# Patient Record
Sex: Female | Born: 1950 | Race: White | Hispanic: No | Smoking: Current every day smoker
Health system: Southern US, Community
[De-identification: ages and names within clinical notes are randomized; demographics above are authoritative.]

## PROBLEM LIST (undated history)

## (undated) DIAGNOSIS — F32A Depression, unspecified: Secondary | ICD-10-CM

## (undated) DIAGNOSIS — H269 Unspecified cataract: Secondary | ICD-10-CM

## (undated) HISTORY — PX: ABDOMINAL HYSTERECTOMY: SHX81

## (undated) HISTORY — DX: Unspecified cataract: H26.9

## (undated) HISTORY — PX: CHOLECYSTECTOMY: SHX55

---

## 2006-02-19 ENCOUNTER — Ambulatory Visit: Payer: Self-pay

## 2009-10-13 ENCOUNTER — Emergency Department: Payer: Self-pay | Admitting: Emergency Medicine

## 2010-10-10 ENCOUNTER — Emergency Department: Payer: Self-pay | Admitting: Emergency Medicine

## 2020-12-28 ENCOUNTER — Inpatient Hospital Stay
Admission: EM | Admit: 2020-12-28 | Discharge: 2021-01-03 | DRG: 640 | Disposition: A | Discharge status: Home / Self Care | Payer: Medicare Other | Attending: Internal Medicine | Admitting: Internal Medicine

## 2020-12-28 ENCOUNTER — Encounter: Payer: Self-pay | Admitting: Emergency Medicine

## 2020-12-28 ENCOUNTER — Emergency Department: Payer: Medicare Other

## 2020-12-28 DIAGNOSIS — Z20822 Contact with and (suspected) exposure to covid-19: Secondary | ICD-10-CM | POA: Diagnosis present

## 2020-12-28 DIAGNOSIS — Z9049 Acquired absence of other specified parts of digestive tract: Secondary | ICD-10-CM

## 2020-12-28 DIAGNOSIS — E878 Other disorders of electrolyte and fluid balance, not elsewhere classified: Principal | ICD-10-CM

## 2020-12-28 DIAGNOSIS — F17210 Nicotine dependence, cigarettes, uncomplicated: Secondary | ICD-10-CM | POA: Diagnosis present

## 2020-12-28 DIAGNOSIS — Z5320 Procedure and treatment not carried out because of patient's decision for unspecified reasons: Secondary | ICD-10-CM | POA: Diagnosis present

## 2020-12-28 DIAGNOSIS — E871 Hypo-osmolality and hyponatremia: Secondary | ICD-10-CM | POA: Diagnosis present

## 2020-12-28 DIAGNOSIS — R251 Tremor, unspecified: Secondary | ICD-10-CM | POA: Diagnosis present

## 2020-12-28 DIAGNOSIS — F332 Major depressive disorder, recurrent severe without psychotic features: Secondary | ICD-10-CM | POA: Diagnosis not present

## 2020-12-28 DIAGNOSIS — E43 Unspecified severe protein-calorie malnutrition: Secondary | ICD-10-CM | POA: Diagnosis present

## 2020-12-28 DIAGNOSIS — Z8673 Personal history of transient ischemic attack (TIA), and cerebral infarction without residual deficits: Secondary | ICD-10-CM

## 2020-12-28 DIAGNOSIS — F419 Anxiety disorder, unspecified: Secondary | ICD-10-CM | POA: Diagnosis present

## 2020-12-28 DIAGNOSIS — R627 Adult failure to thrive: Secondary | ICD-10-CM | POA: Diagnosis present

## 2020-12-28 DIAGNOSIS — Z90710 Acquired absence of both cervix and uterus: Secondary | ICD-10-CM

## 2020-12-28 DIAGNOSIS — R778 Other specified abnormalities of plasma proteins: Secondary | ICD-10-CM | POA: Diagnosis present

## 2020-12-28 DIAGNOSIS — R296 Repeated falls: Secondary | ICD-10-CM | POA: Diagnosis present

## 2020-12-28 DIAGNOSIS — R64 Cachexia: Secondary | ICD-10-CM | POA: Diagnosis present

## 2020-12-28 DIAGNOSIS — R54 Age-related physical debility: Secondary | ICD-10-CM | POA: Diagnosis present

## 2020-12-28 DIAGNOSIS — R7989 Other specified abnormal findings of blood chemistry: Secondary | ICD-10-CM | POA: Diagnosis present

## 2020-12-28 DIAGNOSIS — F32A Depression, unspecified: Secondary | ICD-10-CM | POA: Diagnosis present

## 2020-12-28 DIAGNOSIS — E875 Hyperkalemia: Secondary | ICD-10-CM | POA: Diagnosis present

## 2020-12-28 DIAGNOSIS — E876 Hypokalemia: Secondary | ICD-10-CM | POA: Diagnosis present

## 2020-12-28 DIAGNOSIS — F22 Delusional disorders: Secondary | ICD-10-CM | POA: Diagnosis present

## 2020-12-28 DIAGNOSIS — W19XXXA Unspecified fall, initial encounter: Secondary | ICD-10-CM | POA: Diagnosis present

## 2020-12-28 DIAGNOSIS — F1010 Alcohol abuse, uncomplicated: Secondary | ICD-10-CM | POA: Diagnosis present

## 2020-12-28 DIAGNOSIS — I6930 Unspecified sequelae of cerebral infarction: Secondary | ICD-10-CM

## 2020-12-28 HISTORY — DX: Depression, unspecified: F32.A

## 2020-12-28 LAB — COMPREHENSIVE METABOLIC PANEL
ALT: 33 U/L (ref 0–44)
AST: 63 U/L — ABNORMAL HIGH (ref 15–41)
Albumin: 4.1 g/dL (ref 3.5–5.0)
Alkaline Phosphatase: 111 U/L (ref 38–126)
Anion gap: 14 (ref 5–15)
BUN: 6 mg/dL — ABNORMAL LOW (ref 8–23)
CO2: 26 mmol/L (ref 22–32)
Calcium: 8.9 mg/dL (ref 8.9–10.3)
Chloride: 82 mmol/L — ABNORMAL LOW (ref 98–111)
Creatinine, Ser: 0.63 mg/dL (ref 0.44–1.00)
GFR, Estimated: >60 mL/min (ref >60)
Glucose, Bld: 174 mg/dL — ABNORMAL HIGH (ref 70–99)
Potassium: <2 mmol/L — CL (ref 3.5–5.1)
Sodium: 122 mmol/L — ABNORMAL LOW (ref 135–145)
Total Bilirubin: 1.2 mg/dL (ref 0.3–1.2)
Total Protein: 7.6 g/dL (ref 6.5–8.1)

## 2020-12-28 LAB — CBC
HCT: 39.7 % (ref 36.0–46.0)
Hemoglobin: 14 g/dL (ref 12.0–15.0)
MCH: 32.9 pg (ref 26.0–34.0)
MCHC: 35.3 g/dL (ref 30.0–36.0)
MCV: 93.4 fL (ref 80.0–100.0)
Platelets: 271 10*3/uL (ref 150–400)
RBC: 4.25 MIL/uL (ref 3.87–5.11)
RDW: 11.9 % (ref 11.5–15.5)
WBC: 8.7 10*3/uL (ref 4.0–10.5)
nRBC: 0 % (ref 0.0–0.2)

## 2020-12-28 LAB — ETHANOL: Alcohol, Ethyl (B): <10 mg/dL (ref <10)

## 2020-12-28 LAB — PROTIME-INR
INR: 1 (ref 0.8–1.2)
Prothrombin Time: 12.9 seconds (ref 11.4–15.2)

## 2020-12-28 LAB — MAGNESIUM: Magnesium: 1.1 mg/dL — ABNORMAL LOW (ref 1.7–2.4)

## 2020-12-28 LAB — RESP PANEL BY RT-PCR (FLU A&B, COVID) ARPGX2
Influenza A by PCR: NEGATIVE
Influenza B by PCR: NEGATIVE
SARS Coronavirus 2 by RT PCR: NEGATIVE

## 2020-12-28 LAB — ACETAMINOPHEN LEVEL: Acetaminophen (Tylenol), Serum: <10 ug/mL — ABNORMAL LOW (ref 10–30)

## 2020-12-28 LAB — TSH: TSH: 2.511 u[IU]/mL (ref 0.350–4.500)

## 2020-12-28 LAB — PHOSPHORUS: Phosphorus: 2.3 mg/dL — ABNORMAL LOW (ref 2.5–4.6)

## 2020-12-28 LAB — APTT: aPTT: 34 seconds (ref 24–36)

## 2020-12-28 LAB — SALICYLATE LEVEL: Salicylate Lvl: <7 mg/dL — ABNORMAL LOW (ref 7.0–30.0)

## 2020-12-28 LAB — TROPONIN I (HIGH SENSITIVITY): Troponin I (High Sensitivity): 18 ng/L — ABNORMAL HIGH (ref <18)

## 2020-12-28 MED ORDER — POTASSIUM & SODIUM PHOSPHATES 280-160-250 MG PO PACK
2.0000 | PACK | Freq: Three times a day (TID) | ORAL | Status: AC
  Administered 2020-12-29 (×2): 2 via ORAL
  Filled 2020-12-28 (×2): qty 2

## 2020-12-28 MED ORDER — POTASSIUM CHLORIDE 10 MEQ/100ML IV SOLN
10.0000 meq | INTRAVENOUS | Status: AC
  Administered 2020-12-28 (×2): 10 meq via INTRAVENOUS
  Filled 2020-12-28 (×2): qty 100

## 2020-12-28 MED ORDER — MAGNESIUM SULFATE IN D5W 1-5 GM/100ML-% IV SOLN
1.0000 g | Freq: Once | INTRAVENOUS | Status: AC
  Administered 2020-12-28: 1 g via INTRAVENOUS
  Filled 2020-12-28: qty 100

## 2020-12-28 MED ORDER — SODIUM CHLORIDE 0.9 % IV BOLUS
250.0000 mL | Freq: Once | INTRAVENOUS | Status: AC
  Administered 2020-12-28: 250 mL via INTRAVENOUS

## 2020-12-28 MED ORDER — ACETAMINOPHEN 650 MG RE SUPP
650.0000 mg | Freq: Four times a day (QID) | RECTAL | Status: AC | PRN
  Filled 2020-12-28: qty 1

## 2020-12-28 MED ORDER — ONDANSETRON HCL 4 MG/2ML IJ SOLN: 4.0000 mg | Freq: Four times a day (QID) | INTRAMUSCULAR | Status: DC | PRN

## 2020-12-28 MED ORDER — ONDANSETRON HCL 4 MG PO TABS: 4.0000 mg | ORAL_TABLET | Freq: Four times a day (QID) | ORAL | Status: DC | PRN

## 2020-12-28 MED ORDER — SODIUM PHOSPHATES 45 MMOLE/15ML IV SOLN
15.0000 mmol | Freq: Once | INTRAVENOUS | Status: AC
  Administered 2020-12-29: 15 mmol via INTRAVENOUS
  Filled 2020-12-28: qty 5

## 2020-12-28 MED ORDER — MAGNESIUM SULFATE 2 GM/50ML IV SOLN
2.0000 g | Freq: Once | INTRAVENOUS | Status: AC
  Administered 2020-12-28: 2 g via INTRAVENOUS
  Filled 2020-12-28: qty 50

## 2020-12-28 MED ORDER — SODIUM CHLORIDE 0.9 % IV SOLN: INTRAVENOUS | Status: AC

## 2020-12-28 MED ORDER — LORAZEPAM 2 MG/ML IJ SOLN: 2.0000 mg | INTRAMUSCULAR | Status: AC | PRN

## 2020-12-28 MED ORDER — ACETAMINOPHEN 500 MG PO TABS
1000.0000 mg | ORAL_TABLET | Freq: Four times a day (QID) | ORAL | Status: AC | PRN
  Administered 2020-12-29 – 2020-12-31 (×3): 1000 mg via ORAL
  Filled 2020-12-28 (×3): qty 2

## 2020-12-28 MED ORDER — LORAZEPAM 2 MG/ML IJ SOLN: 2.0000 mg | Freq: Two times a day (BID) | INTRAMUSCULAR | Status: DC | PRN

## 2020-12-28 MED ORDER — HEPARIN SODIUM (PORCINE) 5000 UNIT/ML IJ SOLN
5000.0000 [IU] | Freq: Three times a day (TID) | INTRAMUSCULAR | Status: DC
  Administered 2020-12-29 – 2021-01-02 (×12): 5000 [IU] via SUBCUTANEOUS
  Filled 2020-12-28 (×15): qty 1
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
## 2020-12-29 ENCOUNTER — Encounter: Payer: Self-pay | Admitting: Internal Medicine

## 2020-12-29 DIAGNOSIS — I6930 Unspecified sequelae of cerebral infarction: Secondary | ICD-10-CM

## 2020-12-29 DIAGNOSIS — F419 Anxiety disorder, unspecified: Secondary | ICD-10-CM | POA: Diagnosis not present

## 2020-12-29 DIAGNOSIS — F32A Depression, unspecified: Secondary | ICD-10-CM

## 2020-12-29 DIAGNOSIS — E878 Other disorders of electrolyte and fluid balance, not elsewhere classified: Secondary | ICD-10-CM | POA: Diagnosis not present

## 2020-12-29 DIAGNOSIS — E43 Unspecified severe protein-calorie malnutrition: Secondary | ICD-10-CM | POA: Diagnosis present

## 2020-12-29 DIAGNOSIS — F1010 Alcohol abuse, uncomplicated: Secondary | ICD-10-CM | POA: Diagnosis not present

## 2020-12-29 LAB — URINE DRUG SCREEN, QUALITATIVE (ARMC ONLY)
Amphetamines, Ur Screen: NOT DETECTED
Barbiturates, Ur Screen: NOT DETECTED
Benzodiazepine, Ur Scrn: NOT DETECTED
Cannabinoid 50 Ng, Ur ~~LOC~~: NOT DETECTED
Cocaine Metabolite,Ur ~~LOC~~: NOT DETECTED
MDMA (Ecstasy)Ur Screen: NOT DETECTED
Methadone Scn, Ur: NOT DETECTED
Opiate, Ur Screen: NOT DETECTED
Phencyclidine (PCP) Ur S: NOT DETECTED
Tricyclic, Ur Screen: NOT DETECTED

## 2020-12-29 LAB — BASIC METABOLIC PANEL
Anion gap: 10 (ref 5–15)
Anion gap: 9 (ref 5–15)
Anion gap: 9 (ref 5–15)
BUN: 6 mg/dL — ABNORMAL LOW (ref 8–23)
BUN: <5 mg/dL — ABNORMAL LOW (ref 8–23)
BUN: <5 mg/dL — ABNORMAL LOW (ref 8–23)
CO2: 26 mmol/L (ref 22–32)
CO2: 27 mmol/L (ref 22–32)
CO2: 29 mmol/L (ref 22–32)
Calcium: 7.3 mg/dL — ABNORMAL LOW (ref 8.9–10.3)
Calcium: 7.5 mg/dL — ABNORMAL LOW (ref 8.9–10.3)
Calcium: 8.1 mg/dL — ABNORMAL LOW (ref 8.9–10.3)
Chloride: 86 mmol/L — ABNORMAL LOW (ref 98–111)
Chloride: 90 mmol/L — ABNORMAL LOW (ref 98–111)
Chloride: 91 mmol/L — ABNORMAL LOW (ref 98–111)
Creatinine, Ser: 0.41 mg/dL — ABNORMAL LOW (ref 0.44–1.00)
Creatinine, Ser: 0.5 mg/dL (ref 0.44–1.00)
Creatinine, Ser: 0.61 mg/dL (ref 0.44–1.00)
GFR, Estimated: >60 mL/min (ref >60)
GFR, Estimated: >60 mL/min (ref >60)
GFR, Estimated: >60 mL/min (ref >60)
Glucose, Bld: 122 mg/dL — ABNORMAL HIGH (ref 70–99)
Glucose, Bld: 128 mg/dL — ABNORMAL HIGH (ref 70–99)
Glucose, Bld: 129 mg/dL — ABNORMAL HIGH (ref 70–99)
Potassium: 2.4 mmol/L — CL (ref 3.5–5.1)
Potassium: 2.6 mmol/L — CL (ref 3.5–5.1)
Potassium: 3.2 mmol/L — ABNORMAL LOW (ref 3.5–5.1)
Sodium: 124 mmol/L — ABNORMAL LOW (ref 135–145)
Sodium: 126 mmol/L — ABNORMAL LOW (ref 135–145)
Sodium: 127 mmol/L — ABNORMAL LOW (ref 135–145)

## 2020-12-29 LAB — URINALYSIS, COMPLETE (UACMP) WITH MICROSCOPIC
Bacteria, UA: NONE SEEN
Glucose, UA: NEGATIVE mg/dL
Hgb urine dipstick: NEGATIVE
Ketones, ur: NEGATIVE mg/dL
Nitrite: NEGATIVE
Protein, ur: NEGATIVE mg/dL
Specific Gravity, Urine: 1.01 (ref 1.005–1.030)
pH: 7 (ref 5.0–8.0)

## 2020-12-29 LAB — CBC
HCT: 33.5 % — ABNORMAL LOW (ref 36.0–46.0)
Hemoglobin: 12 g/dL (ref 12.0–15.0)
MCH: 34.1 pg — ABNORMAL HIGH (ref 26.0–34.0)
MCHC: 35.8 g/dL (ref 30.0–36.0)
MCV: 95.2 fL (ref 80.0–100.0)
Platelets: 211 10*3/uL (ref 150–400)
RBC: 3.52 MIL/uL — ABNORMAL LOW (ref 3.87–5.11)
RDW: 12.2 % (ref 11.5–15.5)
WBC: 7.2 10*3/uL (ref 4.0–10.5)
nRBC: 0 % (ref 0.0–0.2)

## 2020-12-29 LAB — VITAMIN B12: Vitamin B-12: 985 pg/mL — ABNORMAL HIGH (ref 180–914)

## 2020-12-29 LAB — LIPID PANEL
Cholesterol: 130 mg/dL (ref 0–200)
HDL: 45 mg/dL (ref >40)
LDL Cholesterol: 67 mg/dL (ref 0–99)
Total CHOL/HDL Ratio: 2.9 RATIO
Triglycerides: 89 mg/dL (ref <150)
VLDL: 18 mg/dL (ref 0–40)

## 2020-12-29 LAB — TROPONIN I (HIGH SENSITIVITY): Troponin I (High Sensitivity): 18 ng/L — ABNORMAL HIGH (ref <18)

## 2020-12-29 MED ORDER — NICOTINE 21 MG/24HR TD PT24
21.0000 mg | MEDICATED_PATCH | Freq: Every day | TRANSDERMAL | Status: DC
  Administered 2020-12-29 – 2021-01-03 (×6): 21 mg via TRANSDERMAL
  Filled 2020-12-29 (×6): qty 1

## 2020-12-29 MED ORDER — POTASSIUM CHLORIDE 10 MEQ/100ML IV SOLN
10.0000 meq | INTRAVENOUS | Status: AC
  Administered 2020-12-29 (×4): 10 meq via INTRAVENOUS
  Filled 2020-12-29 (×4): qty 100

## 2020-12-29 MED ORDER — K PHOS MONO-SOD PHOS DI & MONO 155-852-130 MG PO TABS
500.0000 mg | ORAL_TABLET | ORAL | Status: AC
  Administered 2020-12-29 – 2020-12-30 (×2): 500 mg via ORAL
  Filled 2020-12-29 (×2): qty 2

## 2020-12-29 MED ORDER — CALCIUM CARBONATE 1250 (500 CA) MG PO TABS
1000.0000 mg | ORAL_TABLET | ORAL | Status: AC
  Administered 2020-12-29: 1000 mg via ORAL
  Filled 2020-12-29: qty 1

## 2020-12-29 MED ORDER — MAGNESIUM SULFATE 4 GM/100ML IV SOLN
4.0000 g | Freq: Once | INTRAVENOUS | Status: DC
  Filled 2020-12-29: qty 100

## 2020-12-29 NOTE — Assessment & Plan Note (Signed)
Due to poor p.o. intake.  Dietitian consult

## 2020-12-29 NOTE — Assessment & Plan Note (Signed)
Aggressively replating all electrolytes as needed and recheck

## 2020-12-29 NOTE — ED Notes (Signed)
Report messaged to fidila b rn floor nurse

## 2020-12-29 NOTE — Progress Notes (Signed)
MD notified of critical potassium of 2.4

## 2020-12-29 NOTE — Assessment & Plan Note (Signed)
Appreciate psych input.  Plan for transfer to general psych once the bed is available.  Patient is medically cleared.  Continue Ativan as needed

## 2020-12-29 NOTE — Assessment & Plan Note (Signed)
Replete and recheck ?

## 2020-12-29 NOTE — Consult Note (Signed)
PHARMACY CONSULT NOTE - FOLLOW UP  Pharmacy Consult for Electrolyte Monitoring and Replacement   Recent Labs: Potassium (mmol/L)  Date Value  12/29/2020 2.4 (LL)   Magnesium (mg/dL)  Date Value  40/08/6759 1.1 (L)   Calcium (mg/dL)  Date Value  95/09/3265 7.5 (L)   Albumin (g/dL)  Date Value  12/45/8099 4.1   Phosphorus (mg/dL)  Date Value  83/38/2505 2.3 (L)   Sodium (mmol/L)  Date Value  12/29/2020 127 (L)    Assessment: Patient with PMH relevant to depression, and alcohol abuse. Admitted with generalized weakness and depression. Pharmacy consulted to manage electrolytes. Potassium extremely low at 2.4, Magnesium low at 1.1, phos below goal at 2.3 and Calcium also below goal at 7.5.   Goal of Therapy:  Electrolytes within normal limits.  Plan:  Will replace phos with oral Kphos neutra 500mg  q4hr x 2 doses Kcl IV x 1hr x 6 doses Calcium carbonate tables 1000mg  x 1 Will also order MgSulfate 4gm x 1 Will defer additional sodium management to MD Repeat labs tomorrow morning for further assessment.  Kharter Brew Rodriguez-Guzman PharmD, BCPS 12/29/2020 4:18 PM

## 2020-12-29 NOTE — Assessment & Plan Note (Signed)
Counseled

## 2020-12-29 NOTE — BH Assessment (Signed)
Referral checks:    [facility] [phone]), No answer/ability to leave voicemail.    [facility] [phone]) [phone] Per Morrie Sheldon, pt was denied due to inability to detox and failure to thrive dx.    [place] [phone]), Left a voicemail.   [place] [phone] or [phone]) Left a voicemail.   [address] [phone]), No answer   [facility] [phone] or [phone]), No intake staff available until after 8 AM tomorrow.    [facility] [phone]). No answer  [facility] geropsych beds available at this time.

## 2020-12-29 NOTE — Assessment & Plan Note (Signed)
Due to poor p.o. intake.  Monitor with IV hydration

## 2020-12-29 NOTE — Hospital Course (Addendum)
70 year old female with a known history of depression, alcohol abuse and dependence is admitted for generalized weakness and depression.  She was found to have severe electrolyte imbalances for which she was admitted to medical service  12/1-replacing electrolytes 12/2: Potassium still running low and aggressively being replaced.  Waiting for Geropsych bed once medically clear

## 2020-12-29 NOTE — ED Notes (Signed)
Pt transferred to CPOD via ED tech. Sent with 1 bag of belongings.
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
## 2020-12-29 NOTE — Progress Notes (Signed)
Lab notified of STAT BMP to be collected

## 2020-12-29 NOTE — BH Assessment (Signed)
Referral information for Psychiatric Hospitalization faxed to;   [facility] [phone]),   [facility] [phone]) [phone]  [facility] Pl[facility] [phone]),  [facility] [phone] or [phone])  [address] [phone]),   [facility] [phone] or [phone]),   [facility] [phone]).

## 2020-12-30 ENCOUNTER — Other Ambulatory Visit: Payer: Self-pay

## 2020-12-30 DIAGNOSIS — Z20822 Contact with and (suspected) exposure to covid-19: Secondary | ICD-10-CM | POA: Diagnosis present

## 2020-12-30 DIAGNOSIS — E875 Hyperkalemia: Secondary | ICD-10-CM | POA: Diagnosis present

## 2020-12-30 DIAGNOSIS — F332 Major depressive disorder, recurrent severe without psychotic features: Secondary | ICD-10-CM | POA: Diagnosis present

## 2020-12-30 DIAGNOSIS — E876 Hypokalemia: Secondary | ICD-10-CM | POA: Diagnosis present

## 2020-12-30 DIAGNOSIS — F17210 Nicotine dependence, cigarettes, uncomplicated: Secondary | ICD-10-CM | POA: Diagnosis present

## 2020-12-30 DIAGNOSIS — W19XXXA Unspecified fall, initial encounter: Secondary | ICD-10-CM | POA: Diagnosis present

## 2020-12-30 DIAGNOSIS — R627 Adult failure to thrive: Secondary | ICD-10-CM | POA: Diagnosis present

## 2020-12-30 DIAGNOSIS — Z8673 Personal history of transient ischemic attack (TIA), and cerebral infarction without residual deficits: Secondary | ICD-10-CM | POA: Diagnosis not present

## 2020-12-30 DIAGNOSIS — R64 Cachexia: Secondary | ICD-10-CM | POA: Diagnosis present

## 2020-12-30 DIAGNOSIS — Z5320 Procedure and treatment not carried out because of patient's decision for unspecified reasons: Secondary | ICD-10-CM | POA: Diagnosis present

## 2020-12-30 DIAGNOSIS — I6930 Unspecified sequelae of cerebral infarction: Secondary | ICD-10-CM | POA: Diagnosis not present

## 2020-12-30 DIAGNOSIS — F1010 Alcohol abuse, uncomplicated: Secondary | ICD-10-CM | POA: Diagnosis present

## 2020-12-30 DIAGNOSIS — Z9049 Acquired absence of other specified parts of digestive tract: Secondary | ICD-10-CM | POA: Diagnosis not present

## 2020-12-30 DIAGNOSIS — Z90710 Acquired absence of both cervix and uterus: Secondary | ICD-10-CM | POA: Diagnosis not present

## 2020-12-30 DIAGNOSIS — R296 Repeated falls: Secondary | ICD-10-CM | POA: Diagnosis present

## 2020-12-30 DIAGNOSIS — R54 Age-related physical debility: Secondary | ICD-10-CM | POA: Diagnosis present

## 2020-12-30 DIAGNOSIS — E878 Other disorders of electrolyte and fluid balance, not elsewhere classified: Secondary | ICD-10-CM | POA: Diagnosis present

## 2020-12-30 DIAGNOSIS — F32A Depression, unspecified: Secondary | ICD-10-CM | POA: Diagnosis not present

## 2020-12-30 DIAGNOSIS — F419 Anxiety disorder, unspecified: Secondary | ICD-10-CM | POA: Diagnosis present

## 2020-12-30 DIAGNOSIS — R251 Tremor, unspecified: Secondary | ICD-10-CM | POA: Diagnosis present

## 2020-12-30 DIAGNOSIS — E871 Hypo-osmolality and hyponatremia: Secondary | ICD-10-CM | POA: Diagnosis present

## 2020-12-30 DIAGNOSIS — E43 Unspecified severe protein-calorie malnutrition: Secondary | ICD-10-CM | POA: Diagnosis present

## 2020-12-30 DIAGNOSIS — F22 Delusional disorders: Secondary | ICD-10-CM | POA: Diagnosis present

## 2020-12-30 LAB — BASIC METABOLIC PANEL
Anion gap: 8 (ref 5–15)
Anion gap: 8 (ref 5–15)
BUN: <5 mg/dL — ABNORMAL LOW (ref 8–23)
BUN: <5 mg/dL — ABNORMAL LOW (ref 8–23)
CO2: 27 mmol/L (ref 22–32)
CO2: 27 mmol/L (ref 22–32)
Calcium: 7.6 mg/dL — ABNORMAL LOW (ref 8.9–10.3)
Calcium: 7.7 mg/dL — ABNORMAL LOW (ref 8.9–10.3)
Chloride: 95 mmol/L — ABNORMAL LOW (ref 98–111)
Chloride: 93 mmol/L — ABNORMAL LOW (ref 98–111)
Creatinine, Ser: 0.48 mg/dL (ref 0.44–1.00)
Creatinine, Ser: 0.41 mg/dL — ABNORMAL LOW (ref 0.44–1.00)
GFR, Estimated: >60 mL/min (ref >60)
GFR, Estimated: >60 mL/min (ref >60)
Glucose, Bld: 129 mg/dL — ABNORMAL HIGH (ref 70–99)
Glucose, Bld: 132 mg/dL — ABNORMAL HIGH (ref 70–99)
Potassium: 2.8 mmol/L — ABNORMAL LOW (ref 3.5–5.1)
Potassium: 2.5 mmol/L — CL (ref 3.5–5.1)
Sodium: 130 mmol/L — ABNORMAL LOW (ref 135–145)
Sodium: 128 mmol/L — ABNORMAL LOW (ref 135–145)

## 2020-12-30 LAB — CBC
HCT: 32.3 % — ABNORMAL LOW (ref 36.0–46.0)
Hemoglobin: 11.3 g/dL — ABNORMAL LOW (ref 12.0–15.0)
MCH: 33 pg (ref 26.0–34.0)
MCHC: 35 g/dL (ref 30.0–36.0)
MCV: 94.4 fL (ref 80.0–100.0)
Platelets: 217 10*3/uL (ref 150–400)
RBC: 3.42 MIL/uL — ABNORMAL LOW (ref 3.87–5.11)
RDW: 11.9 % (ref 11.5–15.5)
WBC: 5.2 10*3/uL (ref 4.0–10.5)
nRBC: 0 % (ref 0.0–0.2)

## 2020-12-30 LAB — MAGNESIUM
Magnesium: 1.5 mg/dL — ABNORMAL LOW (ref 1.7–2.4)
Magnesium: 2.2 mg/dL (ref 1.7–2.4)

## 2020-12-30 LAB — PHOSPHORUS
Phosphorus: 2.8 mg/dL (ref 2.5–4.6)
Phosphorus: 2.7 mg/dL (ref 2.5–4.6)

## 2020-12-30 LAB — POTASSIUM: Potassium: 2.7 mmol/L — CL (ref 3.5–5.1)

## 2020-12-30 MED ORDER — POTASSIUM CHLORIDE CRYS ER 20 MEQ PO TBCR
40.0000 meq | EXTENDED_RELEASE_TABLET | Freq: Once | ORAL | Status: AC
  Administered 2020-12-30: 40 meq via ORAL
  Filled 2020-12-30: qty 2

## 2020-12-30 MED ORDER — POTASSIUM CHLORIDE CRYS ER 20 MEQ PO TBCR
40.0000 meq | EXTENDED_RELEASE_TABLET | ORAL | Status: AC
  Administered 2020-12-30 (×2): 40 meq via ORAL
  Filled 2020-12-30 (×2): qty 2

## 2020-12-30 MED ORDER — POTASSIUM CHLORIDE 10 MEQ/100ML IV SOLN
10.0000 meq | INTRAVENOUS | Status: DC
  Filled 2020-12-30 (×3): qty 100

## 2020-12-30 MED ORDER — POTASSIUM CHLORIDE CRYS ER 20 MEQ PO TBCR: 40.0000 meq | EXTENDED_RELEASE_TABLET | Freq: Three times a day (TID) | ORAL | Status: DC

## 2020-12-30 MED ORDER — ENSURE ENLIVE PO LIQD
237.0000 mL | Freq: Two times a day (BID) | ORAL | Status: DC
  Administered 2020-12-30 – 2021-01-03 (×9): 237 mL via ORAL

## 2020-12-30 MED ORDER — FOLIC ACID 1 MG PO TABS
1.0000 mg | ORAL_TABLET | Freq: Every day | ORAL | Status: DC
  Administered 2020-12-30 – 2021-01-03 (×5): 1 mg via ORAL
  Filled 2020-12-30 (×5): qty 1

## 2020-12-30 MED ORDER — CALCIUM CARBONATE 1250 (500 CA) MG PO TABS
1000.0000 mg | ORAL_TABLET | Freq: Once | ORAL | Status: AC
  Administered 2020-12-30: 1000 mg via ORAL
  Filled 2020-12-30 (×2): qty 2

## 2020-12-30 MED ORDER — POTASSIUM CHLORIDE 10 MEQ/100ML IV SOLN
10.0000 meq | INTRAVENOUS | Status: AC
  Administered 2020-12-30: 10 meq via INTRAVENOUS
  Filled 2020-12-30: qty 100

## 2020-12-30 MED ORDER — POTASSIUM CHLORIDE CRYS ER 20 MEQ PO TBCR
40.0000 meq | EXTENDED_RELEASE_TABLET | Freq: Three times a day (TID) | ORAL | Status: DC
  Administered 2020-12-30 – 2020-12-31 (×5): 40 meq via ORAL
  Filled 2020-12-30 (×5): qty 2

## 2020-12-30 MED ORDER — MAGNESIUM SULFATE 2 GM/50ML IV SOLN
2.0000 g | Freq: Once | INTRAVENOUS | Status: AC
  Administered 2020-12-30: 2 g via INTRAVENOUS
  Filled 2020-12-30: qty 50

## 2020-12-30 MED ORDER — POTASSIUM CHLORIDE CRYS ER 20 MEQ PO TBCR
40.0000 meq | EXTENDED_RELEASE_TABLET | Freq: Once | ORAL | Status: AC
  Administered 2020-12-31: 40 meq via ORAL
  Filled 2020-12-30: qty 2

## 2020-12-30 MED ORDER — ADULT MULTIVITAMIN W/MINERALS CH
1.0000 | ORAL_TABLET | Freq: Every day | ORAL | Status: DC
  Administered 2020-12-30 – 2021-01-03 (×5): 1 via ORAL
  Filled 2020-12-30 (×5): qty 1

## 2020-12-30 MED ORDER — THIAMINE HCL 100 MG PO TABS
100.0000 mg | ORAL_TABLET | Freq: Every day | ORAL | Status: DC
  Administered 2020-12-30 – 2021-01-03 (×5): 100 mg via ORAL
  Filled 2020-12-30 (×5): qty 1

## 2020-12-30 NOTE — Assessment & Plan Note (Signed)
Aggressively repleting all electrolytes as needed and recheck.  Pharmacy managing this

## 2020-12-30 NOTE — Assessment & Plan Note (Addendum)
Counseled.  Patient denies any alcohol use at least a week before admission day.

## 2020-12-30 NOTE — Progress Notes (Signed)
  Progress Note    Jill Hayes   HRC:[id number]  DOB: 05/19/1950  DOA: 12/28/2020     0 Date of Service: 12/30/2020   Clinical Course  70 year old female with a known history of depression, alcohol abuse and dependence is admitted for generalized weakness and depression.  She was found to have severe electrolyte imbalances for which she was admitted to medical service  12/1-replacing electrolytes 12/2: Potassium still running low and aggressively being replaced.  Waiting for Geropsych bed once medically clear   Assessment and Plan * Electrolyte abnormality Aggressively repleting all electrolytes as needed and recheck.  Pharmacy managing this  Anxiety and depression Appreciate psych input.  Plan for transfer to general psych once the bed is available.  Patient is medically cleared.  Continue Ativan as needed.  Severe protein-calorie malnutrition (HCC) Due to poor p.o. intake.  Dietitian consult  Hypokalemia Replete and recheck.  Potassium 2.5.  Patient not amenable to take IV potassium as it burns her IV.  Pharmacy is aggressively replating p.o. and slow infusion of IV if she allows  Hyponatremia Due to poor p.o. intake.  Monitor with IV hydration.  Sodium 128  Alcohol abuse Counseled.  Patient denies any alcohol use at least a week before admission day.     Subjective:  Refusing IV potassium as it burns her vein.  Agreeable with oral replacement.  Also agreeable to get transferred to psychiatry floor if need  Objective Vitals:   12/29/20 2211 12/30/20 0200 12/30/20 0330 12/30/20 0727  BP:  132/84 135/83 134/63  Pulse:  (!) 58 63 63  Resp:  18  18  Temp:  98 F (36.7 C) 97.6 F (36.4 C) (!) 97.4 F (36.3 C)  TempSrc:  Oral Oral Oral  SpO2:   100% 100%  Weight: 44.8 kg     Height: 5\' 3"  (1.6 m)      44.8 kg  Vital signs were reviewed and unremarkable.   Exam Physical Exam   Constitutional:appears age-appropriate, frail, cachectic, NAD, calm,  comfortable Eyes:PERRL, lids and conjunctivae normal ENMT:Mucous membranes are moist.Age-appropriate dentition. Hearing appropriate Neck:normal, supple, no masses, no thyromegaly Respiratory:clear to auscultation bilaterally, no wheezing, no crackles. Normal respiratory effort. No accessory muscle use.  Cardiovascular:Regular rate and rhythm, no murmurs / rubs / gallops. No extremity edema. 2+ pedal pulses. No carotid bruits.  Abdomen:Soft, benign Skin:no rashes, lesions, ulcers. No induration Neurologic:Alert and awake, nonfocal Psychiatric:Normal judgment and insight. Alert and oriented x 3. Normal mood.   Labs / Other Information My review of labs, imaging, notes and other tests is significant for Low potassium and magnesium     Disposition Plan: Status is: Inpatient  Remains inpatient appropriate because: Severe electrolyte abnormalities   I tried calling 1 phone number listed in [facility] for her demographic profile but no one answered     Time spent: 35 minutes [facility] 12/30/2020, 3:18 PM

## 2020-12-30 NOTE — Assessment & Plan Note (Signed)
Appreciate psych input.  Plan for transfer to general psych once the bed is available.  Patient is medically cleared.  Continue Ativan as needed 

## 2020-12-30 NOTE — Assessment & Plan Note (Signed)
Due to poor p.o. intake.  Dietitian consult

## 2020-12-30 NOTE — Assessment & Plan Note (Signed)
Due to poor p.o. intake.  Monitor with IV hydration.  Sodium 128

## 2020-12-30 NOTE — Plan of Care (Signed)

## 2020-12-30 NOTE — Consult Note (Addendum)
PHARMACY CONSULT NOTE  Pharmacy Consult for Electrolyte Monitoring and Replacement   Recent Labs: Potassium (mmol/L)  Date Value  12/30/2020 2.8 (L)   Magnesium (mg/dL)  Date Value  44/01/270 1.5 (L)   Calcium (mg/dL)  Date Value  53/66/4403 7.6 (L)   Albumin (g/dL)  Date Value  47/42/5956 4.1   Phosphorus (mg/dL)  Date Value  38/75/6433 2.8   Sodium (mmol/L)  Date Value  12/30/2020 130 (L)   Assessment: Patient is a 70 y/o F with medical history including depression, EtOH use disorder who is admitted with generalized weakness and depression. She was found to have multiple electrolyte derangements. Pharmacy consulted to assist with electrolyte monitoring and replacement as indicated.   Goal of Therapy:  Electrolytes within normal limits  Plan:  --Na 122 >> 130. Suspect solute deficiency in setting of poor nutrition --K 2.8, Kcl 40 mEq PO x 2 and IV Kcl 10 mEq x 4 (120 mEq total) --Mg 1.1 >> 1.5. Will give IV magnesium sulfate 2 g x 1 --Phos 2.3 >> 2.8. Continue to monitor --Calcium 7.6 (last albumin 4.1). Will give 1000 mg elemental calcium in form of calcium carbonate --Will re-check electrolytes (K, Phos, Mg) at 1800 today and again tomorrow morning --Pharmacy will continue to monitor and replace electrolytes as indicated  Update: Per RN, patient refusing IV potassium. Will replace potassium enterally only.   Tressie Ellis 12/30/2020 10:18 AM

## 2020-12-30 NOTE — TOC CM/SW Note (Signed)
Patient recommended for geropsych. Not medically stable yet per MD note. Spoke to [place] with [facility]. Our geropsych unit is currently full.  Charlynn Court, CSW [phone]

## 2020-12-30 NOTE — Care Management Obs Status (Signed)
MEDICARE OBSERVATION STATUS NOTIFICATION   Patient Details  Name: SOLSTICE LASTINGER MRN: [id number] Date of Birth: 11-05-1950   Medicare Observation Status Notification Given:  Yes    Margarito Liner, LCSW 12/30/2020, 9:22 AM

## 2020-12-30 NOTE — Consult Note (Signed)
PHARMACY CONSULT NOTE  Pharmacy Consult for Electrolyte Monitoring and Replacement   Recent Labs: Potassium (mmol/L)  Date Value  12/30/2020 2.7 (LL)   Magnesium (mg/dL)  Date Value  10/27/5745 2.2   Calcium (mg/dL)  Date Value  34/03/7094 7.7 (L)   Albumin (g/dL)  Date Value  43/83/8184 4.1   Phosphorus (mg/dL)  Date Value  03/75/4360 2.7   Sodium (mmol/L)  Date Value  12/30/2020 128 (L)   Assessment: Patient is a 70 y/o F with medical history including depression, EtOH use disorder who is admitted with generalized weakness and depression. She was found to have multiple electrolyte derangements. Pharmacy consulted to assist with electrolyte monitoring and replacement as indicated.   Registered dietician following. Patient is severely malnourished. Monitor closely for refeeding.  Goal of Therapy:  Electrolytes within normal limits  Plan:  --Na 122 >> 130 >> 128. Suspect solute deficiency in setting of poor nutrition --K 2.5>2.7 (12/2 2012; Mg WNL). Continue with scheduled Kcl 40 mEq TID for now. New level reflective of PO. --Will re-check electrolytes (K, Phos, Mg) again tomorrow morning --Pharmacy will continue to monitor and replace electrolytes as indicated  Update: Per RN, patient refusing IV potassium. Will replace potassium enterally only.   Martyn Malay 12/30/2020 10:52 PM

## 2020-12-30 NOTE — Assessment & Plan Note (Signed)
Replete and recheck.  Potassium 2.5.  Patient not amenable to take IV potassium as it burns her IV.  Pharmacy is aggressively replating p.o. and slow infusion of IV if she allows

## 2020-12-30 NOTE — Consult Note (Signed)
PHARMACY CONSULT NOTE  Pharmacy Consult for Electrolyte Monitoring and Replacement   Recent Labs: Potassium (mmol/L)  Date Value  12/30/2020 2.5 (LL)   Magnesium (mg/dL)  Date Value  86/76/7209 1.5 (L)   Calcium (mg/dL)  Date Value  47/09/6281 7.7 (L)   Albumin (g/dL)  Date Value  66/29/4765 4.1   Phosphorus (mg/dL)  Date Value  46/50/3546 2.8   Sodium (mmol/L)  Date Value  12/30/2020 128 (L)   Assessment: Patient is a 70 y/o F with medical history including depression, EtOH use disorder who is admitted with generalized weakness and depression. She was found to have multiple electrolyte derangements. Pharmacy consulted to assist with electrolyte monitoring and replacement as indicated.   Registered dietician following. Patient is severely malnourished. Monitor closely for refeeding.  Goal of Therapy:  Electrolytes within normal limits  Plan:  --Na 122 >> 130 >> 128. Suspect solute deficiency in setting of poor nutrition --K 2.8 >> 2.5, afternoon BMP checked at time of second oral replacement dose so likely not reflected yet. Will schedule Kcl 40 mEq TID for now --Will re-check electrolytes (K, Phos, Mg) at 2000 today and again tomorrow morning --Pharmacy will continue to monitor and replace electrolytes as indicated  Update: Per RN, patient refusing IV potassium. Will replace potassium enterally only.   Tressie Ellis 12/30/2020 3:45 PM

## 2020-12-30 NOTE — Progress Notes (Signed)
Initial Nutrition Assessment  DOCUMENTATION CODES:   Underweight, Severe malnutrition in context of chronic illness  INTERVENTION:   -Magic cup TID with meals, each supplement provides 290 kcal and 9 grams of protein  -Ensure Enlive po BID, each supplement provides 350 kcal and 20 grams of protein  -Downgrade diet to dysphagia 2 (mechanical soft) for ease of intake, as pt with ill fitting dentures  NUTRITION DIAGNOSIS:   Severe Malnutrition related to chronic illness (ETOH abuse) as evidenced by severe fat depletion, severe muscle depletion.  GOAL:   Patient will meet greater than or equal to 90% of their needs  MONITOR:   PO intake, Supplement acceptance, Labs, Weight trends, Skin, I & O's  REASON FOR ASSESSMENT:   Consult Assessment of nutrition requirement/status  ASSESSMENT:   Jill Hayes is a 70 y.o. female with medical history significant for alcohol abuse and dependence, depression, infrequent checkups with primary care provider, who presents emergency department for chief concerns of generalized weakness and depression.  Pt admitted with severe electrolyte imbalance.   Reviewed I/O's: +377 ml x 24 hours  Spoke with pt at bedside, who reports a general decline in health over the past 1-2 months. Pt shares that she does not have an appetite and often can only eat small amounts. Per pt, she only consumed a few bites of oatmeal this morning. Noted meal completions 10-80%.   PTA, pt reports consuming only one meal per day, which consisted of strawberry ice cream. She did not snack throughout the day. Pt shares she drinks a lot of water and occasional soda. Per chart review, she has abstained from alcohol for approximately one week PTA.   Pt shares that she has access to dentures, however, they are ill fitting due to weight loss, so she no longer uses them. Pt also complains that her jaw feels misaligned, which makes it more difficult to eat.   Pt's UBW is around 120#  and she estimates she has lost about 20# over the past 2-3 months. Per CareEverywhere, pt weighed 109# in 07/11/20. Pt has experienced a 10% wt loss over the past 6 months, which is significant for time frame.   Discussed importance of good meal and supplement intake to promote healing. Pt is amenable to diet downgrade for ease of intake, as well as supplements.   Medications reviewed and include calcium carbonate.   Labs reviewed: Na: 130, K: 2.8 (on IV potassium), Mg: 1.5 (on IV supplementation).   NUTRITION - FOCUSED PHYSICAL EXAM:  Flowsheet Row Most Recent Value  Orbital Region Severe depletion  Upper Arm Region Severe depletion  Thoracic and Lumbar Region Severe depletion  Buccal Region Severe depletion  Temple Region Severe depletion  Clavicle Bone Region Severe depletion  Clavicle and Acromion Bone Region Severe depletion  Scapular Bone Region Severe depletion  Dorsal Hand Severe depletion  Patellar Region Severe depletion  Anterior Thigh Region Severe depletion  Posterior Calf Region Severe depletion  Edema (RD Assessment) None  Hair Reviewed  Eyes Reviewed  Mouth Reviewed  Skin Reviewed  Nails Reviewed       Diet Order:   Diet Order             DIET DYS 2 Room service appropriate? Yes; Fluid consistency: Thin  Diet effective now                   EDUCATION NEEDS:   Education needs have been addressed  Skin:  Skin Assessment: Reviewed RN Assessment  Last BM:  12/30/20  Height:   Ht Readings from Last 1 Encounters:  12/29/20 5\' 3"  (1.6 m)    Weight:   Wt Readings from Last 1 Encounters:  12/29/20 44.8 kg    Ideal Body Weight:  52.3 kg  BMI:  Body mass index is 17.5 kg/m.  Estimated Nutritional Needs:   Kcal:  1750-1950  Protein:  90-105 grams  Fluid:  > 1.7 L    14/01/22, RD, LDN, CDCES Registered Dietitian II Certified Diabetes Care and Education Specialist Please refer to [facility] for RD and/or RD on-call/weekend/after hours  pager

## 2020-12-31 DIAGNOSIS — E871 Hypo-osmolality and hyponatremia: Secondary | ICD-10-CM

## 2020-12-31 DIAGNOSIS — E876 Hypokalemia: Secondary | ICD-10-CM

## 2020-12-31 LAB — CBC
HCT: 33.1 % — ABNORMAL LOW (ref 36.0–46.0)
Hemoglobin: 11.7 g/dL — ABNORMAL LOW (ref 12.0–15.0)
MCH: 33.5 pg (ref 26.0–34.0)
MCHC: 35.3 g/dL (ref 30.0–36.0)
MCV: 94.8 fL (ref 80.0–100.0)
Platelets: 245 10*3/uL (ref 150–400)
RBC: 3.49 MIL/uL — ABNORMAL LOW (ref 3.87–5.11)
RDW: 12 % (ref 11.5–15.5)
WBC: 5.8 10*3/uL (ref 4.0–10.5)
nRBC: 0 % (ref 0.0–0.2)

## 2020-12-31 LAB — URINE CULTURE: Culture: >=100000 — AB

## 2020-12-31 LAB — BASIC METABOLIC PANEL
Anion gap: 9 (ref 5–15)
BUN: <5 mg/dL — ABNORMAL LOW (ref 8–23)
CO2: 28 mmol/L (ref 22–32)
Calcium: 8.6 mg/dL — ABNORMAL LOW (ref 8.9–10.3)
Chloride: 93 mmol/L — ABNORMAL LOW (ref 98–111)
Creatinine, Ser: 0.48 mg/dL (ref 0.44–1.00)
GFR, Estimated: >60 mL/min (ref >60)
Glucose, Bld: 120 mg/dL — ABNORMAL HIGH (ref 70–99)
Potassium: 3.4 mmol/L — ABNORMAL LOW (ref 3.5–5.1)
Sodium: 130 mmol/L — ABNORMAL LOW (ref 135–145)

## 2020-12-31 LAB — PHOSPHORUS: Phosphorus: 2.2 mg/dL — ABNORMAL LOW (ref 2.5–4.6)

## 2020-12-31 LAB — MAGNESIUM: Magnesium: 1.8 mg/dL (ref 1.7–2.4)

## 2020-12-31 MED ORDER — LOPERAMIDE HCL 2 MG PO CAPS: 2.0000 mg | ORAL_CAPSULE | Freq: Once | ORAL | Status: DC | PRN

## 2020-12-31 MED ORDER — MAGNESIUM SULFATE 2 GM/50ML IV SOLN
2.0000 g | Freq: Once | INTRAVENOUS | Status: AC
  Administered 2020-12-31: 2 g via INTRAVENOUS
  Filled 2020-12-31: qty 50

## 2020-12-31 MED ORDER — K PHOS MONO-SOD PHOS DI & MONO 155-852-130 MG PO TABS
500.0000 mg | ORAL_TABLET | ORAL | Status: AC
  Administered 2020-12-31 (×4): 500 mg via ORAL
  Filled 2020-12-31 (×4): qty 2

## 2020-12-31 NOTE — TOC CM/SW Note (Addendum)
Reached out to [facility] CSW about bed availability.  Per CSW Calvin no beds today and he recommended reaching out to other hospitals for geri psych placement to see if they will have anything sooner.    Sent out referral to  [facility] -[facility] -[facility] -[place] -[place] -[place] -[place] [facility] -[facility] [facility] [facility] [facility] -[facility] -[facility] Awaiting responses.  Jill Hayes, [place] [phone]

## 2020-12-31 NOTE — Progress Notes (Signed)
PROGRESS NOTE    MY RINKE  IYM:[id number] DOB: 01-Oct-1950 DOA: 12/28/2020 PCP: Pcp, No    Assessment & Plan:   Principal Problem:   Electrolyte abnormality Active Problems:   Elevated troponin   Alcohol abuse   Hyponatremia   Hypokalemia   Chronic cerebrovascular accident (CVA)   Severe protein-calorie malnutrition (HCC)   Anxiety and depression   Hyponatremia: Na is trending up slowing. Will continue to monitor   Hypokalemia: KCl repleated  Depression: severity unknown. Not on any anti-depressant meds as per med rec. Plan for transfer to general psych   Severe protein-calorie malnutrition: continue w/ nutritional supplements    Alcohol abuse: alcohol cessation counseling. Continue on multivitamin, thiamine     DVT prophylaxis: heparin  Code Status: full  Family Communication: Disposition Plan: d/c to inpatient psych   Level of care: Med-Surg  Status is: Inpatient  Remains inpatient appropriate because: medically stable, waiting on inpatient psych bed to be available     Consultants:    Procedures:  Antimicrobials:    Subjective: Pt c/o having a bowel movement after every meal   Objective: Vitals:   12/30/20 0727 12/30/20 2158 12/31/20 0032 12/31/20 0357  BP: 134/63 125/71 128/79 102/70  Pulse: 63 71 77 74  Resp: 18 18 20 18   Temp: (!) 97.4 F (36.3 C) 97.8 F (36.6 C) 98.8 F (37.1 C) 98 F (36.7 C)  TempSrc: Oral Oral Oral Oral  SpO2: 100% 100% 100% 100%  Weight:      Height:        Intake/Output Summary (Last 24 hours) at 12/31/2020 0825 Last data filed at 12/31/2020 0200 Gross per 24 hour  Intake 1163.4 ml  Output --  Net 1163.4 ml   Filed Weights   12/28/20 2018 12/29/20 2211  Weight: 94.2 kg 44.8 kg    Examination:  General exam: Appears calm and comfortable  Respiratory system: Clear to auscultation. Respiratory effort normal. Cardiovascular system: S1 & S2 +. No  rubs, gallops or clicks.Gastrointestinal system:  Abdomen is nondistended, soft and nontender. Normal bowel sounds heard. Central nervous system: Alert and awake. Moves all extremities  Psychiatry: Judgement and insight appear normal. Flat mood and affect    Data Reviewed: I have personally reviewed following labs and imaging studies  CBC: Recent Labs  Lab 12/28/20 1955 12/29/20 0640 12/30/20 0411 12/31/20 0437  WBC 8.7 7.2 5.2 5.8  HGB 14.0 12.0 11.3* 11.7*  HCT 39.7 33.5* 32.3* 33.1*  MCV 93.4 95.2 94.4 94.8  PLT 271 211 217 245   Basic Metabolic Panel: Recent Labs  Lab 12/28/20 1955 12/28/20 2030 12/29/20 0640 12/29/20 1434 12/30/20 0411 12/30/20 1337 12/30/20 2012 12/31/20 0437  NA 122*  --  126* 127* 130* 128*  --  130*  K <2.0*  --  3.2* 2.4* 2.8* 2.5* 2.7* 3.4*  CL 82*  --  90* 91* 95* 93*  --  93*  CO2 26  --  26 27 27 27   --  28  GLUCOSE 174*  --  122* 129* 129* 132*  --  120*  BUN 6*  --  <5* <5* <5* <5*  --  <5*  CREATININE 0.63  --  0.61 0.41* 0.48 0.41*  --  0.48  CALCIUM 8.9  --  7.3* 7.5* 7.6* 7.7*  --  8.6*  MG 1.1*  --   --   --  1.5*  --  2.2 1.8  PHOS  --  2.3*  --   --  2.8  --  2.7 2.2*   GFR: Estimated Creatinine Clearance: 45.6 mL/min (by C-G formula based on SCr of 0.48 mg/dL). Liver Function Tests: Recent Labs  Lab 12/28/20 1955  AST 63*  ALT 33  ALKPHOS 111  BILITOT 1.2  PROT 7.6  ALBUMIN 4.1   No results for input(s): LIPASE, AMYLASE in the last 168 hours. No results for input(s): AMMONIA in the last 168 hours. Coagulation Profile: Recent Labs  Lab 12/28/20 1959  INR 1.0   Cardiac Enzymes: No results for input(s): CKTOTAL, CKMB, CKMBINDEX, TROPONINI in the last 168 hours. BNP (last 3 results) No results for input(s): PROBNP in the last 8760 hours. HbA1C: No results for input(s): HGBA1C in the last 72 hours. CBG: No results for input(s): GLUCAP in the last 168 hours. Lipid Profile: Recent Labs    12/29/20 0640  CHOL 130  HDL 45  LDLCALC 67  TRIG 89  CHOLHDL 2.9    Thyroid Function Tests: Recent Labs    12/28/20 1955  TSH 2.511   Anemia Panel: Recent Labs    12/28/20 2030  VITAMINB12 985*   Sepsis Labs: No results for input(s): PROCALCITON, LATICACIDVEN in the last 168 hours.  Recent Results (from the past 240 hour(s))  Urine Culture     Status: Abnormal   Collection Time: 12/28/20  8:12 PM   Specimen: Urine, Random  Result Value Ref Range Status   Specimen Description   Final    URINE, RANDOM Performed at [facility], [address] [place], [place] [address]    Special Requests   Final    NONE Performed at [facility], [address] [place], [place] [address]    Culture (A)  Final    >=100,000 COLONIES/mL ESCHERICHIA COLI 60,000 COLONIES/mL PSEUDOMONAS AERUGINOSA    Report Status 12/31/2020 FINAL  Final   Organism ID, Bacteria ESCHERICHIA COLI (A)  Final   Organism ID, Bacteria PSEUDOMONAS AERUGINOSA (A)  Final      Susceptibility   Escherichia coli - MIC*    AMPICILLIN 4 SENSITIVE Sensitive     CEFAZOLIN <=4 SENSITIVE Sensitive     CEFEPIME <=0.12 SENSITIVE Sensitive     CEFTRIAXONE <=0.25 SENSITIVE Sensitive     CIPROFLOXACIN <=0.25 SENSITIVE Sensitive     GENTAMICIN <=1 SENSITIVE Sensitive     IMIPENEM <=0.25 SENSITIVE Sensitive     NITROFURANTOIN <=16 SENSITIVE Sensitive     TRIMETH/SULFA >=320 RESISTANT Resistant     AMPICILLIN/SULBACTAM <=2 SENSITIVE Sensitive     PIP/TAZO <=4 SENSITIVE Sensitive     * >=100,000 COLONIES/mL ESCHERICHIA COLI   Pseudomonas aeruginosa - MIC*    CEFTAZIDIME 4 SENSITIVE Sensitive     CIPROFLOXACIN <=0.25 SENSITIVE Sensitive     GENTAMICIN <=1 SENSITIVE Sensitive     IMIPENEM 0.5 SENSITIVE Sensitive     PIP/TAZO 8 SENSITIVE Sensitive     CEFEPIME 2 SENSITIVE Sensitive     * 60,000 COLONIES/mL PSEUDOMONAS AERUGINOSA  Resp Panel by RT-PCR (Flu A&B, Covid) Nasopharyngeal Swab     Status: None   Collection Time: 12/28/20  8:12 PM   Specimen: Nasopharyngeal  Swab; Nasopharyngeal(NP) swabs in vial transport medium  Result Value Ref Range Status   SARS Coronavirus 2 by RT PCR NEGATIVE NEGATIVE Final    Comment: (NOTE) SARS-CoV-2 target nucleic acids are NOT DETECTED.  The SARS-CoV-2 RNA is generally detectable in upper respiratory specimens during the acute phase of infection. The lowest concentration of SARS-CoV-2 viral copies this assay can detect  is 138 copies/mL. A negative result does not preclude SARS-Cov-2 infection and should not be used as the sole basis for treatment or other patient management decisions. A negative result may occur with  improper specimen collection/handling, submission of specimen other than nasopharyngeal swab, presence of viral mutation(s) within the areas targeted by this assay, and inadequate number of viral copies(<138 copies/mL). A negative result must be combined with clinical observations, patient history, and epidemiological information. The expected result is Negative.  Fact Sheet for Patients:  BloggerCourse.com  Fact Sheet for Healthcare Providers:  SeriousBroker.it  This test is no t yet approved or cleared by the [place] FDA and  has been authorized for detection and/or diagnosis of SARS-CoV-2 by FDA under an Emergency Use Authorization (EUA). This EUA will remain  in effect (meaning this test can be used) for the duration of the COVID-19 declaration under Section 564(b)(1) of the Act, 21 U.S.C.section 360bbb-3(b)(1), unless the authorization is terminated  or revoked sooner.       Influenza A by PCR NEGATIVE NEGATIVE Final   Influenza B by PCR NEGATIVE NEGATIVE Final    Comment: (NOTE) The Xpert Xpress SARS-CoV-2/FLU/RSV plus assay is intended as an aid in the diagnosis of influenza from Nasopharyngeal swab specimens and should not be used as a sole basis for treatment. Nasal washings and aspirates are unacceptable for Xpert Xpress  SARS-CoV-2/FLU/RSV testing.  Fact Sheet for Patients: BloggerCourse.com  Fact Sheet for Healthcare Providers: SeriousBroker.it  This test is not yet approved or cleared by the [place] FDA and has been authorized for detection and/or diagnosis of SARS-CoV-2 by FDA under an Emergency Use Authorization (EUA). This EUA will remain in effect (meaning this test can be used) for the duration of the COVID-19 declaration under Section 564(b)(1) of the Act, 21 U.S.C. section 360bbb-3(b)(1), unless the authorization is terminated or revoked.  Performed at [facility], [address], [place], [place] [address]          Radiology Studies: No results found.      Scheduled Meds:  feeding supplement  237 mL Oral BID BM   folic acid  1 mg Oral Daily   heparin  5,000 Units Subcutaneous Q8H   multivitamin with minerals  1 tablet Oral Daily   nicotine  21 mg Transdermal Daily   phosphorus  500 mg Oral Q4H   potassium chloride  40 mEq Oral TID   thiamine  100 mg Oral Daily   Continuous Infusions:  magnesium sulfate bolus IVPB       LOS: 1 day    Time spent: 31 mins     Charise Killian, MD [facility] Pager [phone]  If 7PM-7AM, please contact night-coverage 12/31/2020, 8:25 AM

## 2020-12-31 NOTE — Consult Note (Signed)
PHARMACY CONSULT NOTE  Pharmacy Consult for Electrolyte Monitoring and Replacement   Recent Labs: Potassium (mmol/L)  Date Value  12/31/2020 3.4 (L)   Magnesium (mg/dL)  Date Value  28/76/8115 1.8   Calcium (mg/dL)  Date Value  72/62/0355 8.6 (L)   Albumin (g/dL)  Date Value  97/41/6384 4.1   Phosphorus (mg/dL)  Date Value  53/64/6803 2.2 (L)   Sodium (mmol/L)  Date Value  12/31/2020 130 (L)   Assessment: Patient is a 70 y/o F with medical history including depression, EtOH use disorder who is admitted with generalized weakness and depression. She was found to have multiple electrolyte derangements. Pharmacy consulted to assist with electrolyte monitoring and replacement as indicated.   Registered dietician following. Patient is severely malnourished. Monitor closely for refeeding.  Goal of Therapy:  Electrolytes within normal limits  Plan:  --Na 128>>130. Suspect solute deficiency in setting of poor nutrition --K 2.7 >>3.4 . Continue with scheduled Kcl 40 mEq po TID for now.  --Mag 1.8.  Will replace with Magnesium sulfate 2 gm IV x 1 -- Phos 2.2.  Will replace with KPhos neutral 500 mg PO q4h x 4 doses. --Will re-check electrolytes (K, Phos, Mg) again tomorrow morning --Pharmacy will continue to monitor and replace electrolytes as indicated  Update: Per RN, patient refusing IV potassium. Will replace potassium enterally only.   Theodoros Stjames A 12/31/2020 8:05 AM

## 2021-01-01 DIAGNOSIS — F1010 Alcohol abuse, uncomplicated: Secondary | ICD-10-CM

## 2021-01-01 DIAGNOSIS — F332 Major depressive disorder, recurrent severe without psychotic features: Secondary | ICD-10-CM | POA: Diagnosis present

## 2021-01-01 DIAGNOSIS — E875 Hyperkalemia: Secondary | ICD-10-CM

## 2021-01-01 LAB — BASIC METABOLIC PANEL
Anion gap: 7 (ref 5–15)
BUN: 7 mg/dL — ABNORMAL LOW (ref 8–23)
CO2: 28 mmol/L (ref 22–32)
Calcium: 8.7 mg/dL — ABNORMAL LOW (ref 8.9–10.3)
Chloride: 98 mmol/L (ref 98–111)
Creatinine, Ser: 0.51 mg/dL (ref 0.44–1.00)
GFR, Estimated: >60 mL/min (ref >60)
Glucose, Bld: 103 mg/dL — ABNORMAL HIGH (ref 70–99)
Potassium: 5.2 mmol/L — ABNORMAL HIGH (ref 3.5–5.1)
Sodium: 133 mmol/L — ABNORMAL LOW (ref 135–145)

## 2021-01-01 LAB — CBC
HCT: 32.6 % — ABNORMAL LOW (ref 36.0–46.0)
Hemoglobin: 11.2 g/dL — ABNORMAL LOW (ref 12.0–15.0)
MCH: 33.1 pg (ref 26.0–34.0)
MCHC: 34.4 g/dL (ref 30.0–36.0)
MCV: 96.4 fL (ref 80.0–100.0)
Platelets: 247 10*3/uL (ref 150–400)
RBC: 3.38 MIL/uL — ABNORMAL LOW (ref 3.87–5.11)
RDW: 12.6 % (ref 11.5–15.5)
WBC: 5.8 10*3/uL (ref 4.0–10.5)
nRBC: 0 % (ref 0.0–0.2)

## 2021-01-01 LAB — PHOSPHORUS: Phosphorus: 4.1 mg/dL (ref 2.5–4.6)

## 2021-01-01 LAB — POTASSIUM: Potassium: 4.4 mmol/L (ref 3.5–5.1)

## 2021-01-01 LAB — MAGNESIUM: Magnesium: 1.9 mg/dL (ref 1.7–2.4)

## 2021-01-01 MED ORDER — CITALOPRAM HYDROBROMIDE 20 MG PO TABS
10.0000 mg | ORAL_TABLET | Freq: Every day | ORAL | Status: DC
  Administered 2021-01-01 – 2021-01-03 (×3): 10 mg via ORAL
  Filled 2021-01-01 (×3): qty 1

## 2021-01-01 MED ORDER — SODIUM ZIRCONIUM CYCLOSILICATE 10 G PO PACK
10.0000 g | PACK | Freq: Once | ORAL | Status: AC
  Administered 2021-01-01: 09:00:00 10 g via ORAL
  Filled 2021-01-01: qty 1

## 2021-01-01 MED ORDER — MIRTAZAPINE 15 MG PO TABS
7.5000 mg | ORAL_TABLET | Freq: Every day | ORAL | Status: DC
  Administered 2021-01-01 – 2021-01-03 (×2): 7.5 mg via ORAL
  Filled 2021-01-01 (×2): qty 1

## 2021-01-01 NOTE — Consult Note (Signed)
PHARMACY CONSULT NOTE  Pharmacy Consult for Electrolyte Monitoring and Replacement   Recent Labs: Potassium (mmol/L)  Date Value  01/01/2021 5.2 (H)   Magnesium (mg/dL)  Date Value  86/57/8469 1.9   Calcium (mg/dL)  Date Value  62/95/2841 8.7 (L)   Albumin (g/dL)  Date Value  32/44/0102 4.1   Phosphorus (mg/dL)  Date Value  72/53/6644 4.1   Sodium (mmol/L)  Date Value  01/01/2021 133 (L)   Assessment: Patient is a 70 y/o F with medical history including depression, EtOH use disorder who is admitted with generalized weakness and depression. She was found to have multiple electrolyte derangements. Pharmacy consulted to assist with electrolyte monitoring and replacement as indicated.   Registered dietician following. Patient is severely malnourished. Monitor closely for refeeding.  Goal of Therapy:  Electrolytes within normal limits  Plan:  --Na 128>>130>>133 --K 2.7 >>3.4>>5.2 . Will discontinue the scheduled Kcl 40 mEq po TID .  --Mag 1.9  -- Phos 4.1   --Will re-check electrolytes (K, Phos, Mg) again tomorrow morning --Pharmacy will continue to monitor and replace electrolytes as indicated  Update: Per RN, patient refusing IV potassium. Will replace potassium enterally only.   Baily Hovanec A 01/01/2021 8:15 AM

## 2021-01-01 NOTE — Progress Notes (Signed)
PROGRESS NOTE    Jill Hayes  INO:[id number] DOB: 12-17-1950 DOA: 12/28/2020 PCP: Pcp, No    Assessment & Plan:   Principal Problem:   Electrolyte abnormality Active Problems:   Elevated troponin   Alcohol abuse   Hyponatremia   Hypokalemia   Chronic cerebrovascular accident (CVA)   Severe protein-calorie malnutrition (HCC)   Anxiety and depression   Hyponatremia: Na level continues to trend up, almost WNL  Hypokalemia: resolved  Hyperkalemia: lokelma x 1. Repeat K level ordered   Depression: severity unknown. Not on any anti-depressant meds as per med rec. Plan for transfer to general psych   Severe protein-calorie malnutrition: continue w/ nutritional supplements    Alcohol abuse: received alcohol cessation counseling. Continue on thiamine, multivitamin      DVT prophylaxis: heparin  Code Status: full  Family Communication: Disposition Plan: d/c to inpatient psych   Level of care: Med-Surg  Status is: Inpatient  Remains inpatient appropriate because: medically stable, waiting on inpatient psych bed to be available     Consultants:  Psych   Procedures:  Antimicrobials:    Subjective: Pt c/o fatigue   Objective: Vitals:   12/31/20 1531 12/31/20 2029 01/01/21 0545 01/01/21 0720  BP: 115/70 104/72 113/73 100/63  Pulse: 77 78 79 78  Resp: 16 20 18 18   Temp: (!) 97.5 F (36.4 C) 98.3 F (36.8 C) 97.7 F (36.5 C) 98.1 F (36.7 C)  TempSrc:   Oral Oral  SpO2: 100% 100% 100% 94%  Weight:      Height:        Intake/Output Summary (Last 24 hours) at 01/01/2021 0819 Last data filed at 12/31/2020 1848 Gross per 24 hour  Intake 1130 ml  Output --  Net 1130 ml   Filed Weights   12/28/20 2018 12/29/20 2211  Weight: 94.2 kg 44.8 kg    Examination:  General exam: Appears comfortable. Frail appearing Respiratory system: clear breath sounds b/l  Cardiovascular system: S1/S2+. No rubs or clicks  Gastrointestinal system: Abd is soft, NT, ND &  hypoactive bowel sounds. Central nervous system: alert and awake. Moves all extremities  Psychiatry: judgement and insight appears normal. Flat mood and affect     Data Reviewed: I have personally reviewed following labs and imaging studies  CBC: Recent Labs  Lab 12/28/20 1955 12/29/20 0640 12/30/20 0411 12/31/20 0437 01/01/21 0426  WBC 8.7 7.2 5.2 5.8 5.8  HGB 14.0 12.0 11.3* 11.7* 11.2*  HCT 39.7 33.5* 32.3* 33.1* 32.6*  MCV 93.4 95.2 94.4 94.8 96.4  PLT 271 211 217 245 247   Basic Metabolic Panel: Recent Labs  Lab 12/28/20 1955 12/28/20 2030 12/29/20 0640 12/29/20 1434 12/30/20 0411 12/30/20 1337 12/30/20 2012 12/31/20 0437 01/01/21 0426  NA 122*  --    < > 127* 130* 128*  --  130* 133*  K <2.0*  --    < > 2.4* 2.8* 2.5* 2.7* 3.4* 5.2*  CL 82*  --    < > 91* 95* 93*  --  93* 98  CO2 26  --    < > 27 27 27   --  28 28  GLUCOSE 174*  --    < > 129* 129* 132*  --  120* 103*  BUN 6*  --    < > <5* <5* <5*  --  <5* 7*  CREATININE 0.63  --    < > 0.41* 0.48 0.41*  --  0.48 0.51  CALCIUM 8.9  --    < >  7.5* 7.6* 7.7*  --  8.6* 8.7*  MG 1.1*  --   --   --  1.5*  --  2.2 1.8 1.9  PHOS  --  2.3*  --   --  2.8  --  2.7 2.2* 4.1   < > = values in this interval not displayed.   GFR: Estimated Creatinine Clearance: 45.6 mL/min (by C-G formula based on SCr of 0.51 mg/dL). Liver Function Tests: Recent Labs  Lab 12/28/20 1955  AST 63*  ALT 33  ALKPHOS 111  BILITOT 1.2  PROT 7.6  ALBUMIN 4.1   No results for input(s): LIPASE, AMYLASE in the last 168 hours. No results for input(s): AMMONIA in the last 168 hours. Coagulation Profile: Recent Labs  Lab 12/28/20 1959  INR 1.0   Cardiac Enzymes: No results for input(s): CKTOTAL, CKMB, CKMBINDEX, TROPONINI in the last 168 hours. BNP (last 3 results) No results for input(s): PROBNP in the last 8760 hours. HbA1C: No results for input(s): HGBA1C in the last 72 hours. CBG: No results for input(s): GLUCAP in the last 168  hours. Lipid Profile: No results for input(s): CHOL, HDL, LDLCALC, TRIG, CHOLHDL, LDLDIRECT in the last 72 hours.  Thyroid Function Tests: No results for input(s): TSH, T4TOTAL, FREET4, T3FREE, THYROIDAB in the last 72 hours.  Anemia Panel: No results for input(s): VITAMINB12, FOLATE, FERRITIN, TIBC, IRON, RETICCTPCT in the last 72 hours.  Sepsis Labs: No results for input(s): PROCALCITON, LATICACIDVEN in the last 168 hours.  Recent Results (from the past 240 hour(s))  Urine Culture     Status: Abnormal   Collection Time: 12/28/20  8:12 PM   Specimen: Urine, Random  Result Value Ref Range Status   Specimen Description   Final    URINE, RANDOM Performed at [facility], [address], [place], [place] [address]    Special Requests   Final    NONE Performed at [facility], [address] [place], [place] [address]    Culture (A)  Final    >=100,000 COLONIES/mL ESCHERICHIA COLI 60,000 COLONIES/mL PSEUDOMONAS AERUGINOSA    Report Status 12/31/2020 FINAL  Final   Organism ID, Bacteria ESCHERICHIA COLI (A)  Final   Organism ID, Bacteria PSEUDOMONAS AERUGINOSA (A)  Final      Susceptibility   Escherichia coli - MIC*    AMPICILLIN 4 SENSITIVE Sensitive     CEFAZOLIN <=4 SENSITIVE Sensitive     CEFEPIME <=0.12 SENSITIVE Sensitive     CEFTRIAXONE <=0.25 SENSITIVE Sensitive     CIPROFLOXACIN <=0.25 SENSITIVE Sensitive     GENTAMICIN <=1 SENSITIVE Sensitive     IMIPENEM <=0.25 SENSITIVE Sensitive     NITROFURANTOIN <=16 SENSITIVE Sensitive     TRIMETH/SULFA >=320 RESISTANT Resistant     AMPICILLIN/SULBACTAM <=2 SENSITIVE Sensitive     PIP/TAZO <=4 SENSITIVE Sensitive     * >=100,000 COLONIES/mL ESCHERICHIA COLI   Pseudomonas aeruginosa - MIC*    CEFTAZIDIME 4 SENSITIVE Sensitive     CIPROFLOXACIN <=0.25 SENSITIVE Sensitive     GENTAMICIN <=1 SENSITIVE Sensitive     IMIPENEM 0.5 SENSITIVE Sensitive     PIP/TAZO 8 SENSITIVE Sensitive     CEFEPIME 2  SENSITIVE Sensitive     * 60,000 COLONIES/mL PSEUDOMONAS AERUGINOSA  Resp Panel by RT-PCR (Flu A&B, Covid) Nasopharyngeal Swab     Status: None   Collection Time: 12/28/20  8:12 PM   Specimen: Nasopharyngeal Swab; Nasopharyngeal(NP) swabs in vial transport medium  Result Value Ref Range Status  SARS Coronavirus 2 by RT PCR NEGATIVE NEGATIVE Final    Comment: (NOTE) SARS-CoV-2 target nucleic acids are NOT DETECTED.  The SARS-CoV-2 RNA is generally detectable in upper respiratory specimens during the acute phase of infection. The lowest concentration of SARS-CoV-2 viral copies this assay can detect is 138 copies/mL. A negative result does not preclude SARS-Cov-2 infection and should not be used as the sole basis for treatment or other patient management decisions. A negative result may occur with  improper specimen collection/handling, submission of specimen other than nasopharyngeal swab, presence of viral mutation(s) within the areas targeted by this assay, and inadequate number of viral copies(<138 copies/mL). A negative result must be combined with clinical observations, patient history, and epidemiological information. The expected result is Negative.  Fact Sheet for Patients:  BloggerCourse.com  Fact Sheet for Healthcare Providers:  SeriousBroker.it  This test is no t yet approved or cleared by the [place] FDA and  has been authorized for detection and/or diagnosis of SARS-CoV-2 by FDA under an Emergency Use Authorization (EUA). This EUA will remain  in effect (meaning this test can be used) for the duration of the COVID-19 declaration under Section 564(b)(1) of the Act, 21 U.S.C.section 360bbb-3(b)(1), unless the authorization is terminated  or revoked sooner.       Influenza A by PCR NEGATIVE NEGATIVE Final   Influenza B by PCR NEGATIVE NEGATIVE Final    Comment: (NOTE) The Xpert Xpress SARS-CoV-2/FLU/RSV plus  assay is intended as an aid in the diagnosis of influenza from Nasopharyngeal swab specimens and should not be used as a sole basis for treatment. Nasal washings and aspirates are unacceptable for Xpert Xpress SARS-CoV-2/FLU/RSV testing.  Fact Sheet for Patients: BloggerCourse.com  Fact Sheet for Healthcare Providers: SeriousBroker.it  This test is not yet approved or cleared by the [place] FDA and has been authorized for detection and/or diagnosis of SARS-CoV-2 by FDA under an Emergency Use Authorization (EUA). This EUA will remain in effect (meaning this test can be used) for the duration of the COVID-19 declaration under Section 564(b)(1) of the Act, 21 U.S.C. section 360bbb-3(b)(1), unless the authorization is terminated or revoked.  Performed at [facility], [address] [place], [place] [address]          Radiology Studies: No results found.      Scheduled Meds:  feeding supplement  237 mL Oral BID BM   folic acid  1 mg Oral Daily   heparin  5,000 Units Subcutaneous Q8H   multivitamin with minerals  1 tablet Oral Daily   nicotine  21 mg Transdermal Daily   sodium zirconium cyclosilicate  10 g Oral Once   thiamine  100 mg Oral Daily   Continuous Infusions:     LOS: 2 days    Time spent: 25 mins     Charise Killian, MD [facility] Pager [phone]  If 7PM-7AM, please contact night-coverage 01/01/2021, 8:19 AM
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
## 2021-01-02 DIAGNOSIS — E43 Unspecified severe protein-calorie malnutrition: Secondary | ICD-10-CM

## 2021-01-02 LAB — BASIC METABOLIC PANEL
Anion gap: 9 (ref 5–15)
BUN: 6 mg/dL — ABNORMAL LOW (ref 8–23)
CO2: 29 mmol/L (ref 22–32)
Calcium: 9 mg/dL (ref 8.9–10.3)
Chloride: 98 mmol/L (ref 98–111)
Creatinine, Ser: 0.52 mg/dL (ref 0.44–1.00)
GFR, Estimated: >60 mL/min (ref >60)
Glucose, Bld: 99 mg/dL (ref 70–99)
Potassium: 4.3 mmol/L (ref 3.5–5.1)
Sodium: 136 mmol/L (ref 135–145)

## 2021-01-02 LAB — PHOSPHORUS: Phosphorus: 5 mg/dL — ABNORMAL HIGH (ref 2.5–4.6)

## 2021-01-02 LAB — CBC
HCT: 33.1 % — ABNORMAL LOW (ref 36.0–46.0)
Hemoglobin: 11.3 g/dL — ABNORMAL LOW (ref 12.0–15.0)
MCH: 32.7 pg (ref 26.0–34.0)
MCHC: 34.1 g/dL (ref 30.0–36.0)
MCV: 95.7 fL (ref 80.0–100.0)
Platelets: 282 10*3/uL (ref 150–400)
RBC: 3.46 MIL/uL — ABNORMAL LOW (ref 3.87–5.11)
RDW: 12.9 % (ref 11.5–15.5)
WBC: 5.9 10*3/uL (ref 4.0–10.5)
nRBC: 0 % (ref 0.0–0.2)

## 2021-01-02 LAB — MAGNESIUM: Magnesium: 1.6 mg/dL — ABNORMAL LOW (ref 1.7–2.4)

## 2021-01-02 MED ORDER — MAGNESIUM SULFATE 2 GM/50ML IV SOLN
2.0000 g | Freq: Once | INTRAVENOUS | Status: AC
  Administered 2021-01-02: 2 g via INTRAVENOUS
  Filled 2021-01-02: qty 50

## 2021-01-02 NOTE — Progress Notes (Signed)
PROGRESS NOTE    Jill Hayes  IRS:[id number] DOB: 1950/06/26 DOA: 12/28/2020 PCP: Pcp, No    Assessment & Plan:   Principal Problem:   Electrolyte abnormality Active Problems:   Elevated troponin   Alcohol abuse   Hyponatremia   Hypokalemia   Chronic cerebrovascular accident (CVA)   Severe protein-calorie malnutrition (HCC)   Anxiety and depression   Major depressive disorder, recurrent severe without psychotic features (HCC)   Hyponatremia: resolved   Hypokalemia: resolved  Hyperkalemia: resolved   Hypomagnesemia: Mg sulfate given. Will continue to monitor   Depression: severity unknown. Started on celexa as per psych. Plan to transfer to geri psych as per pysch on 01/01/21  Severe protein-calorie malnutrition: started on remeron and continue nutritional supplements     Alcohol abuse: continue on thiamine, mulitvitamin. Received alcohol cessation counseling       DVT prophylaxis: heparin  Code Status: full  Family Communication: Disposition Plan: d/c to inpatient psych   Level of care: Med-Surg  Status is: Inpatient  Remains inpatient appropriate because: medically stable, waiting on inpatient psych bed to be available     Consultants:  Psych   Procedures:  Antimicrobials:    Subjective: Pt c/o malaise    Objective: Vitals:   01/01/21 0720 01/01/21 2052 01/02/21 0448 01/02/21 0758  BP: 100/63 107/90 120/75 122/66  Pulse: 78 73 77 84  Resp: 18 17 17 16   Temp: 98.1 F (36.7 C) 98.1 F (36.7 C) 98 F (36.7 C) 97.9 F (36.6 C)  TempSrc: Oral   Oral  SpO2: 94% 92% 94% 98%  Weight:      Height:        Intake/Output Summary (Last 24 hours) at 01/02/2021 0828 Last data filed at 01/01/2021 1816 Gross per 24 hour  Intake 1320 ml  Output --  Net 1320 ml   Filed Weights   12/28/20 2018 12/29/20 2211  Weight: 94.2 kg 44.8 kg    Examination:  General exam: appears calm and comfortable. Frail appearing  Respiratory system: clear breath  sounds b/l  Cardiovascular system: S1 & S2+. No rubs or clicks  Gastrointestinal system: Abd is soft, NT, ND & hypoactive bowel sounds  Central nervous system: Alert and oriented. Moves all extremities  Psychiatry: judgement and insight appears normal. Flat mood and affect     Data Reviewed: I have personally reviewed following labs and imaging studies  CBC: Recent Labs  Lab 12/29/20 0640 12/30/20 0411 12/31/20 0437 01/01/21 0426 01/02/21 0546  WBC 7.2 5.2 5.8 5.8 5.9  HGB 12.0 11.3* 11.7* 11.2* 11.3*  HCT 33.5* 32.3* 33.1* 32.6* 33.1*  MCV 95.2 94.4 94.8 96.4 95.7  PLT 211 217 245 247 282   Basic Metabolic Panel: Recent Labs  Lab 12/30/20 0411 12/30/20 1337 12/30/20 2012 12/31/20 0437 01/01/21 0426 01/01/21 1944 01/02/21 0546  NA 130* 128*  --  130* 133*  --  136  K 2.8* 2.5* 2.7* 3.4* 5.2* 4.4 4.3  CL 95* 93*  --  93* 98  --  98  CO2 27 27  --  28 28  --  29  GLUCOSE 129* 132*  --  120* 103*  --  99  BUN <5* <5*  --  <5* 7*  --  6*  CREATININE 0.48 0.41*  --  0.48 0.51  --  0.52  CALCIUM 7.6* 7.7*  --  8.6* 8.7*  --  9.0  MG 1.5*  --  2.2 1.8 1.9  --  1.6*  PHOS  2.8  --  2.7 2.2* 4.1  --  5.0*   GFR: Estimated Creatinine Clearance: 45.6 mL/min (by C-G formula based on SCr of 0.52 mg/dL). Liver Function Tests: Recent Labs  Lab 12/28/20 1955  AST 63*  ALT 33  ALKPHOS 111  BILITOT 1.2  PROT 7.6  ALBUMIN 4.1   No results for input(s): LIPASE, AMYLASE in the last 168 hours. No results for input(s): AMMONIA in the last 168 hours. Coagulation Profile: Recent Labs  Lab 12/28/20 1959  INR 1.0   Cardiac Enzymes: No results for input(s): CKTOTAL, CKMB, CKMBINDEX, TROPONINI in the last 168 hours. BNP (last 3 results) No results for input(s): PROBNP in the last 8760 hours. HbA1C: No results for input(s): HGBA1C in the last 72 hours. CBG: No results for input(s): GLUCAP in the last 168 hours. Lipid Profile: No results for input(s): CHOL, HDL, LDLCALC,  TRIG, CHOLHDL, LDLDIRECT in the last 72 hours.  Thyroid Function Tests: No results for input(s): TSH, T4TOTAL, FREET4, T3FREE, THYROIDAB in the last 72 hours.  Anemia Panel: No results for input(s): VITAMINB12, FOLATE, FERRITIN, TIBC, IRON, RETICCTPCT in the last 72 hours.  Sepsis Labs: No results for input(s): PROCALCITON, LATICACIDVEN in the last 168 hours.  Recent Results (from the past 240 hour(s))  Urine Culture     Status: Abnormal   Collection Time: 12/28/20  8:12 PM   Specimen: Urine, Random  Result Value Ref Range Status   Specimen Description   Final    URINE, RANDOM Performed at [facility], [address] [place], [place] [address]    Special Requests   Final    NONE Performed at [facility], [address] [place], [place] [address]    Culture (A)  Final    >=100,000 COLONIES/mL ESCHERICHIA COLI 60,000 COLONIES/mL PSEUDOMONAS AERUGINOSA    Report Status 12/31/2020 FINAL  Final   Organism ID, Bacteria ESCHERICHIA COLI (A)  Final   Organism ID, Bacteria PSEUDOMONAS AERUGINOSA (A)  Final      Susceptibility   Escherichia coli - MIC*    AMPICILLIN 4 SENSITIVE Sensitive     CEFAZOLIN <=4 SENSITIVE Sensitive     CEFEPIME <=0.12 SENSITIVE Sensitive     CEFTRIAXONE <=0.25 SENSITIVE Sensitive     CIPROFLOXACIN <=0.25 SENSITIVE Sensitive     GENTAMICIN <=1 SENSITIVE Sensitive     IMIPENEM <=0.25 SENSITIVE Sensitive     NITROFURANTOIN <=16 SENSITIVE Sensitive     TRIMETH/SULFA >=320 RESISTANT Resistant     AMPICILLIN/SULBACTAM <=2 SENSITIVE Sensitive     PIP/TAZO <=4 SENSITIVE Sensitive     * >=100,000 COLONIES/mL ESCHERICHIA COLI   Pseudomonas aeruginosa - MIC*    CEFTAZIDIME 4 SENSITIVE Sensitive     CIPROFLOXACIN <=0.25 SENSITIVE Sensitive     GENTAMICIN <=1 SENSITIVE Sensitive     IMIPENEM 0.5 SENSITIVE Sensitive     PIP/TAZO 8 SENSITIVE Sensitive     CEFEPIME 2 SENSITIVE Sensitive     * 60,000 COLONIES/mL PSEUDOMONAS AERUGINOSA  Resp  Panel by RT-PCR (Flu A&B, Covid) Nasopharyngeal Swab     Status: None   Collection Time: 12/28/20  8:12 PM   Specimen: Nasopharyngeal Swab; Nasopharyngeal(NP) swabs in vial transport medium  Result Value Ref Range Status   SARS Coronavirus 2 by RT PCR NEGATIVE NEGATIVE Final    Comment: (NOTE) SARS-CoV-2 target nucleic acids are NOT DETECTED.  The SARS-CoV-2 RNA is generally detectable in upper respiratory specimens during the acute phase of infection. The lowest concentration of SARS-CoV-2 viral copies  this assay can detect is 138 copies/mL. A negative result does not preclude SARS-Cov-2 infection and should not be used as the sole basis for treatment or other patient management decisions. A negative result may occur with  improper specimen collection/handling, submission of specimen other than nasopharyngeal swab, presence of viral mutation(s) within the areas targeted by this assay, and inadequate number of viral copies(<138 copies/mL). A negative result must be combined with clinical observations, patient history, and epidemiological information. The expected result is Negative.  Fact Sheet for Patients:  BloggerCourse.com  Fact Sheet for Healthcare Providers:  SeriousBroker.it  This test is no t yet approved or cleared by the [place] FDA and  has been authorized for detection and/or diagnosis of SARS-CoV-2 by FDA under an Emergency Use Authorization (EUA). This EUA will remain  in effect (meaning this test can be used) for the duration of the COVID-19 declaration under Section 564(b)(1) of the Act, 21 U.S.C.section 360bbb-3(b)(1), unless the authorization is terminated  or revoked sooner.       Influenza A by PCR NEGATIVE NEGATIVE Final   Influenza B by PCR NEGATIVE NEGATIVE Final    Comment: (NOTE) The Xpert Xpress SARS-CoV-2/FLU/RSV plus assay is intended as an aid in the diagnosis of influenza from Nasopharyngeal  swab specimens and should not be used as a sole basis for treatment. Nasal washings and aspirates are unacceptable for Xpert Xpress SARS-CoV-2/FLU/RSV testing.  Fact Sheet for Patients: BloggerCourse.com  Fact Sheet for Healthcare Providers: SeriousBroker.it  This test is not yet approved or cleared by the [place] FDA and has been authorized for detection and/or diagnosis of SARS-CoV-2 by FDA under an Emergency Use Authorization (EUA). This EUA will remain in effect (meaning this test can be used) for the duration of the COVID-19 declaration under Section 564(b)(1) of the Act, 21 U.S.C. section 360bbb-3(b)(1), unless the authorization is terminated or revoked.  Performed at [facility], [address] [place], [place] [address]          Radiology Studies: No results found.      Scheduled Meds:  citalopram  10 mg Oral Daily   feeding supplement  237 mL Oral BID BM   folic acid  1 mg Oral Daily   heparin  5,000 Units Subcutaneous Q8H   mirtazapine  7.5 mg Oral QHS   multivitamin with minerals  1 tablet Oral Daily   nicotine  21 mg Transdermal Daily   thiamine  100 mg Oral Daily   Continuous Infusions:     LOS: 3 days    Time spent: 15 mins     Charise Killian, MD [facility] Pager [phone]  If 7PM-7AM, please contact night-coverage 01/02/2021, 8:28 AM

## 2021-01-02 NOTE — Consult Note (Signed)
PHARMACY CONSULT NOTE  Pharmacy Consult for Electrolyte Monitoring and Replacement   Recent Labs: Potassium (mmol/L)  Date Value  01/02/2021 4.3   Magnesium (mg/dL)  Date Value  50/53/9767 1.6 (L)   Calcium (mg/dL)  Date Value  34/19/3790 9.0   Albumin (g/dL)  Date Value  24/09/7351 4.1   Phosphorus (mg/dL)  Date Value  29/92/4268 5.0 (H)   Sodium (mmol/L)  Date Value  01/02/2021 136   Assessment: Patient is a 70 y/o F with medical history including depression, EtOH use disorder who is admitted with generalized weakness and depression. She was found to have multiple electrolyte derangements. Pharmacy consulted to assist with electrolyte monitoring and replacement as indicated.   Registered dietician following. Patient is severely malnourished. Monitor closely for refeeding.  Per RN, patient refusing IV potassium. Replace potassium enterally only.   Goal of Therapy:  Electrolytes within normal limits  Plan:  --Na 136 --K 4.3 --Mag 1.6 >> Magnesium sulfate 2 g IV ordered by MD  --Phos 5.0   --Will re-check electrolytes (K, Phos, Mg) again tomorrow morning --Pharmacy will continue to monitor and replace electrolytes as indicated   Derrek Gu, PharmD 01/02/2021 8:38 AM

## 2021-01-03 LAB — CBC
HCT: 32.3 % — ABNORMAL LOW (ref 36.0–46.0)
Hemoglobin: 11 g/dL — ABNORMAL LOW (ref 12.0–15.0)
MCH: 32.8 pg (ref 26.0–34.0)
MCHC: 34.1 g/dL (ref 30.0–36.0)
MCV: 96.4 fL (ref 80.0–100.0)
Platelets: 268 10*3/uL (ref 150–400)
RBC: 3.35 MIL/uL — ABNORMAL LOW (ref 3.87–5.11)
RDW: 13.2 % (ref 11.5–15.5)
WBC: 6.5 10*3/uL (ref 4.0–10.5)
nRBC: 0 % (ref 0.0–0.2)

## 2021-01-03 LAB — BASIC METABOLIC PANEL
Anion gap: 6 (ref 5–15)
BUN: 10 mg/dL (ref 8–23)
CO2: 30 mmol/L (ref 22–32)
Calcium: 9.2 mg/dL (ref 8.9–10.3)
Chloride: 96 mmol/L — ABNORMAL LOW (ref 98–111)
Creatinine, Ser: 0.58 mg/dL (ref 0.44–1.00)
GFR, Estimated: >60 mL/min (ref >60)
Glucose, Bld: 111 mg/dL — ABNORMAL HIGH (ref 70–99)
Potassium: 4.3 mmol/L (ref 3.5–5.1)
Sodium: 132 mmol/L — ABNORMAL LOW (ref 135–145)

## 2021-01-03 LAB — MAGNESIUM: Magnesium: 2.1 mg/dL (ref 1.7–2.4)

## 2021-01-03 LAB — PHOSPHORUS: Phosphorus: 4.7 mg/dL — ABNORMAL HIGH (ref 2.5–4.6)

## 2021-01-03 MED ORDER — CITALOPRAM HYDROBROMIDE 10 MG PO TABS: 10.0000 mg | ORAL_TABLET | Freq: Every day | ORAL | 0 refills | Status: DC

## 2021-01-03 MED ORDER — MIRTAZAPINE 7.5 MG PO TABS: 7.5000 mg | ORAL_TABLET | Freq: Every day | ORAL | 0 refills | Status: DC

## 2021-01-03 NOTE — Plan of Care (Signed)

## 2021-01-03 NOTE — Progress Notes (Signed)
AVS given and reviewed with pt. Medications discussed. All questions answered to satisfaction. Pt verbalized understanding of information given. Pt to be escorted off the unit with all belongings via wheelchair by staff member.  

## 2021-01-03 NOTE — Care Management (Signed)
Provided patient with outpatient psych listed.  Encourage patient to call her insurance company to determine a provider in network

## 2021-01-03 NOTE — Consult Note (Signed)
PHARMACY CONSULT NOTE  Pharmacy Consult for Electrolyte Monitoring and Replacement   Recent Labs: Potassium (mmol/L)  Date Value  01/03/2021 4.3   Magnesium (mg/dL)  Date Value  63/14/9702 2.1   Calcium (mg/dL)  Date Value  63/78/5885 9.2   Albumin (g/dL)  Date Value  02/77/4128 4.1   Phosphorus (mg/dL)  Date Value  78/67/6720 4.7 (H)   Sodium (mmol/L)  Date Value  01/03/2021 132 (L)   Assessment: Patient is a 70 y/o F with medical history including depression, EtOH use disorder who is admitted with generalized weakness and depression. She was found to have multiple electrolyte derangements. Pharmacy consulted to assist with electrolyte monitoring and replacement as indicated.   Registered dietician following. Patient is severely malnourished. Monitor closely for refeeding.  Per RN, patient refusing IV potassium. Replace potassium enterally only.   Goal of Therapy:  Electrolytes within normal limits  Plan:  --Na 132 --K 4.3 --Mag 1.6 >> 2.1 --Phos 4.7  --Will re-check electrolytes (K, Phos, Mg) again tomorrow morning --Pharmacy will continue to monitor and replace electrolytes as indicated   Derrek Gu, PharmD 01/03/2021 7:19 AM
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
## 2021-01-03 NOTE — Consult Note (Addendum)
[facility] Face-to-Face Psychiatry Consult   Reason for Consult:  depression Referring Physician:  Wilfred Lacy Patient Identification: Jill Hayes MRN:  [id number] Principal Diagnosis: Electrolyte abnormality Diagnosis:  Principal Problem:   Electrolyte abnormality Active Problems:   Elevated troponin   Alcohol abuse   Hyponatremia   Hypokalemia   Chronic cerebrovascular accident (CVA)   Severe protein-calorie malnutrition (HCC)   Anxiety and depression   Major depressive disorder, recurrent severe without psychotic features (HCC)   Total Time spent with patient: 30 minutes  Subjective:  "I do not want to go to the psychiatric unit."  Jill Hayes is a 70 y.o. female patient admitted with multiple falls in the context of alcohol use disorder.  HPI:  Patient seen face-to-face. Chart reviewed. On approach, patient is sitting on the side of her bed. She is polite, A & O x 4. She speaks in linear sentences, euthymic mood. She states that she brought herself into the hospital because she was exhausted and not eating properly. She states that she is feeling "100% better." She is ambulating well, as she demonstrated. Eating and taking meds. No concerns from nursing voiced. Writer asked patient if she believed she had an issue with alcohol, and patient states that she doesn't think so, but is willing to go to counseling and continue on the meds for depression.  Patient likely minimizing effects of alcohol use. Patient states that her partner has visited and they have talked. Patient now denies that he was really "verbally abusive" and she intends to return home with him.  She presents as able to make her decisions independently. She denies suicidal ideations, homicidal ideations. Denies auditory or visual hallucinations or paranoia. Does not appear to be responding to internal stimuli.  Patient is requesting discharge. She does not want to be psychiatrically admitted and does not meet criteria for IVC.    Past Psychiatric History: see previous note  Risk to Self:   Risk to Others:   Prior Inpatient Therapy:   Prior Outpatient Therapy:    Past Medical History:  Past Medical History:  Diagnosis Date   Depression     Past Surgical History:  Procedure Laterality Date   ABDOMINAL HYSTERECTOMY     CHOLECYSTECTOMY     Family History: History reviewed. No pertinent family history. Family Psychiatric  History: see previous note Social History:  Social History   Substance and Sexual Activity  Alcohol Use Yes     Social History   Substance and Sexual Activity  Drug Use Not Currently    Social History   Socioeconomic History   Marital status: Married    Spouse name: Not on file   Number of children: Not on file   Years of education: Not on file   Highest education level: Not on file  Occupational History   Not on file  Tobacco Use   Smoking status: Every Day    Types: Cigarettes   Smokeless tobacco: Never  Substance and Sexual Activity   Alcohol use: Yes   Drug use: Not Currently   Sexual activity: Not on file  Other Topics Concern   Not on file  Social History Narrative   Not on file   Social Determinants of Health   Financial Resource Strain: Not on file  Food Insecurity: Not on file  Transportation Needs: Not on file  Physical Activity: Not on file  Stress: Not on file  Social Connections: Not on file   Additional Social History:  Allergies:  No Known Allergies  Labs:  Results for orders placed or performed during the hospital encounter of 12/28/20 (from the past 48 hour(s))  Potassium     Status: None   Collection Time: 01/01/21  7:44 PM  Result Value Ref Range   Potassium 4.4 3.5 - 5.1 mmol/L    Comment: Performed at [facility], [address], [place], [place] [address]  Basic metabolic panel     Status: Abnormal   Collection Time: 01/02/21  5:46 AM  Result Value Ref Range   Sodium 136 135 - 145 mmol/L   Potassium 4.3 3.5 - 5.1  mmol/L   Chloride 98 98 - 111 mmol/L   CO2 29 22 - 32 mmol/L   Glucose, Bld 99 70 - 99 mg/dL    Comment: Glucose reference range applies only to samples taken after fasting for at least 8 hours.   BUN 6 (L) 8 - 23 mg/dL   Creatinine, Ser 9.44 0.44 - 1.00 mg/dL   Calcium 9.0 8.9 - 96.7 mg/dL   GFR, Estimated >59 >16 mL/min    Comment: (NOTE) Calculated using the CKD-EPI Creatinine Equation (2021)    Anion gap 9 5 - 15    Comment: Performed at [facility], [address] [place], [place] [address]  Magnesium     Status: Abnormal   Collection Time: 01/02/21  5:46 AM  Result Value Ref Range   Magnesium 1.6 (L) 1.7 - 2.4 mg/dL    Comment: Performed at [facility], [address] [place], [place] [address]  Phosphorus     Status: Abnormal   Collection Time: 01/02/21  5:46 AM  Result Value Ref Range   Phosphorus 5.0 (H) 2.5 - 4.6 mg/dL    Comment: Performed at [facility], [address] [place], [place] [address]  CBC     Status: Abnormal   Collection Time: 01/02/21  5:46 AM  Result Value Ref Range   WBC 5.9 4.0 - 10.5 K/uL   RBC 3.46 (L) 3.87 - 5.11 MIL/uL   Hemoglobin 11.3 (L) 12.0 - 15.0 g/dL   HCT 93.9 (L) 03.0 - 09.2 %   MCV 95.7 80.0 - 100.0 fL   MCH 32.7 26.0 - 34.0 pg   MCHC 34.1 30.0 - 36.0 g/dL   RDW 33.0 07.6 - 22.6 %   Platelets 282 150 - 400 K/uL   nRBC 0.0 0.0 - 0.2 %    Comment: Performed at [facility], [address] [place], [place] [address]  CBC     Status: Abnormal   Collection Time: 01/03/21  4:38 AM  Result Value Ref Range   WBC 6.5 4.0 - 10.5 K/uL   RBC 3.35 (L) 3.87 - 5.11 MIL/uL   Hemoglobin 11.0 (L) 12.0 - 15.0 g/dL   HCT 56.2 (L) 56.3 - 89.3 %   MCV 96.4 80.0 - 100.0 fL   MCH 32.8 26.0 - 34.0 pg   MCHC 34.1 30.0 - 36.0 g/dL   RDW 73.4 28.7 - 68.1 %   Platelets 268 150 - 400 K/uL   nRBC 0.0 0.0 - 0.2 %    Comment: Performed at [facility], [address] [place], [place] [address]   Basic metabolic panel     Status: Abnormal   Collection Time: 01/03/21  4:38 AM  Result Value Ref Range   Sodium 132 (L) 135 - 145 mmol/L   Potassium 4.3 3.5 - 5.1 mmol/L   Chloride  96 (L) 98 - 111 mmol/L   CO2 30 22 - 32 mmol/L   Glucose, Bld 111 (H) 70 - 99 mg/dL    Comment: Glucose reference range applies only to samples taken after fasting for at least 8 hours.   BUN 10 8 - 23 mg/dL   Creatinine, Ser 9.60 0.44 - 1.00 mg/dL   Calcium 9.2 8.9 - 45.4 mg/dL   GFR, Estimated >09 >81 mL/min    Comment: (NOTE) Calculated using the CKD-EPI Creatinine Equation (2021)    Anion gap 6 5 - 15    Comment: Performed at [facility], [address], [place], [place] [address]  Magnesium     Status: None   Collection Time: 01/03/21  4:38 AM  Result Value Ref Range   Magnesium 2.1 1.7 - 2.4 mg/dL    Comment: Performed at [facility], [address], [place], [place] [address]  Phosphorus     Status: Abnormal   Collection Time: 01/03/21  4:38 AM  Result Value Ref Range   Phosphorus 4.7 (H) 2.5 - 4.6 mg/dL    Comment: Performed at [facility], [address], [place], [place] [address]    Current Facility-Administered Medications  Medication Dose Route Frequency Provider Last Rate Last Admin   citalopram (CELEXA) tablet 10 mg  10 mg Oral Daily Charm Rings, NP   10 mg at 01/03/21 6578   feeding supplement (ENSURE ENLIVE / ENSURE PLUS) liquid 237 mL  237 mL Oral BID BM Delfino Lovett, MD   237 mL at 01/03/21 4696   folic acid (FOLVITE) tablet 1 mg  1 mg Oral Daily Delfino Lovett, MD   1 mg at 01/03/21 0916   heparin injection 5,000 Units  5,000 Units Subcutaneous Q8H Cox, Amy N, DO   5,000 Units at 01/02/21 2952   loperamide (IMODIUM) capsule 2 mg  2 mg Oral Once PRN Charise Killian, MD       mirtazapine (REMERON) tablet 7.5 mg  7.5 mg Oral QHS Charm Rings, NP   7.5 mg at 01/03/21 0009   multivitamin with minerals tablet 1 tablet  1 tablet Oral Daily Delfino Lovett, MD   1 tablet at 01/03/21 0916   nicotine (NICODERM CQ - dosed in mg/24 hours) patch 21 mg  21 mg Transdermal Daily Manuela Schwartz, NP   21 mg at 01/03/21 0916   ondansetron (ZOFRAN) tablet 4 mg  4 mg Oral Q6H PRN Cox, Amy N, DO       Or   ondansetron (ZOFRAN) injection 4 mg  4 mg Intravenous Q6H PRN Cox, Amy N, DO       thiamine tablet 100 mg  100 mg Oral Daily Delfino Lovett, MD   100 mg at 01/03/21 8413    Musculoskeletal: Strength & Muscle Tone: within normal limits Gait & Station: normal Patient leans: N/A   Psychiatric Specialty Exam:  Presentation  General Appearance: Appropriate for Environment  Eye Contact:Good  Speech:Clear and Coherent  Speech Volume:Normal  Handedness:Right   Mood and Affect  Mood:Euthymic  Affect:Appropriate   Thought Process  Thought Processes:Coherent  Descriptions of Associations:Circumstantial  Orientation:Full (Time, Place and Person)  Thought Content:Logical; WDL  History of Schizophrenia/Schizoaffective disorder:No  Duration of Psychotic Symptoms:No data recorded Hallucinations:Hallucinations: None Ideas of Reference:None  Suicidal Thoughts:Suicidal Thoughts: No Homicidal Thoughts:Homicidal Thoughts: No  Sensorium  Memory:Immediate Good; Recent Good  Judgment:Fair  Insight:Fair   Executive Functions  Concentration:Good  Attention Span:Good  Recall:Good  [employer]  of Knowledge:Fair  Language:Good   Psychomotor Activity  Psychomotor Activity:Psychomotor Activity: Normal  Assets  Assets:Communication Skills; Desire for Improvement; Financial Resources/Insurance; Housing; Intimacy; Resilience   Sleep  Sleep:Sleep: Good  Physical Exam: Physical Exam Vitals and nursing note reviewed.  HENT:     Head: Normocephalic.     Nose: No congestion or rhinorrhea.  Cardiovascular:     Rate and Rhythm: Normal rate.  Pulmonary:     Effort: Pulmonary effort is normal.  Musculoskeletal:        General:  Normal range of motion.     Cervical back: Normal range of motion.  Skin:    General: Skin is dry.  Neurological:     Mental Status: She is alert and oriented to person, place, and time.  Psychiatric:        Attention and Perception: Attention normal.        Mood and Affect: Mood normal.        Speech: Speech normal.        Behavior: Behavior normal. Behavior is cooperative.        Thought Content: Thought content normal. Thought content is not paranoid or delusional. Thought content does not include homicidal or suicidal ideation.        Cognition and Memory: Cognition normal.        Judgment: Judgment normal.   Review of Systems  Reason unable to perform ROS: Patient is inpatient on the medical floor. Due for discharge today.  Psychiatric/Behavioral:  Positive for substance abuse.   All other systems reviewed and are negative. Blood pressure (!) 85/38, pulse 84, temperature 98.2 F (36.8 C), temperature source Oral, resp. rate 18, height 5\' 3"  (1.6 m), weight 44.8 kg, SpO2 98 %. Body mass index is 17.5 kg/m.  Treatment Plan Summary: Plan Patient to be discharged per medical attending, Dr. . Patient does not meet criteria for IVC. She does not wish to be admitted to inpatient psychiatric unit. She has agreed to follow with outpatient. Referred to SW/TOC on the medical unit, who says they can give her a list of providers, but she should also contact her insurance company. Patient should be discharged with the Celexa and Remeron prescriptions that were started in the hospital.   Disposition: Patient does not meet criteria for psychiatric inpatient admission. Supportive therapy provided about ongoing stressors. Discussed crisis plan, support from social network, calling 911, coming to the Emergency Department, and calling Suicide Hotline.  Wilfred Lacy, NP 01/03/2021 11:43 AM

## 2022-03-19 ENCOUNTER — Other Ambulatory Visit: Payer: Self-pay

## 2022-03-19 ENCOUNTER — Emergency Department
Admission: EM | Admit: 2022-03-19 | Discharge: 2022-03-20 | Disposition: A | Discharge status: Home / Self Care | Payer: Medicare PPO | Attending: Emergency Medicine | Admitting: Emergency Medicine

## 2022-03-19 ENCOUNTER — Emergency Department: Payer: Medicare PPO

## 2022-03-19 DIAGNOSIS — R531 Weakness: Secondary | ICD-10-CM | POA: Diagnosis not present

## 2022-03-19 DIAGNOSIS — R4182 Altered mental status, unspecified: Secondary | ICD-10-CM | POA: Insufficient documentation

## 2022-03-19 DIAGNOSIS — I499 Cardiac arrhythmia, unspecified: Secondary | ICD-10-CM | POA: Diagnosis not present

## 2022-03-19 DIAGNOSIS — N3000 Acute cystitis without hematuria: Secondary | ICD-10-CM | POA: Diagnosis not present

## 2022-03-19 DIAGNOSIS — R6889 Other general symptoms and signs: Secondary | ICD-10-CM | POA: Diagnosis not present

## 2022-03-19 DIAGNOSIS — Z743 Need for continuous supervision: Secondary | ICD-10-CM | POA: Diagnosis not present

## 2022-03-19 DIAGNOSIS — R55 Syncope and collapse: Secondary | ICD-10-CM | POA: Diagnosis not present

## 2022-03-19 DIAGNOSIS — R739 Hyperglycemia, unspecified: Secondary | ICD-10-CM | POA: Diagnosis not present

## 2022-03-19 DIAGNOSIS — R42 Dizziness and giddiness: Secondary | ICD-10-CM | POA: Diagnosis not present

## 2022-03-19 LAB — URINALYSIS, ROUTINE W REFLEX MICROSCOPIC
Bilirubin Urine: NEGATIVE
Glucose, UA: NEGATIVE mg/dL
Ketones, ur: 5 mg/dL — AB
Nitrite: NEGATIVE
Protein, ur: 30 mg/dL — AB
Specific Gravity, Urine: 1.013 (ref 1.005–1.030)
WBC, UA: >50 WBC/hpf (ref 0–5)
pH: 7 (ref 5.0–8.0)

## 2022-03-19 LAB — BASIC METABOLIC PANEL
Anion gap: 11 (ref 5–15)
BUN: 11 mg/dL (ref 8–23)
CO2: 27 mmol/L (ref 22–32)
Calcium: 9.2 mg/dL (ref 8.9–10.3)
Chloride: 95 mmol/L — ABNORMAL LOW (ref 98–111)
Creatinine, Ser: 0.67 mg/dL (ref 0.44–1.00)
GFR, Estimated: >60 mL/min (ref >60)
Glucose, Bld: 125 mg/dL — ABNORMAL HIGH (ref 70–99)
Potassium: 3.2 mmol/L — ABNORMAL LOW (ref 3.5–5.1)
Sodium: 133 mmol/L — ABNORMAL LOW (ref 135–145)

## 2022-03-19 LAB — ETHANOL: Alcohol, Ethyl (B): <10 mg/dL

## 2022-03-19 LAB — CBG MONITORING, ED: Glucose-Capillary: 210 mg/dL — ABNORMAL HIGH (ref 70–99)

## 2022-03-19 LAB — CBC
HCT: 39.1 % (ref 36.0–46.0)
Hemoglobin: 12.9 g/dL (ref 12.0–15.0)
MCH: 32.6 pg (ref 26.0–34.0)
MCHC: 33 g/dL (ref 30.0–36.0)
MCV: 98.7 fL (ref 80.0–100.0)
Platelets: 148 10*3/uL — ABNORMAL LOW (ref 150–400)
RBC: 3.96 MIL/uL (ref 3.87–5.11)
RDW: 13.9 % (ref 11.5–15.5)
WBC: 4 10*3/uL (ref 4.0–10.5)
nRBC: 0 % (ref 0.0–0.2)

## 2022-03-19 LAB — TROPONIN I (HIGH SENSITIVITY)
Troponin I (High Sensitivity): 10 ng/L (ref <18)
Troponin I (High Sensitivity): 15 ng/L (ref <18)

## 2022-03-19 MED ORDER — SODIUM CHLORIDE 0.9 % IV BOLUS
1000.0000 mL | Freq: Once | INTRAVENOUS | Status: AC
Start: 2022-03-19 — End: 2022-03-19
  Administered 2022-03-19: 1000 mL via INTRAVENOUS

## 2022-03-19 MED ORDER — SODIUM CHLORIDE 0.9 % IV SOLN
1.0000 g | Freq: Once | INTRAVENOUS | Status: AC
Start: 2022-03-19 — End: 2022-03-19
  Administered 2022-03-19: 1 g via INTRAVENOUS
  Filled 2022-03-19: qty 10

## 2022-03-19 MED ORDER — CEPHALEXIN 500 MG PO CAPS: 500.0000 mg | ORAL_CAPSULE | Freq: Four times a day (QID) | ORAL | 0 refills | Status: AC

## 2022-03-19 NOTE — ED Notes (Signed)
Patient transported to CT 

## 2022-03-19 NOTE — ED Notes (Signed)
BS: 210

## 2022-03-19 NOTE — ED Provider Notes (Signed)
[facility] Provider Note    Event Date/Time   First MD Initiated Contact with Patient 03/19/22 1951     (approximate)   History   Weakness (BIB medic. Onset: 2 days ago, pt rpting weakness and dizziness for past two days.  Denies any previous previous cardiac issues.  ) and Dizziness (BIB medic.  Onset 2 days ago.  Pt rpting dizziness for past 2 days.  Denies LOC, rpts symptoms have become progressively worse )   HPI  Jill Hayes is a 72 y.o. female past medical history significant for alcohol abuse who presents to the emergency department with weakness.  States that she has been feeling somewhat weak over the past 2 days.  Walking in [employer] today and felt like she was going to pass out because she felt weak.  Lowered her self to a seat.  Denies any fever or chills.  No abdominal pain.  Denies any chest pain or shortness of breath.  No history of DVT or PE.  Last drink of alcohol was approximately 1 week ago.  States that she did not hit her head.  Change of urination.  Denies any dysuria.     Physical Exam   Triage Vital Signs: ED Triage Vitals  Enc Vitals Group     BP      Pulse      Resp      Temp      Temp src      SpO2      Weight      Height      Head Circumference      Peak Flow      Pain Score      Pain Loc      Pain Edu?      Excl. in [place]?     Most recent vital signs: Vitals:   03/19/22 2230 03/19/22 2245  BP:    Pulse: 76 74  Resp: 14 18  Temp:    SpO2: 100% 100%    Physical Exam Constitutional:      Appearance: She is well-developed.  HENT:     Head: Atraumatic.  Eyes:     Conjunctiva/sclera: Conjunctivae normal.  Cardiovascular:     Rate and Rhythm: Regular rhythm.  Pulmonary:     Effort: No respiratory distress.  Abdominal:     General: There is no distension.     Tenderness: There is no right CVA tenderness or left CVA tenderness.  Musculoskeletal:        General: Normal range of motion.     Cervical back:  Normal range of motion.  Skin:    General: Skin is warm.     Capillary Refill: Capillary refill takes less than 2 seconds.  Neurological:     Mental Status: She is alert. Mental status is at baseline.     IMPRESSION / MDM / ASSESSMENT AND PLAN / ED COURSE  I reviewed the triage vital signs and the nursing notes.  Differential diagnosis including electrolyte abnormality, dehydration, urinary tract infection, intracranial hemorrhage  EKG  I, Nathaniel Man, the attending physician, personally viewed and interpreted this ECG.   Rate: Normal  Rhythm: Normal sinus  Axis: Normal  Intervals: Normal  ST&T Change: None  No tachycardic or bradycardic dysrhythmias while on cardiac telemetry.  RADIOLOGY I independently reviewed imaging, my interpretation of imaging: CT scan of the head with no signs of intracranial hemorrhage.  Read as no acute findings.  Chronic small  vessel disease.  LABS (all labs ordered are listed, but only abnormal results are displayed) Labs interpreted as -    Labs Reviewed  CBC - Abnormal; Notable for the following components:      Result Value   Platelets 148 (*)    All other components within normal limits  URINALYSIS, ROUTINE W REFLEX MICROSCOPIC - Abnormal; Notable for the following components:   Color, Urine AMBER (*)    APPearance CLOUDY (*)    Hgb urine dipstick SMALL (*)    Ketones, ur 5 (*)    Protein, ur 30 (*)    Leukocytes,Ua LARGE (*)    Bacteria, UA RARE (*)    Non Squamous Epithelial PRESENT (*)    All other components within normal limits  BASIC METABOLIC PANEL - Abnormal; Notable for the following components:   Sodium 133 (*)    Potassium 3.2 (*)    Chloride 95 (*)    Glucose, Bld 125 (*)    All other components within normal limits  CBG MONITORING, ED - Abnormal; Notable for the following components:   Glucose-Capillary 210 (*)    All other components within normal limits  URINE CULTURE  ETHANOL  TROPONIN I (HIGH SENSITIVITY)   TROPONIN I (HIGH SENSITIVITY)    TREATMENT  1 L of IV fluids, IV Rocephin  MDM    Patient found to have a urinary tract infection.  Urine culture added on.  On chart review no history of a resistant urinary tract infection.  Patient was given her first dose of IV Rocephin and a liter of IV fluids in the emergency department.  On reevaluation patient states that she is feeling a little bit better.  Will discharge the patient with a prescription for antibiotics.  States that she will pick it up in the morning.  Patient does not currently have a primary care physician.  Given information to establish care with a primary care physician.  Given return precautions for any worsening symptoms.  Discussed increasing oral hydration.   PROCEDURES:  Critical Care performed: No  Procedures  Patient's presentation is most consistent with acute presentation with potential threat to life or bodily function.   MEDICATIONS ORDERED IN ED: Medications  sodium chloride 0.9 % bolus 1,000 mL (0 mLs Intravenous Stopped 03/19/22 2159)  cefTRIAXone (ROCEPHIN) 1 g in sodium chloride 0.9 % 100 mL IVPB (0 g Intravenous Stopped 03/19/22 2309)    FINAL CLINICAL IMPRESSION(S) / ED DIAGNOSES   Final diagnoses:  Acute cystitis without hematuria  Weakness     Rx / DC Orders   ED Discharge Orders          Ordered    cephALEXin (KEFLEX) 500 MG capsule  4 times daily        03/19/22 2318             Note:  This document was prepared using Dragon voice recognition software and may include unintentional dictation errors.   Nathaniel Man, MD 03/19/22 [phone]

## 2022-03-19 NOTE — Discharge Instructions (Addendum)
You were seen in the emergency department for weakness.  You are found to have a urinary tract infection.  You were given your first dose of antibiotics in the emergency department through your IV.  Start your next dose of oral antibiotics tomorrow morning.  Call to find a primary care physician.  Return to the emergency department if you have worsening weakness, fever or worsening symptoms.

## 2022-03-19 NOTE — ED Triage Notes (Signed)
Pt BIB medic.  Rpts pt has been having weakness and dizziness for past two days, with symptoms becoming progressively worse.  Pt denies LOC.  Pt denies any hx of cardiac, HTN or diabetic concerns.

## 2022-03-19 NOTE — ED Notes (Signed)
UA when able

## 2022-03-20 NOTE — ED Notes (Signed)
Provider approved continued discharge of pt post vitals

## 2022-03-22 LAB — URINE CULTURE: Culture: >=100000 — AB

## 2022-03-23 NOTE — Progress Notes (Signed)
ED Antimicrobial Stewardship Positive Culture Follow Up   Jill Hayes Gabe is an 71 y.o. female who presented to [facility] on 03/19/2022 with a chief complaint of  Chief Complaint  Patient presents with   Weakness    BIB medic. Onset: 2 days ago, pt rpting weakness and dizziness for past two days.  Denies any previous previous cardiac issues.     Dizziness    BIB medic.  Onset 2 days ago.  Pt rpting dizziness for past 2 days.  Denies LOC, rpts symptoms have become progressively worse     Recent Results (from the past 720 hour(s))  Urine Culture (for pregnant, neutropenic or urologic patients or patients with an indwelling urinary catheter)     Status: Abnormal   Collection Time: 03/19/22  8:25 PM   Specimen: Urine, Random  Result Value Ref Range Status   Specimen Description   Final    URINE, RANDOM Performed at [facility], [address], [place], [place] [address]    Special Requests   Final    NONE Performed at [facility], [address] [place], [place] [address]    Culture (A)  Final    >=100,000 COLONIES/mL ESCHERICHIA COLI Confirmed Extended Spectrum Beta-Lactamase Producer (ESBL).  In bloodstream infections from ESBL organisms, carbapenems are preferred over piperacillin/tazobactam. They are shown to have a lower risk of mortality.    Report Status 03/22/2022 FINAL  Final   Organism ID, Bacteria ESCHERICHIA COLI (A)  Final      Susceptibility   Escherichia coli - MIC*    AMPICILLIN >=32 RESISTANT Resistant     CEFAZOLIN >=64 RESISTANT Resistant     CEFTRIAXONE 32 RESISTANT Resistant     CIPROFLOXACIN >=4 RESISTANT Resistant     GENTAMICIN <=1 SENSITIVE Sensitive     IMIPENEM <=0.25 SENSITIVE Sensitive     NITROFURANTOIN <=16 SENSITIVE Sensitive     TRIMETH/SULFA <=20 SENSITIVE Sensitive     AMPICILLIN/SULBACTAM <=2 SENSITIVE Sensitive     * >=100,000 COLONIES/mL ESCHERICHIA COLI    '[x]'$  Treated with cephalexin, organism resistant to prescribed  antimicrobial  New antibiotic prescription: fosfomycin 3 grams po every 72 hours x 2 to [facility], [place]  ED Provider: Kerman Passey, MD  Dallie Piles 03/23/2022, 2:15 PM Clinical Pharmacist

## 2022-03-26 ENCOUNTER — Telehealth: Payer: Self-pay

## 2022-03-26 NOTE — Telephone Encounter (Signed)
        Patient  visited [place] on 2/20     Telephone encounter attempt :  1st  A HIPAA compliant voice message was left requesting a return call.  Instructed patient to call back .    [place] [phone] [address], [place], [place] [address] Phone: [phone] Email: [email or web address]

## 2022-03-27 ENCOUNTER — Telehealth: Payer: Self-pay

## 2022-03-27 NOTE — Telephone Encounter (Signed)
        Patient  visited [place] on 2/20    Telephone encounter attempt :  2nd  A HIPAA compliant voice message was left requesting a return call.  Instructed patient to call back    [place] [phone] [address], [place], [place] [address] Phone: [phone] Email: [email or web address]

## 2022-05-02 NOTE — Progress Notes (Signed)
Altered mental status - possible intoxication

## 2022-05-03 ENCOUNTER — Telehealth: Payer: Self-pay | Admitting: Family Medicine

## 2022-05-04 ENCOUNTER — Ambulatory Visit (INDEPENDENT_AMBULATORY_CARE_PROVIDER_SITE_OTHER): Payer: Medicare HMO | Admitting: Family Medicine

## 2022-05-04 ENCOUNTER — Encounter: Payer: Self-pay | Admitting: Family Medicine

## 2022-05-04 VITALS — BP 118/58 | HR 76 | Ht 63.0 in | Wt 99.2 lb

## 2022-05-04 DIAGNOSIS — E43 Unspecified severe protein-calorie malnutrition: Secondary | ICD-10-CM | POA: Diagnosis not present

## 2022-05-04 DIAGNOSIS — R233 Spontaneous ecchymoses: Secondary | ICD-10-CM | POA: Insufficient documentation

## 2022-05-04 DIAGNOSIS — R030 Elevated blood-pressure reading, without diagnosis of hypertension: Secondary | ICD-10-CM | POA: Insufficient documentation

## 2022-05-04 DIAGNOSIS — F419 Anxiety disorder, unspecified: Secondary | ICD-10-CM | POA: Diagnosis not present

## 2022-05-04 DIAGNOSIS — Z720 Tobacco use: Secondary | ICD-10-CM | POA: Insufficient documentation

## 2022-05-04 DIAGNOSIS — Z7689 Persons encountering health services in other specified circumstances: Secondary | ICD-10-CM | POA: Insufficient documentation

## 2022-05-04 DIAGNOSIS — F32A Anxiety disorder, unspecified: Secondary | ICD-10-CM

## 2022-05-04 DIAGNOSIS — Z87891 Personal history of nicotine dependence: Secondary | ICD-10-CM | POA: Insufficient documentation

## 2022-05-04 DIAGNOSIS — F1010 Alcohol abuse, uncomplicated: Secondary | ICD-10-CM

## 2022-05-04 DIAGNOSIS — Z114 Encounter for screening for human immunodeficiency virus [HIV]: Secondary | ICD-10-CM | POA: Insufficient documentation

## 2022-05-04 DIAGNOSIS — Z1159 Encounter for screening for other viral diseases: Secondary | ICD-10-CM | POA: Diagnosis not present

## 2022-05-04 DIAGNOSIS — F10982 Alcohol use, unspecified with alcohol-induced sleep disorder: Secondary | ICD-10-CM | POA: Insufficient documentation

## 2022-05-04 NOTE — Patient Instructions (Addendum)
It was a pleasure meeting you today!  Welcome to [facility].  I look forward to taking part in your care as your new primary care physician.    Summary of our discussion today:   I have referred your for lung cancer screening. Please lookout for a call to schedule this appointment  We will collect labs today and follow up with you once the results are available  Please use the 3mg  of melatonin that can be purchased over the counter to help with your sleeping. Take this 1 hour before desired bedtime.   You should return to our clinic in 1 month for AWV   Best Wishes,   Dr. Roxan Hockey

## 2022-05-04 NOTE — Progress Notes (Unsigned)
New patient visit   Patient: Jill Hayes   DOB: July 27, 1950   72 y.o. Female  MRN: [id number] Visit Date: 05/04/2022  Today's healthcare provider: Ronnald RampMakiera Simmons-Robinson, MD   No chief complaint on file.  Subjective    Jill Hayes is a 72 y.o. female who presents today as a new patient to establish care.  HPI   Insomnia  She states that she has tried Tylenol PM  She had a sleep medication that made her have an adverse reaction when she would wake up to  She states that she sometimes has 3-6 beers to try and help her sleep, states she does not do this every night   Cataracts: she is planning to have surgery, sees Dr. Rolley SimsHampton with [facility]   Depression: states that it is getting better, states that she lost her mom in one year followed by her sister the next and her long time SO passed last year, she states that time has helped her to feel better, denies taking medication for symptom of depression    Meds: she takes NSAIDS as needed, and a daily vitamin   Alcohol Use  Wants to cut back, dos not feel annoyed, has no guilt just wants to relax, denies eye opener Denies drinking and driving  Drinks before bed to try and help her insomnia  Long time partner started using cocaine and passed last year, he was also heavy drinker   Protein Calorie Malnutrition  Low BMI- reports that she has always been on the smaller side and does not eat much because she does not have heavy appetite   Social hx  Lives alone with two dogs 3 sons and 1 daughter     Past Medical History:  Diagnosis Date   Cataract    Depression    Past Surgical History:  Procedure Laterality Date   ABDOMINAL HYSTERECTOMY     CHOLECYSTECTOMY     No family status information on file.   History reviewed. No pertinent family history. Social History   Socioeconomic History   Marital status: Widowed    Spouse name: Not on file   Number of children: 4   Years of education: Not on file    Highest education level: Not on file  Occupational History   Not on file  Tobacco Use   Smoking status: Every Day    Packs/day: 2.00    Years: 50.00    Additional pack years: 0.00    Total pack years: 100.00    Types: Cigarettes    Start date: 06/29/1972   Smokeless tobacco: Never  Vaping Use   Vaping Use: Never used  Substance and Sexual Activity   Alcohol use: Yes    Alcohol/week: 35.0 standard drinks of alcohol    Types: 35 Cans of beer per week    Comment: counseling   Drug use: Never   Sexual activity: Not Currently  Other Topics Concern   Not on file  Social History Narrative   Patient lives alone and has two dogs    Social Determinants of Health   Financial Resource Strain: Not on file  Food Insecurity: Not on file  Transportation Needs: Not on file  Physical Activity: Not on file  Stress: Not on file  Social Connections: Not on file   Outpatient Medications Prior to Visit  Medication Sig   ibuprofen (ADVIL) 200 MG tablet Take 400-600 mg by mouth every 6 (six) hours as needed for mild pain or  moderate pain.   acetaminophen (TYLENOL) 500 MG tablet Take 500-1,000 mg by mouth every 6 (six) hours as needed for mild pain or moderate pain.   [DISCONTINUED] citalopram (CELEXA) 10 MG tablet Take 1 tablet (10 mg total) by mouth daily.   [DISCONTINUED] mirtazapine (REMERON) 7.5 MG tablet Take 1 tablet (7.5 mg total) by mouth at bedtime.   No facility-administered medications prior to visit.   Allergies  Allergen Reactions   Codeine Other (See Comments)    GI Upset     There is no immunization history on file for this patient.  Health Maintenance  Topic Date Due   Medicare Annual Wellness (AWV)  Never done   COVID-19 Vaccine (1) Never done   Pneumonia Vaccine 56+ Years old (1 of 2 - PCV) Never done   DTaP/Tdap/Td (1 - Tdap) Never done   COLONOSCOPY (Pts 45-42yrs Insurance coverage will need to be confirmed)  Never done   Lung Cancer Screening  Never done    MAMMOGRAM  Never done   Zoster Vaccines- Shingrix (1 of 2) Never done   DEXA SCAN  Never done   INFLUENZA VACCINE  08/30/2022   Hepatitis C Screening  Completed   HPV VACCINES  Aged Out    Patient Care Team: Ronnald Ramp, MD as PCP - General (Family Medicine)  Review of Systems  Eyes:  Positive for photophobia.  Hematological:  Bruises/bleeds easily.       Objective    BP (!) 118/58 (BP Location: Left Arm)   Pulse 76   Ht 5\' 3"  (1.6 m)   Wt 99 lb 3.2 oz (45 kg)   SpO2 90%   BMI 17.57 kg/m    Physical Exam Vitals reviewed.  Constitutional:      General: She is not in acute distress.    Appearance: Normal appearance. She is not ill-appearing, toxic-appearing or diaphoretic.     Comments: Thin, frail appearing elderly female in NAD   Eyes:     Conjunctiva/sclera: Conjunctivae normal.  Neck:     Thyroid: No thyroid mass, thyromegaly or thyroid tenderness.     Vascular: No carotid bruit.  Cardiovascular:     Rate and Rhythm: Normal rate and regular rhythm.     Pulses: Normal pulses.     Heart sounds: Normal heart sounds. No murmur heard.    No friction rub. No gallop.  Pulmonary:     Effort: Pulmonary effort is normal. No respiratory distress.     Breath sounds: Normal breath sounds. No stridor. No wheezing, rhonchi or rales.  Abdominal:     General: Bowel sounds are normal. There is no distension.     Palpations: Abdomen is soft.     Tenderness: There is no abdominal tenderness.  Musculoskeletal:     Right lower leg: No edema.     Left lower leg: No edema.     Comments: Muscle wasting, hypothenar atrophy  Lymphadenopathy:     Cervical: No cervical adenopathy.  Skin:    Findings: No erythema or rash.  Neurological:     Mental Status: She is alert and oriented to person, place, and time.     Depression Screen    05/04/2022    2:22 PM  PHQ 2/9 Scores  PHQ - 2 Score 0  PHQ- 9 Score 1   Results for orders placed or performed in visit on  05/04/22  Comprehensive metabolic panel  Result Value Ref Range   Glucose 106 (H) 70 - 99 mg/dL  BUN 8 8 - 27 mg/dL   Creatinine, Ser 7.82 0.57 - 1.00 mg/dL   eGFR 93 >95 AO/ZHY/8.65   BUN/Creatinine Ratio 12 12 - 28   Sodium 131 (L) 134 - 144 mmol/L   Potassium 4.7 3.5 - 5.2 mmol/L   Chloride 92 (L) 96 - 106 mmol/L   CO2 23 20 - 29 mmol/L   Calcium 10.6 (H) 8.7 - 10.3 mg/dL   Total Protein 7.8 6.0 - 8.5 g/dL   Albumin 4.6 3.8 - 4.8 g/dL   Globulin, Total 3.2 1.5 - 4.5 g/dL   Albumin/Globulin Ratio 1.4 1.2 - 2.2   Bilirubin Total 0.4 0.0 - 1.2 mg/dL   Alkaline Phosphatase 109 44 - 121 IU/L   AST 27 0 - 40 IU/L   ALT 19 0 - 32 IU/L  INR/PT  Result Value Ref Range   INR 1.0 0.9 - 1.2   Prothrombin Time 11.1 9.1 - 12.0 sec  CBC  Result Value Ref Range   WBC 5.5 3.4 - 10.8 x10E3/uL   RBC 4.46 3.77 - 5.28 x10E6/uL   Hemoglobin 14.1 11.1 - 15.9 g/dL   Hematocrit 78.4 69.6 - 46.6 %   MCV 96 79 - 97 fL   MCH 31.6 26.6 - 33.0 pg   MCHC 33.0 31.5 - 35.7 g/dL   RDW 29.5 28.4 - 13.2 %   Platelets 295 150 - 450 x10E3/uL  TSH + free T4  Result Value Ref Range   TSH 2.280 0.450 - 4.500 uIU/mL   Free T4 1.05 0.82 - 1.77 ng/dL  Hepatitis C Antibody  Result Value Ref Range   Hep C Virus Ab Non Reactive Non Reactive  HIV antibody (with reflex)  Result Value Ref Range   HIV Screen 4th Generation wRfx Non Reactive Non Reactive  Phosphorus  Result Value Ref Range   Phosphorus 4.4 (H) 3.0 - 4.3 mg/dL  Magnesium  Result Value Ref Range   Magnesium 1.7 1.6 - 2.3 mg/dL    Assessment & Plan       Return in about 1 month (around 06/03/2022) for AWV .    Problem List Items Addressed This Visit       Other   Alcohol abuse (Chronic)    Chronic  Counseled provided today to decrease alcohol consumption due to negative impact on overall health and sleep  Patient voiced understanding  CMP, INR collected today        Relevant Orders   Comprehensive metabolic panel (Completed)    Severe protein-calorie malnutrition    Chronic  Patient BMI 17.57 today  Will collect CMP, Mag, Phos, TSH, free T4 HIV screening today        Relevant Orders   Comprehensive metabolic panel (Completed)   INR/PT (Completed)   TSH + free T4 (Completed)   Phosphorus   Magnesium   Anxiety and depression    Chronic  Reports symptoms were more situational  She denies taking any medications for anxiety and depression currently       Encounter to establish care - Primary    Welcomed patient to [facility]  Reviewed patient's medical history, medications, surgical and social history Discussed roles and expectations for primary care physician-patient relationship Recommended patient schedule annual preventative examinations        History of smoking greater than 50 pack years    Chronic  Has been smoking 2 PPD for 50 years  Recommended cessation starting by cutting back cigarettes  Patient is agreeable to  lung cancer screening  Referral for chest CT submitted today        Relevant Orders   Ambulatory Referral Lung Cancer Screening [facility]   Encounter for screening for HIV    HIV screening ordered today       Relevant Orders   HIV antibody (with reflex) (Completed)   Need for hepatitis C screening test    Hepatitis C screening ordered today        Relevant Orders   Hepatitis C Antibody (Completed)   Easy bruising    Chronic  Will check CBC, INR/PT      Relevant Orders   INR/PT (Completed)   CBC (Completed)   Tobacco use    Chronic  Recommended cessation  Patient is in pre-contemplation stage at this time      Alcohol-induced insomnia    Chronic  Reports increasing alcohol consumption to help with sleep  Advised patient against this as the alcohol does not help with good quality sleep, patient voiced understanding  Reviewed sleep hygiene with patient encouraging her to sleep in cool dark room without TV or cell phone activity in bed   Recommended that she get out of bed if she is unable to fall asleep within 30 mins             The entirety of the information documented in the History of Present Illness, Review of Systems and Physical Exam were personally obtained by me. Portions of this information were initially documented by Acey LavSha'taria Tyson  . I, Ronnald RampMakiera Simmons-Robinson, MD have reviewed the documentation above for thoroughness and accuracy.      Ronnald RampMakiera Simmons-Robinson, MD  [facility] [phone] (phone) [phone] (fax)  [facility]

## 2022-05-05 LAB — COMPREHENSIVE METABOLIC PANEL
ALT: 19 IU/L (ref 0–32)
AST: 27 IU/L (ref 0–40)
Albumin/Globulin Ratio: 1.4 (ref 1.2–2.2)
Albumin: 4.6 g/dL (ref 3.8–4.8)
Alkaline Phosphatase: 109 IU/L (ref 44–121)
BUN/Creatinine Ratio: 12 (ref 12–28)
BUN: 8 mg/dL (ref 8–27)
Bilirubin Total: 0.4 mg/dL (ref 0.0–1.2)
CO2: 23 mmol/L (ref 20–29)
Calcium: 10.6 mg/dL — ABNORMAL HIGH (ref 8.7–10.3)
Chloride: 92 mmol/L — ABNORMAL LOW (ref 96–106)
Creatinine, Ser: 0.67 mg/dL (ref 0.57–1.00)
Globulin, Total: 3.2 g/dL (ref 1.5–4.5)
Glucose: 106 mg/dL — ABNORMAL HIGH (ref 70–99)
Potassium: 4.7 mmol/L (ref 3.5–5.2)
Sodium: 131 mmol/L — ABNORMAL LOW (ref 134–144)
Total Protein: 7.8 g/dL (ref 6.0–8.5)
eGFR: 93 mL/min/{1.73_m2} (ref >59)

## 2022-05-05 LAB — CBC
Hematocrit: 42.7 % (ref 34.0–46.6)
Hemoglobin: 14.1 g/dL (ref 11.1–15.9)
MCH: 31.6 pg (ref 26.6–33.0)
MCHC: 33 g/dL (ref 31.5–35.7)
MCV: 96 fL (ref 79–97)
Platelets: 295 10*3/uL (ref 150–450)
RBC: 4.46 x10E6/uL (ref 3.77–5.28)
RDW: 12.8 % (ref 11.7–15.4)
WBC: 5.5 10*3/uL (ref 3.4–10.8)

## 2022-05-05 LAB — PROTIME-INR
INR: 1 (ref 0.9–1.2)
Prothrombin Time: 11.1 s (ref 9.1–12.0)

## 2022-05-05 LAB — TSH+FREE T4
Free T4: 1.05 ng/dL (ref 0.82–1.77)
TSH: 2.28 u[IU]/mL (ref 0.450–4.500)

## 2022-05-05 LAB — HEPATITIS C ANTIBODY: Hep C Virus Ab: NONREACTIVE

## 2022-05-05 LAB — PHOSPHORUS: Phosphorus: 4.4 mg/dL — ABNORMAL HIGH (ref 3.0–4.3)

## 2022-05-05 LAB — MAGNESIUM: Magnesium: 1.7 mg/dL (ref 1.6–2.3)

## 2022-05-05 LAB — HIV ANTIBODY (ROUTINE TESTING W REFLEX): HIV Screen 4th Generation wRfx: NONREACTIVE

## 2022-05-06 NOTE — Assessment & Plan Note (Signed)
Chronic  Reports symptoms were more situational  She denies taking any medications for anxiety and depression currently

## 2022-05-06 NOTE — Assessment & Plan Note (Signed)
Hepatitis C screening ordered today  

## 2022-05-06 NOTE — Assessment & Plan Note (Signed)
Chronic  Has been smoking 2 PPD for 50 years  Recommended cessation starting by cutting back cigarettes  Patient is agreeable to lung cancer screening  Referral for chest CT submitted today

## 2022-05-06 NOTE — Assessment & Plan Note (Signed)
Chronic  Counseled provided today to decrease alcohol consumption due to negative impact on overall health and sleep  Patient voiced understanding  CMP, INR collected today

## 2022-05-06 NOTE — Assessment & Plan Note (Signed)
Chronic  Will check CBC, INR/PT

## 2022-05-06 NOTE — Assessment & Plan Note (Signed)
Welcomed patient to [facility]  Reviewed patient's medical history, medications, surgical and social history Discussed roles and expectations for primary care physician-patient relationship Recommended patient schedule annual preventative examinations   

## 2022-05-06 NOTE — Assessment & Plan Note (Signed)
Chronic  Reports increasing alcohol consumption to help with sleep  Advised patient against this as the alcohol does not help with good quality sleep, patient voiced understanding  Reviewed sleep hygiene with patient encouraging her to sleep in cool dark room without TV or cell phone activity in bed  Recommended that she get out of bed if she is unable to fall asleep within 30 mins

## 2022-05-06 NOTE — Assessment & Plan Note (Signed)
HIV screening ordered today  

## 2022-05-06 NOTE — Assessment & Plan Note (Signed)
Chronic  Patient BMI 17.57 today  Will collect CMP, Mag, Phos, TSH, free T4 HIV screening today

## 2022-05-06 NOTE — Assessment & Plan Note (Signed)
Chronic  Recommended cessation  Patient is in pre-contemplation stage at this time

## 2022-06-05 ENCOUNTER — Ambulatory Visit: Payer: Medicare HMO | Admitting: Family Medicine

## 2022-07-05 ENCOUNTER — Ambulatory Visit: Payer: Medicare HMO | Admitting: Family Medicine

## 2022-07-05 NOTE — Progress Notes (Deleted)
      Established patient visit   Patient: Jill Hayes   DOB: [birth year]   72 y.o. Female  MRN: [id number] Visit Date: 07/05/2022  Today's healthcare provider: Ronnald Ramp, MD   No chief complaint on file.  Subjective    HPI  ***  Medications: Outpatient Medications Prior to Visit  Medication Sig   acetaminophen (TYLENOL) 500 MG tablet Take 500-1,000 mg by mouth every 6 (six) hours as needed for mild pain or moderate pain.   ibuprofen (ADVIL) 200 MG tablet Take 400-600 mg by mouth every 6 (six) hours as needed for mild pain or moderate pain.   No facility-administered medications prior to visit.    Review of Systems  {Labs  Heme  Chem  Endocrine  Serology  Results Review (optional):23779}   Objective    There were no vitals taken for this visit. {Show previous vital signs (optional):23777}  Physical Exam  ***  No results found for any visits on 07/05/22.  Assessment & Plan     ***  No follow-ups on file.      {provider attestation***:1}   Ronnald Ramp, MD  [facility] [phone] (phone) [phone] (fax)  [facility]

## 2022-08-21 ENCOUNTER — Ambulatory Visit: Payer: Medicare HMO

## 2022-09-18 ENCOUNTER — Ambulatory Visit (INDEPENDENT_AMBULATORY_CARE_PROVIDER_SITE_OTHER): Payer: Medicare HMO

## 2022-09-18 VITALS — Ht 63.0 in | Wt 99.3 lb

## 2022-09-18 DIAGNOSIS — Z0000 Encounter for general adult medical examination without abnormal findings: Secondary | ICD-10-CM

## 2022-09-18 NOTE — Progress Notes (Addendum)
Subjective:   HOLLEE LAMBERTON is a 72 y.o. female who presents for an Initial Medicare Annual Wellness Visit.  Visit Complete: Virtual  I connected with  Quincy Simmonds on 09/18/22 by a audio enabled telemedicine application and verified that I am speaking with the correct person using two identifiers.  Patient Location: Home  Provider Location: Office/Clinic  I discussed the limitations of evaluation and management by telemedicine. The patient expressed understanding and agreed to proceed.  Vital Signs: Unable to obtain new vitals due to this being a telehealth visit.  Patient Medicare AWV questionnaire was completed by the patient on (not done); I have confirmed that all information answered by patient is correct and no changes since this date.  Review of Systems    Cardiac Risk Factors include: advanced age (>83men, >39 women);sedentary lifestyle;smoking/ tobacco exposure    Objective:    Today's Vitals   09/18/22 1532  Weight: 99 lb 4.8 oz (45 kg)  Height: 5\' 3"  (1.6 m)   Body mass index is 17.59 kg/m.     09/18/2022    3:44 PM 12/30/2020   10:00 AM  Advanced Directives  Does Patient Have a Medical Advance Directive? No No  Would patient like information on creating a medical advance directive?  No - Patient declined    Current Medications (verified) Outpatient Encounter Medications as of 09/18/2022  Medication Sig   acetaminophen (TYLENOL) 500 MG tablet Take 500-1,000 mg by mouth every 6 (six) hours as needed for mild pain or moderate pain.   ibuprofen (ADVIL) 200 MG tablet Take 400-600 mg by mouth every 6 (six) hours as needed for mild pain or moderate pain.   Magnesium 400 MG CAPS Take 2 capsules by mouth 1 day or 1 dose.   Multiple Vitamins-Minerals (CENTRUM ADULT 50+ MULTIGUMMIES) CHEW Chew 1 Dose by mouth 1 day or 1 dose.   No facility-administered encounter medications on file as of 09/18/2022.    Allergies (verified) Codeine   History: Past Medical  History:  Diagnosis Date   Cataract    Depression    Past Surgical History:  Procedure Laterality Date   ABDOMINAL HYSTERECTOMY     CHOLECYSTECTOMY     No family history on file. Social History   Socioeconomic History   Marital status: Widowed    Spouse name: Not on file   Number of children: 4   Years of education: Not on file   Highest education level: Not on file  Occupational History   Not on file  Tobacco Use   Smoking status: Every Day    Current packs/day: 2.00    Average packs/day: 2.0 packs/day for 50.2 years (100.4 ttl pk-yrs)    Types: Cigarettes    Start date: 06/29/1972   Smokeless tobacco: Never  Vaping Use   Vaping status: Never Used  Substance and Sexual Activity   Alcohol use: Yes    Alcohol/week: 35.0 standard drinks of alcohol    Types: 35 Cans of beer per week    Comment: counseling   Drug use: Never   Sexual activity: Not Currently  Other Topics Concern   Not on file  Social History Narrative   Patient lives alone and has two dogs    Social Determinants of Health   Financial Resource Strain: Low Risk  (09/18/2022)   Overall Financial Resource Strain (CARDIA)    Difficulty of Paying Living Expenses: Not hard at all  Food Insecurity: No Food Insecurity (09/18/2022)   Hunger Vital Sign  Worried About Programme researcher, broadcasting/film/video in the Last Year: Never true    Ran Out of Food in the Last Year: Never true  Transportation Needs: No Transportation Needs (09/18/2022)   PRAPARE - Administrator, Civil Service (Medical): No    Lack of Transportation (Non-Medical): No  Physical Activity: Inactive (09/18/2022)   Exercise Vital Sign    Days of Exercise per Week: 0 days    Minutes of Exercise per Session: 0 min  Stress: No Stress Concern Present (09/18/2022)   [employer] of Occupational Health - Occupational Stress Questionnaire    Feeling of Stress : Not at all  Social Connections: Socially Isolated (09/18/2022)   Social Connection and  Isolation Panel [NHANES]    Frequency of Communication with Friends and Family: Twice a week    Frequency of Social Gatherings with Friends and Family: Never    Attends Religious Services: Never    Database administrator or Organizations: No    Attends Banker Meetings: Never    Marital Status: Widowed    Tobacco Counseling Ready to quit: Not Answered Counseling given: Not Answered   Clinical Intake:  Pre-visit preparation completed: Yes  Pain : No/denies pain     BMI - recorded: 17.59 Nutritional Status: BMI <19  Underweight Nutritional Risks: None Diabetes: No  How often do you need to have someone help you when you read instructions, pamphlets, or other written materials from your doctor or pharmacy?: 1 - Never  Interpreter Needed?: No  Comments: lives alone Information entered by :: B.Ulyana Pitones,LPN   Activities of Daily Living    09/18/2022    3:44 PM 05/04/2022    2:22 PM  In your present state of health, do you have any difficulty performing the following activities:  Hearing? 0 0  Vision? 0 1  Difficulty concentrating or making decisions? 0 0  Walking or climbing stairs? 0 1  Dressing or bathing? 0 0  Doing errands, shopping? 0 0  Preparing Food and eating ? N   Using the Toilet? N   In the past six months, have you accidently leaked urine? N   Do you have problems with loss of bowel control? N   Managing your Medications? N   Managing your Finances? N   Housekeeping or managing your Housekeeping? N     Patient Care Team: Ronnald Ramp, MD as PCP - General (Family Medicine) [facility], [facility] (Optometry)  Indicate any recent Medical Services you may have received from other than [facility] providers in the past year (date may be approximate).     Assessment:   This is a routine wellness examination for Taziyah.  Hearing/Vision screen Hearing Screening - Comments:: Adequate hearing Vision Screening - Comments:: Adequate vision  ;readers only [facility]  Dietary issues and exercise activities discussed:     Goals Addressed             This Visit's Progress    Patient Stated       Stay healthy       Depression Screen    09/18/2022    3:41 PM 05/04/2022    2:22 PM  PHQ 2/9 Scores  PHQ - 2 Score 0 0  PHQ- 9 Score  1    Fall Risk    09/18/2022    3:35 PM 05/04/2022    2:22 PM  Fall Risk   Falls in the past year? 0 0  Number falls in past yr: 0  0  Injury with Fall? 0 0  Risk for fall due to : No Fall Risks No Fall Risks  Follow up Education provided;Falls prevention discussed Falls evaluation completed    MEDICARE RISK AT HOME: Medicare Risk at Home Any stairs in or around the home?: Yes If so, are there any without handrails?: Yes Home free of loose throw rugs in walkways, pet beds, electrical cords, etc?: Yes Adequate lighting in your home to reduce risk of falls?: Yes Life alert?: No Use of a cane, walker or w/c?: No Grab bars in the bathroom?: No Shower chair or bench in shower?: No Elevated toilet seat or a handicapped toilet?: No  TIMED UP AND GO:  Was the test performed? No    Cognitive Function:        09/18/2022    3:48 PM  6CIT Screen  What Year? 0 points  What month? 0 points  What time? 0 points  Count back from 20 0 points  Months in reverse 0 points  Repeat phrase 0 points  Total Score 0 points    Immunizations  There is no immunization history on file for this patient.  TDAP status: Due, Education has been provided regarding the importance of this vaccine. Advised may receive this vaccine at local pharmacy or Health Dept. Aware to provide a copy of the vaccination record if obtained from local pharmacy or Health Dept. Verbalized acceptance and understanding.  Flu Vaccine status: Declined, Education has been provided regarding the importance of this vaccine but patient still declined. Advised may receive this vaccine at local pharmacy or Health Dept. Aware to  provide a copy of the vaccination record if obtained from local pharmacy or Health Dept. Verbalized acceptance and understanding.  Pneumococcal vaccine status: Declined,  Education has been provided regarding the importance of this vaccine but patient still declined. Advised may receive this vaccine at local pharmacy or Health Dept. Aware to provide a copy of the vaccination record if obtained from local pharmacy or Health Dept. Verbalized acceptance and understanding.   Covid-19 vaccine status: Declined, Education has been provided regarding the importance of this vaccine but patient still declined. Advised may receive this vaccine at local pharmacy or Health Dept.or vaccine clinic. Aware to provide a copy of the vaccination record if obtained from local pharmacy or Health Dept. Verbalized acceptance and understanding.  Qualifies for Shingles Vaccine? Yes   Zostavax completed No   Shingrix Completed?: No.    Education has been provided regarding the importance of this vaccine. Patient has been advised to call insurance company to determine out of pocket expense if they have not yet received this vaccine. Advised may also receive vaccine at local pharmacy or Health Dept. Verbalized acceptance and understanding.  Screening Tests Health Maintenance  Topic Date Due   Pneumonia Vaccine 47+ Years old (1 of 2 - PCV) Never done   DTaP/Tdap/Td (1 - Tdap) Never done   Colonoscopy  Never done   Lung Cancer Screening  Never done   MAMMOGRAM  Never done   Zoster Vaccines- Shingrix (1 of 2) Never done   DEXA SCAN  Never done   COVID-19 Vaccine (1 - 2023-24 season) Never done   INFLUENZA VACCINE  08/30/2022   Medicare Annual Wellness (AWV)  09/18/2023   Hepatitis C Screening  Completed   HPV VACCINES  Aged Out    Health Maintenance  Health Maintenance Due  Topic Date Due   Pneumonia Vaccine 65+ Years old (1 of 2 - PCV)  Never done   DTaP/Tdap/Td (1 - Tdap) Never done   Colonoscopy  Never done    Lung Cancer Screening  Never done   MAMMOGRAM  Never done   Zoster Vaccines- Shingrix (1 of 2) Never done   DEXA SCAN  Never done   COVID-19 Vaccine (1 - 2023-24 season) Never done   INFLUENZA VACCINE  08/30/2022    Colorectal cancer screening: Type of screening: Colonoscopy. Completed no. Repeat every 5-10 years pt DECLINES  Mammogram status: Completed YES. Repeat every year PT DECLINED  Bone Density status: Completed NO. Results reflect: Bone density results: NORMAL. Repeat every 5 years.  Lung Cancer Screening: (Low Dose CT Chest recommended if Age 78-80 years, 20 pack-year currently smoking OR have quit w/in 15years.) does not qualify.   Lung Cancer Screening Referral: no pt declines  Additional Screening:  Hepatitis C Screening: does not qualify; Completed yes  Vision Screening: Recommended annual ophthalmology exams for early detection of glaucoma and other disorders of the eye. Is the patient up to date with their annual eye exam?  Yes  Who is the provider or what is the name of the office in which the patient attends annual eye exams? [facility] If pt is not established with a provider, would they like to be referred to a provider to establish care? No .   Dental Screening: Recommended annual dental exams for proper oral hygiene  Diabetic Foot Exam: n/a  Community Resource Referral / Chronic Care Management: CRR required this visit?  No   CCM required this visit?  No    Plan:     I have personally reviewed and noted the following in the patient's chart:   Medical and social history Use of alcohol, tobacco or illicit drugs  Current medications and supplements including opioid prescriptions. Patient is not currently taking opioid prescriptions. Functional ability and status Nutritional status Physical activity Advanced directives List of other physicians Hospitalizations, surgeries, and ER visits in previous 12 months Vitals Screenings to include cognitive,  depression, and falls Referrals and appointments  In addition, I have reviewed and discussed with patient certain preventive protocols, quality metrics, and best practice recommendations. A written personalized care plan for preventive services as well as general preventive health recommendations were provided to patient.    Sue Lush, LPN   [id number]   After Visit Summary: (Declined) Due to this being a telephonic visit, with patients personalized plan was offered to patient but patient Declined AVS at this time   Nurse Notes: The patient states she is doing alright with some difficult moments still trying to adjust from the death of her husband 15 years ago. Pt declines most of the screening tests, vaccines and measures as she relays she does not need them. She has no concerns or questions at this time.

## 2022-09-18 NOTE — Patient Instructions (Signed)
Jill Hayes , Thank you for taking time to come for your Medicare Wellness Visit. I appreciate your ongoing commitment to your health goals. Please review the following plan we discussed and let me know if I can assist you in the future.   Referrals/Orders/Follow-Ups/Clinician Recommendations: none  This is a list of the screening recommended for you and due dates:  Health Maintenance  Topic Date Due   Pneumonia Vaccine (1 of 2 - PCV) Never done   DTaP/Tdap/Td vaccine (1 - Tdap) Never done   Colon Cancer Screening  Never done   Screening for Lung Cancer  Never done   Mammogram  Never done   Zoster (Shingles) Vaccine (1 of 2) Never done   DEXA scan (bone density measurement)  Never done   COVID-19 Vaccine (1 - 2023-24 season) Never done   Flu Shot  08/30/2022   Medicare Annual Wellness Visit  09/18/2023   Hepatitis C Screening  Completed   HPV Vaccine  Aged Out    Advanced directives: (Declined) Advance directive discussed with you today. Even though you declined this today, please call our office should you change your mind, and we can give you the proper paperwork for you to fill out.  Next Medicare Annual Wellness Visit scheduled for next year: Yes 09/23/23 @ 3:15pm telephone  Preventive Care 65 Years and Older, Female Preventive care refers to lifestyle choices and visits with your health care provider that can promote health and wellness. What does preventive care include? A yearly physical exam. This is also called an annual well check. Dental exams once or twice a year. Routine eye exams. Ask your health care provider how often you should have your eyes checked. Personal lifestyle choices, including: Daily care of your teeth and gums. Regular physical activity. Eating a healthy diet. Avoiding tobacco and drug use. Limiting alcohol use. Practicing safe sex. Taking low-dose aspirin every day. Taking vitamin and mineral supplements as recommended by your health care  provider. What happens during an annual well check? The services and screenings done by your health care provider during your annual well check will depend on your age, overall health, lifestyle risk factors, and family history of disease. Counseling  Your health care provider may ask you questions about your: Alcohol use. Tobacco use. Drug use. Emotional well-being. Home and relationship well-being. Sexual activity. Eating habits. History of falls. Memory and ability to understand (cognition). Work and work Astronomer. Reproductive health. Screening  You may have the following tests or measurements: Height, weight, and BMI. Blood pressure. Lipid and cholesterol levels. These may be checked every 5 years, or more frequently if you are over 69 years old. Skin check. Lung cancer screening. You may have this screening every year starting at age 78 if you have a 30-pack-year history of smoking and currently smoke or have quit within the past 15 years. Fecal occult blood test (FOBT) of the stool. You may have this test every year starting at age 71. Flexible sigmoidoscopy or colonoscopy. You may have a sigmoidoscopy every 5 years or a colonoscopy every 10 years starting at age 51. Hepatitis C blood test. Hepatitis B blood test. Sexually transmitted disease (STD) testing. Diabetes screening. This is done by checking your blood sugar (glucose) after you have not eaten for a while (fasting). You may have this done every 1-3 years. Bone density scan. This is done to screen for osteoporosis. You may have this done starting at age 10. Mammogram. This may be done every 1-2 years.  Talk to your health care provider about how often you should have regular mammograms. Talk with your health care provider about your test results, treatment options, and if necessary, the need for more tests. Vaccines  Your health care provider may recommend certain vaccines, such as: Influenza vaccine. This is  recommended every year. Tetanus, diphtheria, and acellular pertussis (Tdap, Td) vaccine. You may need a Td booster every 10 years. Zoster vaccine. You may need this after age 57. Pneumococcal 13-valent conjugate (PCV13) vaccine. One dose is recommended after age 45. Pneumococcal polysaccharide (PPSV23) vaccine. One dose is recommended after age 9. Talk to your health care provider about which screenings and vaccines you need and how often you need them. This information is not intended to replace advice given to you by your health care provider. Make sure you discuss any questions you have with your health care provider. Document Released: 02/11/2015 Document Revised: 10/05/2015 Document Reviewed: 11/16/2014 Elsevier Interactive Patient Education  2017 [employer].  Fall Prevention in the Home Falls can cause injuries. They can happen to people of all ages. There are many things you can do to make your home safe and to help prevent falls. What can I do on the outside of my home? Regularly fix the edges of walkways and driveways and fix any cracks. Remove anything that might make you trip as you walk through a door, such as a raised step or threshold. Trim any bushes or trees on the path to your home. Use bright outdoor lighting. Clear any walking paths of anything that might make someone trip, such as rocks or tools. Regularly check to see if handrails are loose or broken. Make sure that both sides of any steps have handrails. Any raised decks and porches should have guardrails on the edges. Have any leaves, snow, or ice cleared regularly. Use sand or salt on walking paths during winter. Clean up any spills in your garage right away. This includes oil or grease spills. What can I do in the bathroom? Use night lights. Install grab bars by the toilet and in the tub and shower. Do not use towel bars as grab bars. Use non-skid mats or decals in the tub or shower. If you need to sit down in  the shower, use a plastic, non-slip stool. Keep the floor dry. Clean up any water that spills on the floor as soon as it happens. Remove soap buildup in the tub or shower regularly. Attach bath mats securely with double-sided non-slip rug tape. Do not have throw rugs and other things on the floor that can make you trip. What can I do in the bedroom? Use night lights. Make sure that you have a light by your bed that is easy to reach. Do not use any sheets or blankets that are too big for your bed. They should not hang down onto the floor. Have a firm chair that has side arms. You can use this for support while you get dressed. Do not have throw rugs and other things on the floor that can make you trip. What can I do in the kitchen? Clean up any spills right away. Avoid walking on wet floors. Keep items that you use a lot in easy-to-reach places. If you need to reach something above you, use a strong step stool that has a grab bar. Keep electrical cords out of the way. Do not use floor polish or wax that makes floors slippery. If you must use wax, use non-skid floor wax.  Do not have throw rugs and other things on the floor that can make you trip. What can I do with my stairs? Do not leave any items on the stairs. Make sure that there are handrails on both sides of the stairs and use them. Fix handrails that are broken or loose. Make sure that handrails are as long as the stairways. Check any carpeting to make sure that it is firmly attached to the stairs. Fix any carpet that is loose or worn. Avoid having throw rugs at the top or bottom of the stairs. If you do have throw rugs, attach them to the floor with carpet tape. Make sure that you have a light switch at the top of the stairs and the bottom of the stairs. If you do not have them, ask someone to add them for you. What else can I do to help prevent falls? Wear shoes that: Do not have high heels. Have rubber bottoms. Are comfortable  and fit you well. Are closed at the toe. Do not wear sandals. If you use a stepladder: Make sure that it is fully opened. Do not climb a closed stepladder. Make sure that both sides of the stepladder are locked into place. Ask someone to hold it for you, if possible. Clearly mark and make sure that you can see: Any grab bars or handrails. First and last steps. Where the edge of each step is. Use tools that help you move around (mobility aids) if they are needed. These include: Canes. Walkers. Scooters. Crutches. Turn on the lights when you go into a dark area. Replace any light bulbs as soon as they burn out. Set up your furniture so you have a clear path. Avoid moving your furniture around. If any of your floors are uneven, fix them. If there are any pets around you, be aware of where they are. Review your medicines with your doctor. Some medicines can make you feel dizzy. This can increase your chance of falling. Ask your doctor what other things that you can do to help prevent falls. This information is not intended to replace advice given to you by your health care provider. Make sure you discuss any questions you have with your health care provider. Document Released: 11/11/2008 Document Revised: 06/23/2015 Document Reviewed: 02/19/2014 Elsevier Interactive Patient Education  2017 [employer].

## 2022-10-05 IMAGING — CT CT HEAD W/O CM
4 series · 16 of 47 positions shown, 18 images · non-contrast
Comparison: None.

CLINICAL DATA: Weakness failure to thrive

EXAM:
CT HEAD WITHOUT CONTRAST
TECHNIQUE: Contiguous axial images were obtained from the base of the skull
through the vertex without intravenous contrast.

[Series 2: head bone · axial · 0.42mm/px · z∈[-172,-142]mm · 3 of 77 slices shown]
[im 8/77  bone]
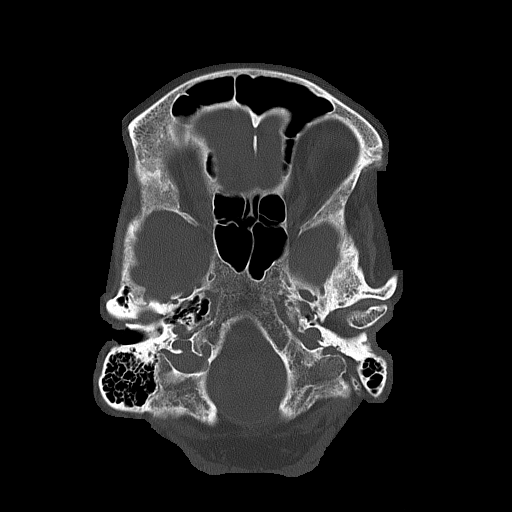
[im 16/77  bone]
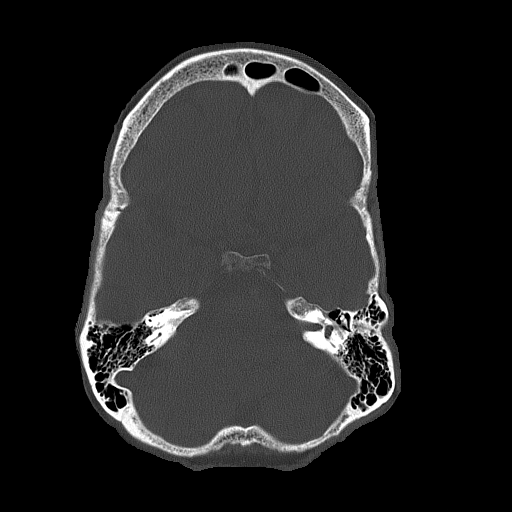
[im 23/77  bone]
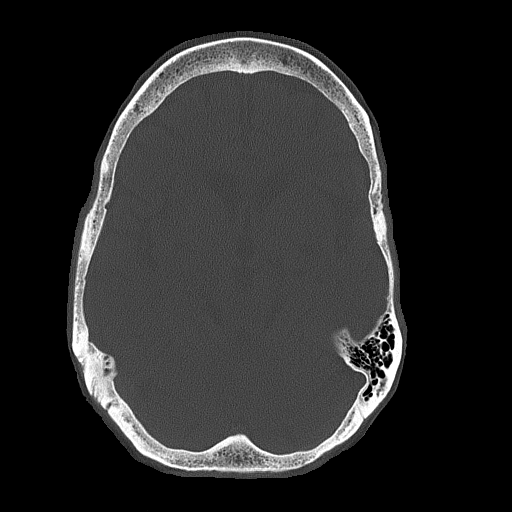

[Series 3: head wo · axial · 0.42mm/px · z∈[-171,-56]mm · 7 of 31 slices shown, 9 images]
[im 4/31  brain]
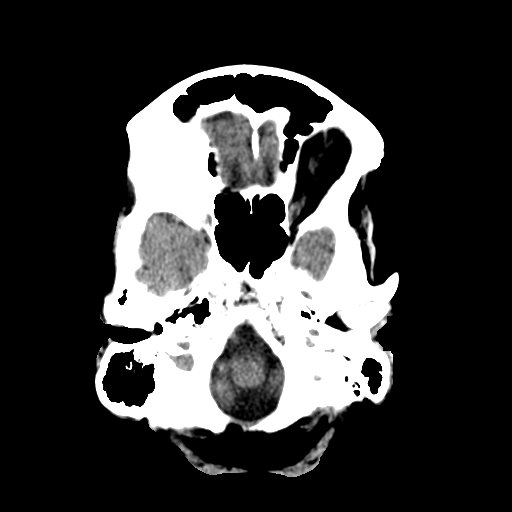
[im 4/31  bone]
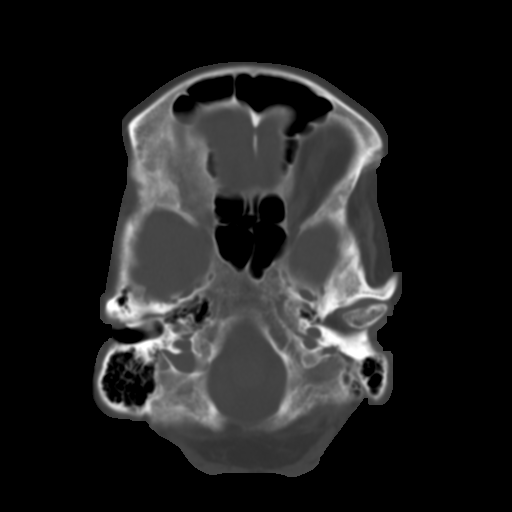
[im 8/31  brain]
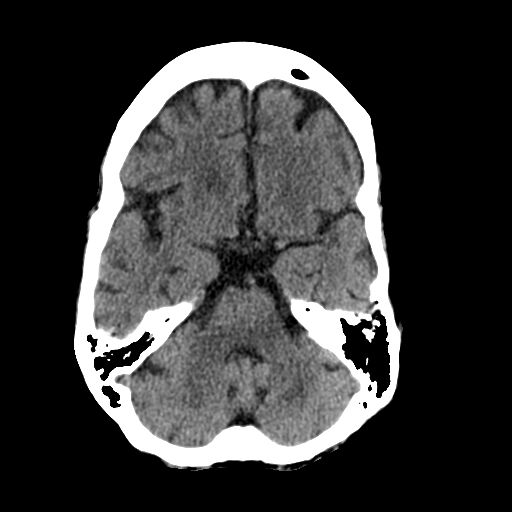
[im 12/31  brain]
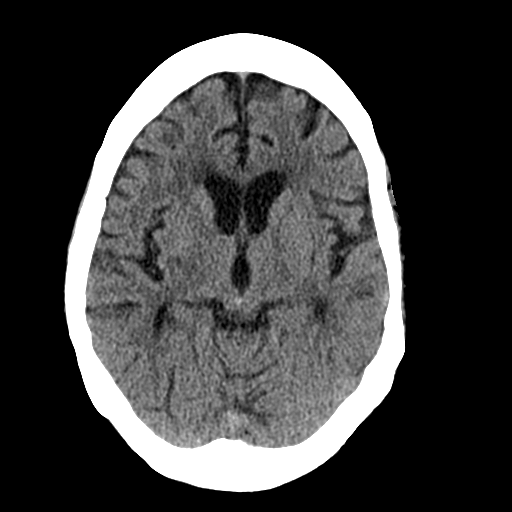
[im 16/31  brain]
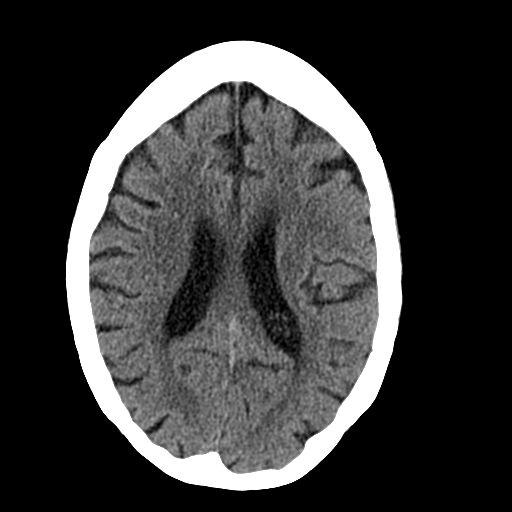
[im 19/31  brain]
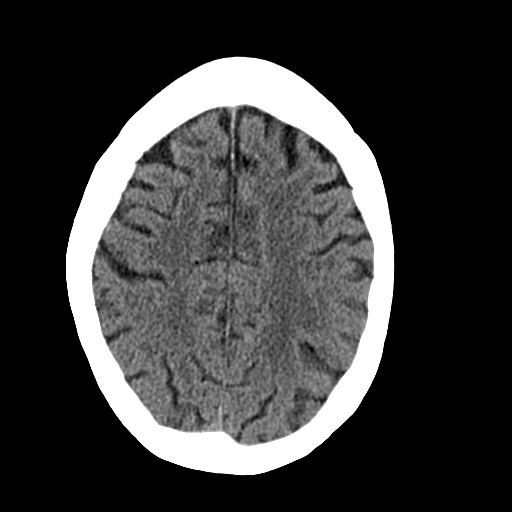
[im 19/31  bone]
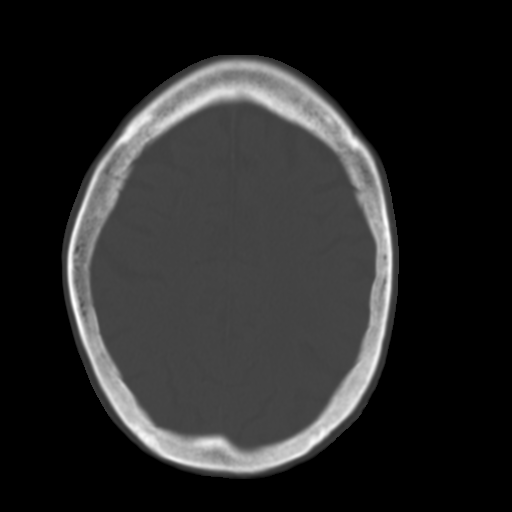
[im 23/31  brain]
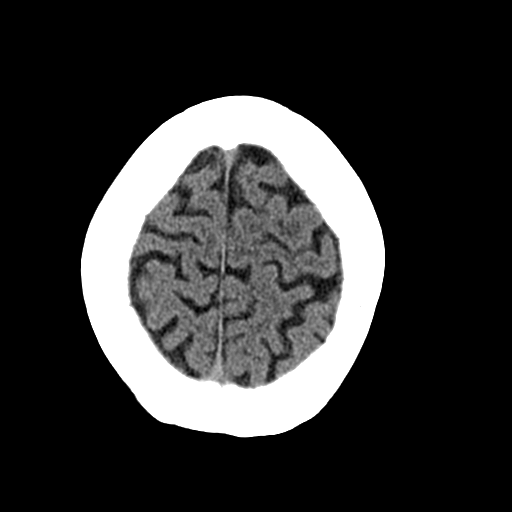
[im 27/31  brain]
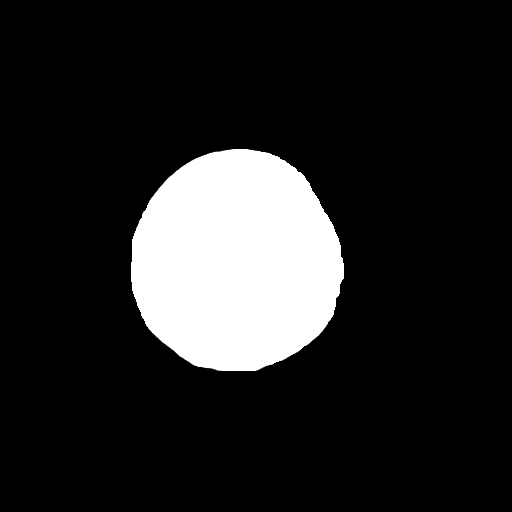

[Series 4: coronal soft tissue · coronal · 0.30mm/px · 3 of 72 slices shown]
[im 24/72  brain]
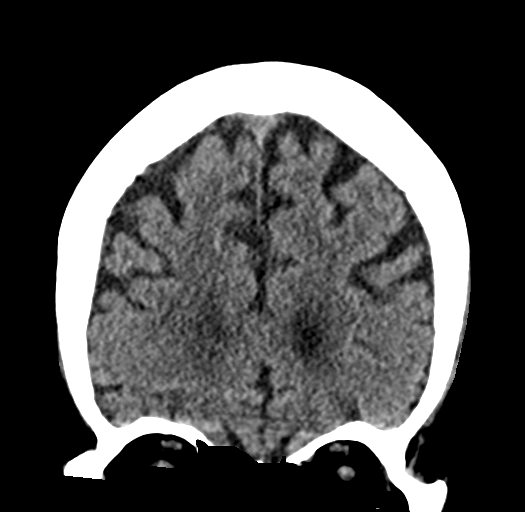
[im 32/72  brain]
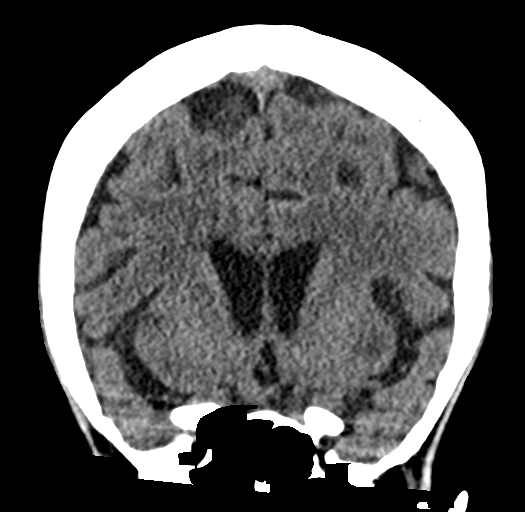
[im 40/72  brain]
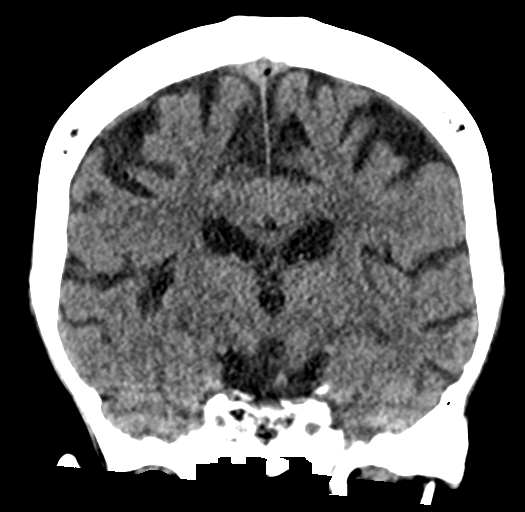

[Series 5: sagittal soft tissue · sagittal · 0.30mm/px · 3 of 52 slices shown]
[im 18/52  brain]
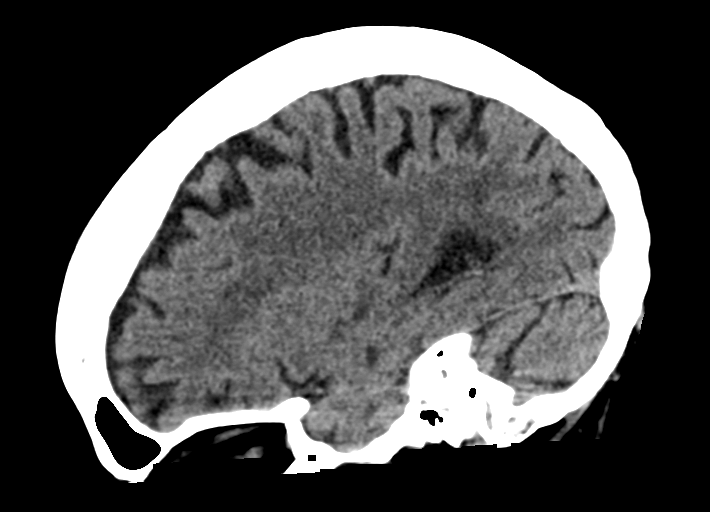
[im 26/52  brain]
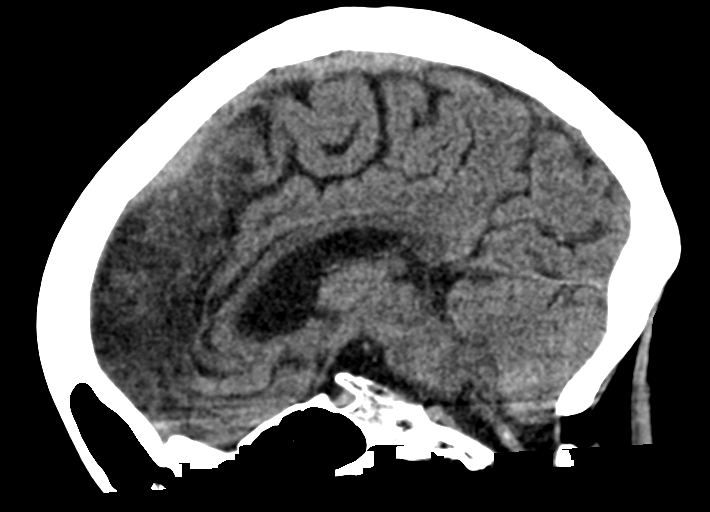
[im 35/52  brain]
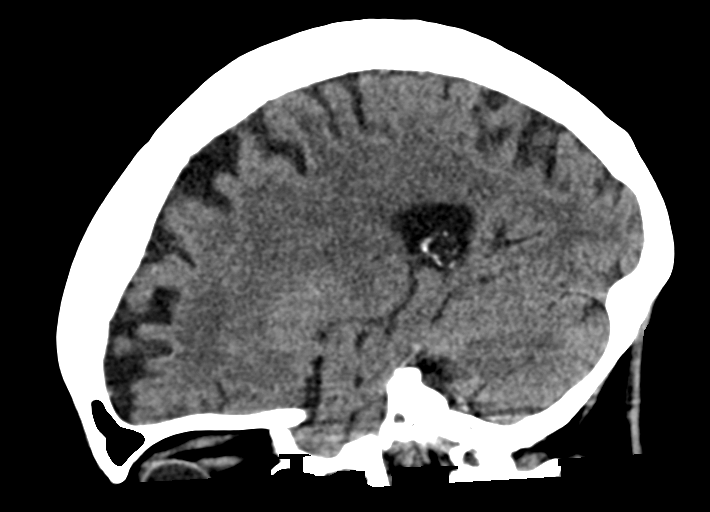

[16 of 47 positions shown; findings below may reference images not displayed]

FINDINGS: Brain: No acute territorial infarction, hemorrhage or intracranial
mass. Mild atrophy. Minimal chronic small vessel ischemic changes of
the white matter. Chronic appearing small infarcts within the
bilateral basal ganglia. Ventricles are nonenlarged

Vascular: No hyperdense vessels. Mild carotid vascular calcification

Skull: Normal. Negative for fracture or focal lesion.

Sinuses/Orbits: No acute finding.

Other: None
IMPRESSION: 1. No CT evidence for acute intracranial abnormality.
2. Mild atrophy and chronic small vessel ischemic changes of the
white matter. Small chronic appearing infarcts within the bilateral
basal ganglia

## 2022-12-20 ENCOUNTER — Encounter
Admission: EM | Disposition: A | Discharge status: Skilled Nursing Facility | Payer: Self-pay | Source: Home / Self Care | Attending: Internal Medicine

## 2022-12-20 ENCOUNTER — Inpatient Hospital Stay: Payer: Medicare HMO

## 2022-12-20 ENCOUNTER — Other Ambulatory Visit: Payer: Self-pay

## 2022-12-20 ENCOUNTER — Emergency Department: Payer: Medicare HMO

## 2022-12-20 ENCOUNTER — Inpatient Hospital Stay: Payer: Medicare HMO | Admitting: Anesthesiology

## 2022-12-20 ENCOUNTER — Encounter: Payer: Self-pay | Admitting: Internal Medicine

## 2022-12-20 ENCOUNTER — Inpatient Hospital Stay
Admission: EM | Admit: 2022-12-20 | Discharge: 2022-12-28 | DRG: 480 | Disposition: A | Discharge status: Skilled Nursing Facility | Payer: Medicare HMO | Attending: Internal Medicine | Admitting: Internal Medicine

## 2022-12-20 DIAGNOSIS — Z681 Body mass index (BMI) 19 or less, adult: Secondary | ICD-10-CM

## 2022-12-20 DIAGNOSIS — Y92000 Kitchen of unspecified non-institutional (private) residence as  the place of occurrence of the external cause: Secondary | ICD-10-CM

## 2022-12-20 DIAGNOSIS — K5900 Constipation, unspecified: Secondary | ICD-10-CM | POA: Diagnosis not present

## 2022-12-20 DIAGNOSIS — F419 Anxiety disorder, unspecified: Secondary | ICD-10-CM | POA: Diagnosis present

## 2022-12-20 DIAGNOSIS — F17210 Nicotine dependence, cigarettes, uncomplicated: Secondary | ICD-10-CM | POA: Diagnosis present

## 2022-12-20 DIAGNOSIS — F32A Depression, unspecified: Secondary | ICD-10-CM | POA: Diagnosis present

## 2022-12-20 DIAGNOSIS — E871 Hypo-osmolality and hyponatremia: Secondary | ICD-10-CM | POA: Diagnosis present

## 2022-12-20 DIAGNOSIS — S72141A Displaced intertrochanteric fracture of right femur, initial encounter for closed fracture: Principal | ICD-10-CM | POA: Diagnosis present

## 2022-12-20 DIAGNOSIS — Z87891 Personal history of nicotine dependence: Secondary | ICD-10-CM

## 2022-12-20 DIAGNOSIS — F10139 Alcohol abuse with withdrawal, unspecified: Secondary | ICD-10-CM | POA: Diagnosis present

## 2022-12-20 DIAGNOSIS — W1830XA Fall on same level, unspecified, initial encounter: Secondary | ICD-10-CM | POA: Diagnosis present

## 2022-12-20 DIAGNOSIS — S7221XA Displaced subtrochanteric fracture of right femur, initial encounter for closed fracture: Principal | ICD-10-CM

## 2022-12-20 DIAGNOSIS — E43 Unspecified severe protein-calorie malnutrition: Secondary | ICD-10-CM | POA: Diagnosis present

## 2022-12-20 DIAGNOSIS — D62 Acute posthemorrhagic anemia: Secondary | ICD-10-CM | POA: Diagnosis not present

## 2022-12-20 DIAGNOSIS — S72001A Fracture of unspecified part of neck of right femur, initial encounter for closed fracture: Secondary | ICD-10-CM

## 2022-12-20 DIAGNOSIS — S72009A Fracture of unspecified part of neck of unspecified femur, initial encounter for closed fracture: Secondary | ICD-10-CM | POA: Diagnosis present

## 2022-12-20 DIAGNOSIS — R296 Repeated falls: Secondary | ICD-10-CM | POA: Diagnosis present

## 2022-12-20 DIAGNOSIS — R030 Elevated blood-pressure reading, without diagnosis of hypertension: Secondary | ICD-10-CM | POA: Diagnosis present

## 2022-12-20 DIAGNOSIS — Z9181 History of falling: Secondary | ICD-10-CM | POA: Diagnosis not present

## 2022-12-20 DIAGNOSIS — W19XXXA Unspecified fall, initial encounter: Secondary | ICD-10-CM | POA: Diagnosis not present

## 2022-12-20 HISTORY — PX: ORIF HIP FRACTURE: SHX2125

## 2022-12-20 LAB — CBC WITH DIFFERENTIAL/PLATELET
Abs Immature Granulocytes: 0.03 10*3/uL (ref 0.00–0.07)
Basophils Absolute: 0.1 10*3/uL (ref 0.0–0.1)
Basophils Relative: 1 %
Eosinophils Absolute: 0 10*3/uL (ref 0.0–0.5)
Eosinophils Relative: 0 %
HCT: 35.8 % — ABNORMAL LOW (ref 36.0–46.0)
Hemoglobin: 12.5 g/dL (ref 12.0–15.0)
Immature Granulocytes: 0 %
Lymphocytes Relative: 15 %
Lymphs Abs: 1.2 10*3/uL (ref 0.7–4.0)
MCH: 32.8 pg (ref 26.0–34.0)
MCHC: 34.9 g/dL (ref 30.0–36.0)
MCV: 94 fL (ref 80.0–100.0)
Monocytes Absolute: 0.7 10*3/uL (ref 0.1–1.0)
Monocytes Relative: 9 %
Neutro Abs: 5.7 10*3/uL (ref 1.7–7.7)
Neutrophils Relative %: 75 %
Platelets: 164 10*3/uL (ref 150–400)
RBC: 3.81 MIL/uL — ABNORMAL LOW (ref 3.87–5.11)
RDW: 13.2 % (ref 11.5–15.5)
WBC: 7.7 10*3/uL (ref 4.0–10.5)
nRBC: 0 % (ref 0.0–0.2)

## 2022-12-20 LAB — COMPREHENSIVE METABOLIC PANEL
ALT: 39 U/L (ref 0–44)
AST: 98 U/L — ABNORMAL HIGH (ref 15–41)
Albumin: 3.5 g/dL (ref 3.5–5.0)
Alkaline Phosphatase: 107 U/L (ref 38–126)
Anion gap: 17 — ABNORMAL HIGH (ref 5–15)
BUN: 11 mg/dL (ref 8–23)
CO2: 21 mmol/L — ABNORMAL LOW (ref 22–32)
Calcium: 8.6 mg/dL — ABNORMAL LOW (ref 8.9–10.3)
Chloride: 94 mmol/L — ABNORMAL LOW (ref 98–111)
Creatinine, Ser: 0.76 mg/dL (ref 0.44–1.00)
GFR, Estimated: >60 mL/min (ref >60)
Glucose, Bld: 115 mg/dL — ABNORMAL HIGH (ref 70–99)
Potassium: 3.6 mmol/L (ref 3.5–5.1)
Sodium: 132 mmol/L — ABNORMAL LOW (ref 135–145)
Total Bilirubin: 0.4 mg/dL (ref <1.2)
Total Protein: 6.4 g/dL — ABNORMAL LOW (ref 6.5–8.1)

## 2022-12-20 LAB — PROTIME-INR
INR: 1.1 (ref 0.8–1.2)
Prothrombin Time: 14 s (ref 11.4–15.2)

## 2022-12-20 LAB — ABO/RH: ABO/RH(D): A POS

## 2022-12-20 SURGERY — OPEN REDUCTION INTERNAL FIXATION HIP
Anesthesia: General | Site: Hip | Laterality: Right

## 2022-12-20 MED ORDER — LORAZEPAM 1 MG PO TABS
1.0000 mg | ORAL_TABLET | ORAL | Status: AC | PRN
  Administered 2022-12-22: 2 mg via ORAL
  Filled 2022-12-20: qty 2

## 2022-12-20 MED ORDER — ENOXAPARIN SODIUM 40 MG/0.4ML IJ SOSY
40.0000 mg | PREFILLED_SYRINGE | INTRAMUSCULAR | Status: DC
  Filled 2022-12-20: qty 0.4

## 2022-12-20 MED ORDER — CEFAZOLIN SODIUM-DEXTROSE 1-4 GM/50ML-% IV SOLN
INTRAVENOUS | Status: DC | PRN
  Administered 2022-12-20: 1 g via INTRAVENOUS

## 2022-12-20 MED ORDER — ONDANSETRON HCL 4 MG/2ML IJ SOLN
4.0000 mg | Freq: Once | INTRAMUSCULAR | Status: AC
  Administered 2022-12-20: 4 mg via INTRAVENOUS
  Filled 2022-12-20: qty 2

## 2022-12-20 MED ORDER — BUPIVACAINE-EPINEPHRINE (PF) 0.25% -1:200000 IJ SOLN
INTRAMUSCULAR | Status: DC | PRN
  Administered 2022-12-20: 30 mL

## 2022-12-20 MED ORDER — ONDANSETRON HCL 4 MG/2ML IJ SOLN
4.0000 mg | Freq: Four times a day (QID) | INTRAMUSCULAR | Status: DC | PRN
Start: 2022-12-20 — End: 2022-12-28
  Administered 2022-12-20 – 2022-12-27 (×2): 4 mg via INTRAVENOUS
  Filled 2022-12-20 (×2): qty 2

## 2022-12-20 MED ORDER — HYDROMORPHONE HCL 1 MG/ML IJ SOLN
0.5000 mg | INTRAMUSCULAR | Status: DC | PRN
  Administered 2022-12-20 – 2022-12-21 (×2): 0.5 mg via INTRAVENOUS
  Filled 2022-12-20 (×2): qty 0.5

## 2022-12-20 MED ORDER — ADULT MULTIVITAMIN W/MINERALS CH
1.0000 | ORAL_TABLET | Freq: Every day | ORAL | Status: DC
  Administered 2022-12-21 – 2022-12-28 (×8): 1 via ORAL
  Filled 2022-12-20 (×9): qty 1

## 2022-12-20 MED ORDER — CEFAZOLIN SODIUM-DEXTROSE 2-4 GM/100ML-% IV SOLN
2.0000 g | Freq: Once | INTRAVENOUS | Status: AC
  Administered 2022-12-20: 2 g via INTRAVENOUS
  Filled 2022-12-20: qty 100

## 2022-12-20 MED ORDER — THIAMINE MONONITRATE 100 MG PO TABS
100.0000 mg | ORAL_TABLET | Freq: Every day | ORAL | Status: DC
  Administered 2022-12-21 – 2022-12-28 (×8): 100 mg via ORAL
  Filled 2022-12-20 (×10): qty 1

## 2022-12-20 MED ORDER — FENTANYL CITRATE (PF) 100 MCG/2ML IJ SOLN
25.0000 ug | INTRAMUSCULAR | Status: DC | PRN
  Administered 2022-12-20 (×2): 25 ug via INTRAVENOUS

## 2022-12-20 MED ORDER — PHENYLEPHRINE HCL-NACL 20-0.9 MG/250ML-% IV SOLN
INTRAVENOUS | Status: DC | PRN
  Administered 2022-12-20: 50 ug/min via INTRAVENOUS

## 2022-12-20 MED ORDER — THIAMINE HCL 100 MG/ML IJ SOLN
100.0000 mg | Freq: Every day | INTRAMUSCULAR | Status: DC
  Filled 2022-12-20 (×2): qty 2

## 2022-12-20 MED ORDER — DEXAMETHASONE SODIUM PHOSPHATE 10 MG/ML IJ SOLN
INTRAMUSCULAR | Status: AC
  Filled 2022-12-20: qty 1

## 2022-12-20 MED ORDER — CEFAZOLIN (ANCEF) 1 G IV SOLR: 2.0000 g | INTRAVENOUS | Status: DC

## 2022-12-20 MED ORDER — FENTANYL CITRATE (PF) 100 MCG/2ML IJ SOLN
INTRAMUSCULAR | Status: AC
  Filled 2022-12-20: qty 2

## 2022-12-20 MED ORDER — ONDANSETRON HCL 4 MG/2ML IJ SOLN: 4.0000 mg | Freq: Four times a day (QID) | INTRAMUSCULAR | Status: DC | PRN

## 2022-12-20 MED ORDER — SUCCINYLCHOLINE CHLORIDE 200 MG/10ML IV SOSY
PREFILLED_SYRINGE | INTRAVENOUS | Status: DC | PRN
  Administered 2022-12-20: 100 mg via INTRAVENOUS

## 2022-12-20 MED ORDER — SENNOSIDES-DOCUSATE SODIUM 8.6-50 MG PO TABS: 1.0000 | ORAL_TABLET | Freq: Every evening | ORAL | Status: DC | PRN

## 2022-12-20 MED ORDER — ENOXAPARIN SODIUM 40 MG/0.4ML IJ SOSY
40.0000 mg | PREFILLED_SYRINGE | INTRAMUSCULAR | Status: DC
  Administered 2022-12-21: 40 mg via SUBCUTANEOUS
  Filled 2022-12-20: qty 0.4

## 2022-12-20 MED ORDER — LIDOCAINE HCL (CARDIAC) PF 100 MG/5ML IV SOSY
PREFILLED_SYRINGE | INTRAVENOUS | Status: DC | PRN
  Administered 2022-12-20: 50 mg via INTRAVENOUS

## 2022-12-20 MED ORDER — ACETAMINOPHEN 10 MG/ML IV SOLN: 1000.0000 mg | Freq: Once | INTRAVENOUS | Status: DC | PRN

## 2022-12-20 MED ORDER — OXYCODONE HCL 5 MG/5ML PO SOLN: 5.0000 mg | Freq: Once | ORAL | Status: DC | PRN

## 2022-12-20 MED ORDER — CEFAZOLIN SODIUM 1 G IJ SOLR
INTRAMUSCULAR | Status: AC
  Filled 2022-12-20: qty 10

## 2022-12-20 MED ORDER — ACETAMINOPHEN 10 MG/ML IV SOLN
INTRAVENOUS | Status: DC | PRN
  Administered 2022-12-20: 1000 mg via INTRAVENOUS

## 2022-12-20 MED ORDER — ACETAMINOPHEN 500 MG PO TABS
500.0000 mg | ORAL_TABLET | Freq: Four times a day (QID) | ORAL | Status: AC
Start: 2022-12-20 — End: 2022-12-21
  Administered 2022-12-21 (×2): 500 mg via ORAL
  Filled 2022-12-20 (×4): qty 1

## 2022-12-20 MED ORDER — LACTATED RINGERS IV BOLUS
1000.0000 mL | Freq: Once | INTRAVENOUS | Status: AC
  Administered 2022-12-20: 1000 mL via INTRAVENOUS

## 2022-12-20 MED ORDER — SUGAMMADEX SODIUM 200 MG/2ML IV SOLN
INTRAVENOUS | Status: DC | PRN
  Administered 2022-12-20: 100 mg via INTRAVENOUS

## 2022-12-20 MED ORDER — LORAZEPAM 2 MG/ML IJ SOLN
1.0000 mg | INTRAMUSCULAR | Status: AC | PRN
  Administered 2022-12-22 (×3): 4 mg via INTRAVENOUS
  Filled 2022-12-20 (×3): qty 2

## 2022-12-20 MED ORDER — PROPOFOL 10 MG/ML IV BOLUS
INTRAVENOUS | Status: AC
  Filled 2022-12-20: qty 20

## 2022-12-20 MED ORDER — ONDANSETRON HCL 4 MG/2ML IJ SOLN
INTRAMUSCULAR | Status: AC
  Filled 2022-12-20: qty 2

## 2022-12-20 MED ORDER — MORPHINE SULFATE (PF) 4 MG/ML IV SOLN
4.0000 mg | Freq: Once | INTRAVENOUS | Status: AC
  Administered 2022-12-20: 4 mg via INTRAVENOUS
  Filled 2022-12-20: qty 1

## 2022-12-20 MED ORDER — ONDANSETRON HCL 4 MG/2ML IJ SOLN
INTRAMUSCULAR | Status: DC | PRN
  Administered 2022-12-20: 4 mg via INTRAVENOUS

## 2022-12-20 MED ORDER — ACETAMINOPHEN 325 MG PO TABS
325.0000 mg | ORAL_TABLET | Freq: Four times a day (QID) | ORAL | Status: DC | PRN
  Administered 2022-12-22: 650 mg via ORAL
  Administered 2022-12-24: 325 mg via ORAL
  Administered 2022-12-25 – 2022-12-27 (×3): 650 mg via ORAL
  Filled 2022-12-20 (×5): qty 2

## 2022-12-20 MED ORDER — DOCUSATE SODIUM 100 MG PO CAPS
100.0000 mg | ORAL_CAPSULE | Freq: Two times a day (BID) | ORAL | Status: DC
  Administered 2022-12-20 – 2022-12-28 (×16): 100 mg via ORAL
  Filled 2022-12-20 (×16): qty 1

## 2022-12-20 MED ORDER — ROCURONIUM BROMIDE 100 MG/10ML IV SOLN
INTRAVENOUS | Status: DC | PRN
  Administered 2022-12-20: 30 mg via INTRAVENOUS

## 2022-12-20 MED ORDER — METOPROLOL TARTRATE 25 MG PO TABS
12.5000 mg | ORAL_TABLET | Freq: Two times a day (BID) | ORAL | Status: DC
  Administered 2022-12-20 – 2022-12-28 (×15): 12.5 mg via ORAL
  Filled 2022-12-20 (×16): qty 1

## 2022-12-20 MED ORDER — NICOTINE 7 MG/24HR TD PT24
7.0000 mg | MEDICATED_PATCH | Freq: Every day | TRANSDERMAL | Status: DC
  Administered 2022-12-21 – 2022-12-22 (×2): 7 mg via TRANSDERMAL
  Filled 2022-12-20 (×3): qty 1

## 2022-12-20 MED ORDER — PHENYLEPHRINE HCL-NACL 20-0.9 MG/250ML-% IV SOLN
INTRAVENOUS | Status: AC
  Filled 2022-12-20: qty 250

## 2022-12-20 MED ORDER — MIDAZOLAM HCL 2 MG/2ML IJ SOLN
INTRAMUSCULAR | Status: AC
  Filled 2022-12-20: qty 2

## 2022-12-20 MED ORDER — LIDOCAINE HCL (PF) 2 % IJ SOLN
INTRAMUSCULAR | Status: AC
  Filled 2022-12-20: qty 5

## 2022-12-20 MED ORDER — HYDRALAZINE HCL 20 MG/ML IJ SOLN: 5.0000 mg | Freq: Four times a day (QID) | INTRAMUSCULAR | Status: DC | PRN

## 2022-12-20 MED ORDER — DROPERIDOL 2.5 MG/ML IJ SOLN: 0.6250 mg | Freq: Once | INTRAMUSCULAR | Status: DC | PRN

## 2022-12-20 MED ORDER — PROPOFOL 10 MG/ML IV BOLUS
INTRAVENOUS | Status: DC | PRN
  Administered 2022-12-20: 100 mg via INTRAVENOUS

## 2022-12-20 MED ORDER — FOLIC ACID 1 MG PO TABS
1.0000 mg | ORAL_TABLET | Freq: Every day | ORAL | Status: DC
  Administered 2022-12-21 – 2022-12-28 (×8): 1 mg via ORAL
  Filled 2022-12-20 (×9): qty 1

## 2022-12-20 MED ORDER — METOCLOPRAMIDE HCL 5 MG PO TABS: 5.0000 mg | ORAL_TABLET | Freq: Three times a day (TID) | ORAL | Status: DC | PRN

## 2022-12-20 MED ORDER — CEFAZOLIN SODIUM-DEXTROSE 2-4 GM/100ML-% IV SOLN
2.0000 g | Freq: Four times a day (QID) | INTRAVENOUS | Status: AC
  Administered 2022-12-20 – 2022-12-21 (×3): 2 g via INTRAVENOUS
  Filled 2022-12-20 (×3): qty 100

## 2022-12-20 MED ORDER — HYDROCODONE-ACETAMINOPHEN 5-325 MG PO TABS
1.0000 | ORAL_TABLET | Freq: Four times a day (QID) | ORAL | Status: DC | PRN
  Administered 2022-12-20 – 2022-12-21 (×2): 2 via ORAL
  Administered 2022-12-23 (×2): 1 via ORAL
  Administered 2022-12-23 (×2): 2 via ORAL
  Administered 2022-12-25 – 2022-12-28 (×5): 1 via ORAL
  Filled 2022-12-20: qty 2
  Filled 2022-12-20 (×3): qty 1
  Filled 2022-12-20: qty 2
  Filled 2022-12-20 (×2): qty 1
  Filled 2022-12-20 (×2): qty 2
  Filled 2022-12-20: qty 1
  Filled 2022-12-20: qty 2
  Filled 2022-12-20 (×2): qty 1
  Filled 2022-12-20: qty 2

## 2022-12-20 MED ORDER — METOCLOPRAMIDE HCL 5 MG/ML IJ SOLN
5.0000 mg | Freq: Three times a day (TID) | INTRAMUSCULAR | Status: DC | PRN
  Administered 2022-12-20: 5 mg via INTRAVENOUS
  Filled 2022-12-20: qty 2

## 2022-12-20 MED ORDER — PHENYLEPHRINE 80 MCG/ML (10ML) SYRINGE FOR IV PUSH (FOR BLOOD PRESSURE SUPPORT)
PREFILLED_SYRINGE | INTRAVENOUS | Status: DC | PRN
  Administered 2022-12-20 (×5): 160 ug via INTRAVENOUS

## 2022-12-20 MED ORDER — ASPIRIN 81 MG PO TBEC
81.0000 mg | DELAYED_RELEASE_TABLET | Freq: Every day | ORAL | Status: DC
  Filled 2022-12-20: qty 1

## 2022-12-20 MED ORDER — BUPIVACAINE HCL (PF) 0.5 % IJ SOLN
INTRAMUSCULAR | Status: AC
  Filled 2022-12-20: qty 30

## 2022-12-20 MED ORDER — ONDANSETRON HCL 4 MG PO TABS
4.0000 mg | ORAL_TABLET | Freq: Four times a day (QID) | ORAL | Status: DC | PRN
  Administered 2022-12-21: 4 mg via ORAL
  Filled 2022-12-20: qty 1

## 2022-12-20 MED ORDER — LACTATED RINGERS IV SOLN: INTRAVENOUS | Status: DC | PRN

## 2022-12-20 MED ORDER — OXYCODONE HCL 5 MG PO TABS: 5.0000 mg | ORAL_TABLET | Freq: Once | ORAL | Status: DC | PRN

## 2022-12-20 MED ORDER — PHENYLEPHRINE 80 MCG/ML (10ML) SYRINGE FOR IV PUSH (FOR BLOOD PRESSURE SUPPORT)
PREFILLED_SYRINGE | INTRAVENOUS | Status: AC
  Filled 2022-12-20: qty 10

## 2022-12-20 MED ORDER — DEXAMETHASONE SODIUM PHOSPHATE 10 MG/ML IJ SOLN
INTRAMUSCULAR | Status: DC | PRN
  Administered 2022-12-20: 5 mg via INTRAVENOUS

## 2022-12-20 MED ORDER — FENTANYL CITRATE (PF) 100 MCG/2ML IJ SOLN
INTRAMUSCULAR | Status: DC | PRN
  Administered 2022-12-20 (×4): 50 ug via INTRAVENOUS

## 2022-12-20 SURGICAL SUPPLY — 45 items
BIT DRILL CANN 16 HIP (BIT) IMPLANT
BIT DRILL CANN STP 6/9 HIP (BIT) IMPLANT
BIT DRILL LONG 4.2 (BIT) IMPLANT
BIT DRILL TAPERED 10 (BIT) IMPLANT
BLADE TFNA HELICAL 100 NS (Anchor) IMPLANT
BLADE TFNA HELICAL 95 NON STRL (Anchor) IMPLANT
BNDG COHESIVE 4X5 TAN STRL LF (GAUZE/BANDAGES/DRESSINGS) ×1 IMPLANT
CHLORAPREP W/TINT 26 (MISCELLANEOUS) ×2 IMPLANT
DRAPE C-ARMOR (DRAPES) IMPLANT
DRAPE INCISE 23X17 STRL (DRAPES) ×1 IMPLANT
DRAPE INCISE IOBAN 23X17 STRL (DRAPES) ×1 IMPLANT
DRSG OPSITE POSTOP 4X6 (GAUZE/BANDAGES/DRESSINGS) IMPLANT
ELECT REM PT RETURN 9FT ADLT (ELECTROSURGICAL) ×1
ELECTRODE REM PT RTRN 9FT ADLT (ELECTROSURGICAL) ×1 IMPLANT
GAUZE SPONGE 4X4 12PLY STRL (GAUZE/BANDAGES/DRESSINGS) ×2 IMPLANT
GLOVE INDICATOR 8.0 STRL GRN (GLOVE) ×1 IMPLANT
GLOVE SURG ORTHO 8.5 STRL (GLOVE) ×1 IMPLANT
GOWN STRL REUS W/ TWL LRG LVL3 (GOWN DISPOSABLE) ×1 IMPLANT
GOWN STRL REUS W/TWL LRG LVL4 (GOWN DISPOSABLE) ×1 IMPLANT
GUIDEWIRE 3.2X400 (WIRE) IMPLANT
KIT TURNOVER KIT A (KITS) ×1 IMPLANT
MANIFOLD NEPTUNE II (INSTRUMENTS) ×1 IMPLANT
MAT ABSORB FLUID 56X50 GRAY (MISCELLANEOUS) ×1 IMPLANT
NAIL IM TFNA 9X235 130D RT (Nail) IMPLANT
NDL SAFETY ECLIPSE 18X1.5 (NEEDLE) IMPLANT
NDL SPNL 18GX3.5 QUINCKE PK (NEEDLE) ×1 IMPLANT
NEEDLE SPNL 18GX3.5 QUINCKE PK (NEEDLE) ×1 IMPLANT
NS IRRIG 500ML POUR BTL (IV SOLUTION) ×1 IMPLANT
PACK HIP COMPR (MISCELLANEOUS) ×1 IMPLANT
PAD ABD DERMACEA PRESS 5X9 (GAUZE/BANDAGES/DRESSINGS) ×2 IMPLANT
SCREW LOCK STAR 5X32 (Screw) IMPLANT
SOL PREP PVP 2OZ (MISCELLANEOUS) ×1
SOLUTION PREP PVP 2OZ (MISCELLANEOUS) ×1 IMPLANT
STAPLER SKIN PROX 35W (STAPLE) ×1 IMPLANT
SUCTION TUBE FRAZIER 10FR DISP (SUCTIONS) ×1 IMPLANT
SUT VIC AB 0 CT1 36 (SUTURE) ×1 IMPLANT
SUT VIC AB 1 CT1 18XCR BRD 8 (SUTURE) IMPLANT
SUT VIC AB 1 CT1 36 (SUTURE) IMPLANT
SUT VIC AB 2-0 CT1 18 (SUTURE) IMPLANT
SUT VIC AB 2-0 CT1 TAPERPNT 27 (SUTURE) ×1 IMPLANT
SYR 10ML LL (SYRINGE) IMPLANT
SYR 20ML LL LF (SYRINGE) IMPLANT
SYR 30ML LL (SYRINGE) ×1 IMPLANT
TRAP FLUID SMOKE EVACUATOR (MISCELLANEOUS) ×1 IMPLANT
WATER STERILE IRR 500ML POUR (IV SOLUTION) ×1 IMPLANT

## 2022-12-20 NOTE — Anesthesia Preprocedure Evaluation (Addendum)
Anesthesia Evaluation  Patient identified by MRN, date of birth, ID band Patient awake    Reviewed: Allergy & Precautions, H&P , NPO status , Patient's Chart, lab work & pertinent test results, reviewed documented beta blocker date and time   History of Anesthesia Complications Negative for: history of anesthetic complications  Airway Mallampati: II  TM Distance: <3 FB Neck ROM: full    Dental  (+) Missing   Pulmonary neg pulmonary ROS, neg shortness of breath, Current Smoker   Pulmonary exam normal        Cardiovascular Exercise Tolerance: Good (-) angina (-) Past MI negative cardio ROS Normal cardiovascular exam     Neuro/Psych  PSYCHIATRIC DISORDERS Anxiety Depression    CVA  negative psych ROS   GI/Hepatic negative GI ROS, Neg liver ROS,neg GERD  ,,  Endo/Other  negative endocrine ROS    Renal/GU negative Renal ROS  negative genitourinary   Musculoskeletal   Abdominal   Peds  Hematology negative hematology ROS (+)   Anesthesia Other Findings Patient nauseated   Past Medical History: No date: Cataract No date: Depression Past Surgical History: No date: ABDOMINAL HYSTERECTOMY No date: CHOLECYSTECTOMY BMI    Body Mass Index: 19.49 kg/m     Reproductive/Obstetrics negative OB ROS                             Anesthesia Physical Anesthesia Plan  ASA: 3 and emergent  Anesthesia Plan: General ETT   Post-op Pain Management:    Induction: Intravenous  PONV Risk Score and Plan: 3 and Ondansetron, Dexamethasone, Midazolam and Treatment may vary due to age or medical condition  Airway Management Planned: Oral ETT  Additional Equipment:   Intra-op Plan:   Post-operative Plan: Extubation in OR  Informed Consent: I have reviewed the patients History and Physical, chart, labs and discussed the procedure including the risks, benefits and alternatives for the proposed anesthesia  with the patient or authorized representative who has indicated his/her understanding and acceptance.     Dental Advisory Given  Plan Discussed with: CRNA  Anesthesia Plan Comments: (Patient consented for risks of anesthesia including but not limited to:  - adverse reactions to medications - damage to eyes, teeth, lips or other oral mucosa - nerve damage due to positioning  - sore throat or hoarseness - Damage to heart, brain, nerves, lungs, other parts of body or loss of life  Patient voiced understanding and assent.)       Anesthesia Quick Evaluation

## 2022-12-20 NOTE — ED Provider Notes (Signed)
[facility] Provider Note    Event Date/Time   First MD Initiated Contact with Patient 12/20/22 [phone]     (approximate)   History   Chief Complaint: Fall   HPI  Jill Hayes is a 72 y.o. female with no significant past medical history who comes to the ED complaining of right hip and thigh pain after a fall.  Patient was in her usual state of health when she woke up this morning, walked to the kitchen and then fell to the floor.  She was unable to bear weight and get herself up.  After about 2 or 3 hours she was able to reach her phone and call for help.  EMS gave 50 mcg of fentanyl.  Patient denies head injury or loss of consciousness, no neck pain, no other complaints.  Denies any preceding symptoms such as chest pain shortness of breath or dizziness, denies passing out  Patient denies any regular medicines, takes no blood thinners.  Last oral intake was yesterday evening.  She does smoke 1 pack/day, and drinks 2 cans of beer a day on average.        Physical Exam   Triage Vital Signs: ED Triage Vitals  Encounter Vitals Group     BP      Systolic BP Percentile      Diastolic BP Percentile      Pulse      Resp      Temp      Temp src      SpO2      Weight      Height      Head Circumference      Peak Flow      Pain Score      Pain Loc      Pain Education      Exclude from Growth Chart     Most recent vital signs: Vitals:   12/20/22 0755  BP: (!) 122/109  Pulse: 80  Resp: 18  Temp: (!) 97.4 F (36.3 C)  SpO2: 100%    General: Awake, no distress.  CV:  Good peripheral perfusion.  Regular rate and rhythm Resp:  Normal effort.  Clear to auscultation bilaterally Abd:  No distention.  Soft nontender Other:  Tenderness at the right hip and right midshaft femur.  No open wounds.  There is shortening and external rotation of the right lower extremity.  Left leg has normal range of motion, upper extremity is unaffected.  No signs of head  trauma.  No C-spine tenderness.   ED Results / Procedures / Treatments   Labs (all labs ordered are listed, but only abnormal results are displayed) Labs Reviewed  CBC WITH DIFFERENTIAL/PLATELET - Abnormal; Notable for the following components:      Result Value   RBC 3.81 (*)    HCT 35.8 (*)    All other components within normal limits  COMPREHENSIVE METABOLIC PANEL - Abnormal; Notable for the following components:   Sodium 132 (*)    Chloride 94 (*)    CO2 21 (*)    Glucose, Bld 115 (*)    Calcium 8.6 (*)    Total Protein 6.4 (*)    AST 98 (*)    Anion gap 17 (*)    All other components within normal limits  PROTIME-INR     EKG Interpreted by me Sinus rhythm rate of 75.  Normal axis, normal intervals, normal QRS ST segments and T waves.  RADIOLOGY X-ray right femur interpreted by me, shows displaced fracture of the femur just distal to the greater trochanter.  No other visible injury. Radiology report reviewed   PROCEDURES:  Procedures   MEDICATIONS ORDERED IN ED: Medications  morphine (PF) 4 MG/ML injection 4 mg (4 mg Intravenous Given 12/20/22 0757)  ondansetron (ZOFRAN) injection 4 mg (4 mg Intravenous Given 12/20/22 0757)  lactated ringers bolus 1,000 mL (1,000 mLs Intravenous New Bag/Given 12/20/22 0757)     IMPRESSION / MDM / ASSESSMENT AND PLAN / ED COURSE  I reviewed the triage vital signs and the nursing notes.  DDx: Hip fracture, femur shaft fracture, dehydration, AKI, liver disease  Patient's presentation is most consistent with acute presentation with potential threat to life or bodily function.  Patient presents with clinically apparent hip fracture after a fall.  Will give morphine 4 mg IV for pain control.  Will obtain x-rays and labs, plan to admit with orthopedics consultation.   Clinical Course as of 12/20/22 0924  Thu Dec 20, 2022  0803 Pt now reports neck pain as well. Will obtain CTH + c/s for trauma eval [PS]  0909 Case d/w Ortho  Dr. Wilkie Aye, who will come eval. Maintain NPO [PS]    Clinical Course User Index [PS] Sharman Cheek, MD    ----------------------------------------- 9:30 AM on 12/20/2022 ----------------------------------------- Case discussed with hospitalist   FINAL CLINICAL IMPRESSION(S) / ED DIAGNOSES   Final diagnoses:  Subtrochanteric fracture of right femur, closed, initial encounter [facility]     Rx / DC Orders   ED Discharge Orders     None        Note:  This document was prepared using Dragon voice recognition software and may include unintentional dictation errors.   Sharman Cheek, MD 12/20/22 [phone]

## 2022-12-20 NOTE — Anesthesia Postprocedure Evaluation (Signed)
Anesthesia Post Note  Patient: Quincy Simmonds  Procedure(s) Performed: OPEN REDUCTION INTERNAL FIXATION HIP (Right: Hip)  Patient location during evaluation: PACU Anesthesia Type: General Level of consciousness: awake and alert Pain management: pain level controlled Vital Signs Assessment: post-procedure vital signs reviewed and stable Respiratory status: spontaneous breathing, nonlabored ventilation, respiratory function stable and patient connected to nasal cannula oxygen Cardiovascular status: blood pressure returned to baseline and stable Postop Assessment: no apparent nausea or vomiting Anesthetic complications: no   No notable events documented.   Last Vitals:  Vitals:   12/20/22 1600 12/20/22 1615  BP: (!) 147/63 (!) 150/68  Pulse: 94 92  Resp: 16 15  Temp:  (!) 36.3 C  SpO2: 97% 98%    Last Pain:  Vitals:   12/20/22 1620  TempSrc:   PainSc: 8                  Corinda Gubler

## 2022-12-20 NOTE — ED Triage Notes (Signed)
Pt to ED via EMS from home with mechanical fall. Pt presenting with right femoral swelling and external rotation with severe pain. Pt is A&O x4, unlabored breathing.   CBG 164 50 mcg Fentanyl 4 mg Zofran 18 L AC  HR 80 99% RR 20 CO2 20 127/87

## 2022-12-20 NOTE — Brief Op Note (Signed)
12/20/2022  4:04 PM  PATIENT:  Quincy Simmonds  72 y.o. female  PRE-OPERATIVE DIAGNOSIS:  right intertrochanteric hip fracture  POST-OPERATIVE DIAGNOSIS:  right intertrochanteric hip fracture  PROCEDURE:  Procedure(s) with comments: OPEN REDUCTION INTERNAL FIXATION HIP (Right) - intermediate TFN ( Synthes) with C- arm  and asssistant  SURGEON:  Surgeons and Role:    * Darreon Lutes, Windy Canny, MD - Primary  PHYSICIAN ASSISTANT:    ANESTHESIA:   general  EBL:  100 mL   BLOOD ADMINISTERED:none  DRAINS: none   LOCAL MEDICATIONS USED:  LIDOCAINE  and Amount: 30 ml  SPECIMEN:  No Specimen  DISPOSITION OF SPECIMEN:  N/A  COUNTS:  YES  TOURNIQUET:  * No tourniquets in log *  DICTATION: .Dragon Dictation  PLAN OF CARE: Admit to inpatient   PATIENT DISPOSITION:  PACU - hemodynamically stable.   Delay start of Pharmacological VTE agent (>24hrs) due to surgical blood loss or risk of bleeding: no

## 2022-12-20 NOTE — H&P (Signed)
History and Physical    Jill Hayes EAV:[id number] DOB: 27-Jan-1951 DOA: 12/20/2022  PCP: Ronnald Ramp, MD (Confirm with patient/family/NH records and if not entered, this has to be entered at [facility] point of entry) Patient coming from: Home  I have personally briefly reviewed patient's old medical records in [facility]  Chief Complaint: I fell and broke my leg  HPI: Jill Hayes is a 72 y.o. female with medical history significant of alcohol abuse, cigarette smoking, presented with mechanical fall and right hip pain.  Patient woke up this morning and going to kitchen but " legs gave up" and fell on the right side and hit right hip.  Denies any chest pain shortness of breath lightheadedness or any other prodromes and no LOC, no head injury.  She was able to sit up and then stand up on her own right after the fall and walk back to bathroom however started to feel extruding pain of the right hip and called ambulance.  At baseline, active, able to walk 15-20 minutes without shortness of breath.  ED Course: Afebrile, blood pressure elevated, nonhypoxic.  CT right hip showed comminuted impacted and displaced intertrochanteric fracture of proximal right femur.  Other trauma scan including CT head and neck negative for acute findings.  Review of Systems: As per HPI otherwise 14 point review of systems negative.    Past Medical History:  Diagnosis Date   Cataract    Depression     Past Surgical History:  Procedure Laterality Date   ABDOMINAL HYSTERECTOMY     CHOLECYSTECTOMY       reports that she has been smoking cigarettes. She started smoking about 50 years ago. She has a 100.9 pack-year smoking history. She has never used smokeless tobacco. She reports current alcohol use of about 35.0 standard drinks of alcohol per week. She reports that she does not use drugs.  Allergies  Allergen Reactions   Codeine Other (See Comments)    GI Upset    No family history on  file.   Prior to Admission medications   Medication Sig Start Date End Date Taking? Authorizing Provider  Magnesium 400 MG CAPS Take 2 capsules by mouth 1 day or 1 dose.   Yes [provider]  Multiple Vitamins-Minerals (CENTRUM ADULT 50+ MULTIGUMMIES) CHEW Chew 1 Dose by mouth 1 day or 1 dose.   Yes [provider]  acetaminophen (TYLENOL) 500 MG tablet Take 500-1,000 mg by mouth every 6 (six) hours as needed for mild pain or moderate pain.    [provider]  ibuprofen (ADVIL) 200 MG tablet Take 400-600 mg by mouth every 6 (six) hours as needed for mild pain or moderate pain.    [provider]    Physical Exam: Vitals:   12/20/22 0755 12/20/22 1010  BP: (!) 122/109   Pulse: 80   Resp: 18   Temp: (!) 97.4 F (36.3 C)   TempSrc: Oral   SpO2: 100%   Weight:  49.9 kg    Constitutional: NAD, calm, comfortable Vitals:   12/20/22 0755 12/20/22 1010  BP: (!) 122/109   Pulse: 80   Resp: 18   Temp: (!) 97.4 F (36.3 C)   TempSrc: Oral   SpO2: 100%   Weight:  49.9 kg   Eyes: PERRL, lids and conjunctivae normal ENMT: Mucous membranes are moist. Posterior pharynx clear of any exudate or lesions.Normal dentition.  Neck: normal, supple, no masses, no thyromegaly Respiratory: clear to auscultation bilaterally, no  wheezing, no crackles. Normal respiratory effort. No accessory muscle use.  Cardiovascular: Regular rate and rhythm, no murmurs / rubs / gallops. No extremity edema. 2+ pedal pulses. No carotid bruits.  Abdomen: no tenderness, no masses palpated. No hepatosplenomegaly. Bowel sounds positive.  Musculoskeletal: Right leg shortened Skin: no rashes, lesions, ulcers. No induration Neurologic: CN 2-12 grossly intact. Sensation intact, DTR normal. Strength 5/5 in all 4.  Psychiatric: Normal judgment and insight. Alert and oriented x 3. Normal mood.     Labs on Admission: I have personally reviewed following labs and imaging  studies  CBC: Recent Labs  Lab 12/20/22 0800  WBC 7.7  NEUTROABS 5.7  HGB 12.5  HCT 35.8*  MCV 94.0  PLT 164   Basic Metabolic Panel: Recent Labs  Lab 12/20/22 0800  NA 132*  K 3.6  CL 94*  CO2 21*  GLUCOSE 115*  BUN 11  CREATININE 0.76  CALCIUM 8.6*   GFR: Estimated Creatinine Clearance: 49.3 mL/min (by C-G formula based on SCr of 0.76 mg/dL). Liver Function Tests: Recent Labs  Lab 12/20/22 0800  AST 98*  ALT 39  ALKPHOS 107  BILITOT 0.4  PROT 6.4*  ALBUMIN 3.5   No results for input(s): "LIPASE", "AMYLASE" in the last 168 hours. No results for input(s): "AMMONIA" in the last 168 hours. Coagulation Profile: Recent Labs  Lab 12/20/22 0800  INR 1.1   Cardiac Enzymes: No results for input(s): "CKTOTAL", "CKMB", "CKMBINDEX", "TROPONINI" in the last 168 hours. BNP (last 3 results) No results for input(s): "PROBNP" in the last 8760 hours. HbA1C: No results for input(s): "HGBA1C" in the last 72 hours. CBG: No results for input(s): "GLUCAP" in the last 168 hours. Lipid Profile: No results for input(s): "CHOL", "HDL", "LDLCALC", "TRIG", "CHOLHDL", "LDLDIRECT" in the last 72 hours. Thyroid Function Tests: No results for input(s): "TSH", "T4TOTAL", "FREET4", "T3FREE", "THYROIDAB" in the last 72 hours. Anemia Panel: No results for input(s): "VITAMINB12", "FOLATE", "FERRITIN", "TIBC", "IRON", "RETICCTPCT" in the last 72 hours. Urine analysis:    Component Value Date/Time   COLORURINE AMBER (A) 03/19/2022 2025   APPEARANCEUR CLOUDY (A) 03/19/2022 2025   LABSPEC 1.013 03/19/2022 2025   PHURINE 7.0 03/19/2022 2025   GLUCOSEU NEGATIVE 03/19/2022 2025   HGBUR SMALL (A) 03/19/2022 2025   BILIRUBINUR NEGATIVE 03/19/2022 2025   KETONESUR 5 (A) 03/19/2022 2025   PROTEINUR 30 (A) 03/19/2022 2025   NITRITE NEGATIVE 03/19/2022 2025   LEUKOCYTESUR LARGE (A) 03/19/2022 2025    Radiological Exams on Admission: CT Hip Right Wo Contrast  Result Date:  12/20/2022 CLINICAL DATA:  Right hip trauma. EXAM: CT OF THE RIGHT HIP WITHOUT CONTRAST TECHNIQUE: Multidetector CT imaging of the right hip was performed according to the standard protocol. Multiplanar CT image reconstructions were also generated. RADIATION DOSE REDUCTION: This exam was performed according to the departmental dose-optimization program which includes automated exposure control, adjustment of the mA and/or kV according to patient size and/or use of iterative reconstruction technique. COMPARISON:  None Available. FINDINGS: Bones/Joint/Cartilage There is diffuse osteopenia of the visualized osseous structures. There is comminuted, impacted and displaced intertrochanteric fracture of the proximal right femur. No other acute fracture or dislocation. No aggressive osseous lesion. Ligaments Suboptimally assessed by CT. Muscles and Tendons There is small intramuscular hematoma surrounding the right proximal femur fracture. Soft tissues There is a 2.8 x 4.6 cm dystrophic calcification in the posterior pelvis. Uterus is not distinctly seen, which may be surgically absent or atrophic and obscured by streak artifacts from this  calcification. No other focal lesion seen within the visualized soft tissues. IMPRESSION: *Comminuted, impacted and displaced intertrochanteric fracture of the proximal right femur. *No other acute fracture or dislocation seen. Electronically Signed   By: Jules Schick M.D.   On: 12/20/2022 09:40   DG Chest 1 View  Result Date: 12/20/2022 CLINICAL DATA:  Fall. EXAM: CHEST  1 VIEW COMPARISON:  Chest radiograph dated February 19, 2006. FINDINGS: The heart size and mediastinal contours are within normal limits. No focal consolidation. No pleural effusion or pneumothorax. No acute osseous abnormality. IMPRESSION: No acute findings in the chest. Electronically Signed   By: Hart Robinsons M.D.   On: 12/20/2022 09:38   CT Cervical Spine Wo Contrast  Result Date: 12/20/2022 CLINICAL  DATA:  Head trauma, minor (Age >= 65y); Neck trauma (Age >= 65y) EXAM: CT HEAD WITHOUT CONTRAST CT CERVICAL SPINE WITHOUT CONTRAST TECHNIQUE: Multidetector CT imaging of the head and cervical spine was performed following the standard protocol without intravenous contrast. Multiplanar CT image reconstructions of the cervical spine were also generated. RADIATION DOSE REDUCTION: This exam was performed according to the departmental dose-optimization program which includes automated exposure control, adjustment of the mA and/or kV according to patient size and/or use of iterative reconstruction technique. COMPARISON:  CT scan head from 03/19/2022. FINDINGS: CT HEAD FINDINGS Brain: No evidence of acute infarction, hemorrhage, hydrocephalus, extra-axial collection or mass lesion/mass effect. There is bilateral periventricular hypodensity, which is non-specific but most likely seen in the settings of microvascular ischemic changes. Mild in extent. 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 Otherwise normal appearance of brain parenchyma. Ventricles are normal. Cerebral volume is age appropriate. Vascular: No hyperdense vessel or unexpected calcification. Intracranial arteriosclerosis. Skull: Normal. Negative for fracture or focal lesion. Sinuses/Orbits: No acute finding. There is moderate mucoperiosteal thickening in the right maxillary sinus and mild mucoperiosteal thickening in the bilateral ethmoidal air cells, bilateral frontal sinus and right chamber of the sphenoid sinus. Other: Visualized mastoid air cells are unremarkable. No mastoid effusion. CT CERVICAL SPINE FINDINGS Alignment: There is straightening of cervical lordosis, which may be positional or due to muscle spasm. This examination does not assess for ligamentous injury or stability. Skull base and vertebrae: No acute fracture. No primary bone lesion or focal pathologic process. Soft tissues and spinal canal: No prevertebral  fluid or swelling. No visible canal hematoma. Disc levels: Mild-to-moderately reduced C3-4 and C4-5 intervertebral disc heights. Mildly reduced remaining intervertebral disc heights. Mild multilevel facet arthropathy and marginal osteophyte formation. Upper chest: Paraseptal emphysematous changes noted in the visualized bilateral lung apices. There is also pleural-parenchymal disease in bilateral lung apices, right more than left. Other: None IMPRESSION: *No acute intracranial abnormality. *No acute fracture or traumatic listhesis of the cervical spine. *Multiple other nonacute observations, as described above. Electronically Signed   By: Jules Schick M.D.   On: 12/20/2022 09:28   CT Head Wo Contrast  Result Date: 12/20/2022 CLINICAL DATA:  Head trauma, minor (Age >= 65y); Neck trauma (Age >= 65y) EXAM: CT HEAD WITHOUT CONTRAST CT CERVICAL SPINE WITHOUT CONTRAST TECHNIQUE: Multidetector CT imaging of the head and cervical spine was performed following the standard protocol without intravenous contrast. Multiplanar CT image reconstructions of the cervical spine were also generated. RADIATION DOSE REDUCTION: This exam was performed according to the departmental dose-optimization program which includes automated exposure control, adjustment of the mA and/or kV according to patient size and/or use of iterative reconstruction technique. COMPARISON:  CT scan head from 03/19/2022. FINDINGS: CT  HEAD FINDINGS Brain: No evidence of acute infarction, hemorrhage, hydrocephalus, extra-axial collection or mass lesion/mass effect. There is bilateral periventricular hypodensity, which is non-specific but most likely seen in the settings of microvascular ischemic changes. Mild in extent. 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 Otherwise normal appearance of brain parenchyma. Ventricles are normal. Cerebral volume is age appropriate. Vascular: No hyperdense vessel or unexpected calcification.  Intracranial arteriosclerosis. Skull: Normal. Negative for fracture or focal lesion. Sinuses/Orbits: No acute finding. There is moderate mucoperiosteal thickening in the right maxillary sinus and mild mucoperiosteal thickening in the bilateral ethmoidal air cells, bilateral frontal sinus and right chamber of the sphenoid sinus. Other: Visualized mastoid air cells are unremarkable. No mastoid effusion. CT CERVICAL SPINE FINDINGS Alignment: There is straightening of cervical lordosis, which may be positional or due to muscle spasm. This examination does not assess for ligamentous injury or stability. Skull base and vertebrae: No acute fracture. No primary bone lesion or focal pathologic process. Soft tissues and spinal canal: No prevertebral fluid or swelling. No visible canal hematoma. Disc levels: Mild-to-moderately reduced C3-4 and C4-5 intervertebral disc heights. Mildly reduced remaining intervertebral disc heights. Mild multilevel facet arthropathy and marginal osteophyte formation. Upper chest: Paraseptal emphysematous changes noted in the visualized bilateral lung apices. There is also pleural-parenchymal disease in bilateral lung apices, right more than left. Other: None IMPRESSION: *No acute intracranial abnormality. *No acute fracture or traumatic listhesis of the cervical spine. *Multiple other nonacute observations, as described above. Electronically Signed   By: Jules Schick M.D.   On: 12/20/2022 09:28   DG Femur Min 2 Views Right  Result Date: 12/20/2022 CLINICAL DATA:  Right hip and mid shaft femur pain after fall. EXAM: RIGHT FEMUR 2 VIEWS; PELVIS - 1-2 VIEW COMPARISON:  None Available. FINDINGS: There is acute comminuted, impacted and angulated intertrochanteric fracture of the right proximal femur. Pelvis is intact with normal and symmetric sacroiliac joints. No other acute fracture or dislocation. No aggressive osseous lesion. Visualized sacral arcuate lines are unremarkable. There are mild  degenerative changes bilateral hip joints and right knee joints. No radiopaque foreign bodies. Vascular calcifications noted. 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 IMPRESSION: *Comminuted and angulated intertrochanteric fracture of the right proximal femur. Electronically Signed   By: Jules Schick M.D.   On: 12/20/2022 09:18   DG Pelvis 1-2 Views  Result Date: 12/20/2022 CLINICAL DATA:  Right hip and mid shaft femur pain after fall. EXAM: RIGHT FEMUR 2 VIEWS; PELVIS - 1-2 VIEW COMPARISON:  None Available. FINDINGS: There is acute comminuted, impacted and angulated intertrochanteric fracture of the right proximal femur. Pelvis is intact with normal and symmetric sacroiliac joints. No other acute fracture or dislocation. No aggressive osseous lesion. Visualized sacral arcuate lines are unremarkable. There are mild degenerative changes bilateral hip joints and right knee joints. No radiopaque foreign bodies. Vascular calcifications noted. 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 IMPRESSION: *Comminuted and angulated intertrochanteric fracture of the right proximal femur. Electronically Signed   By: Jules Schick M.D.   On: 12/20/2022 09:18    EKG: Independently reviewed.  Sinus rhythm, no acute ST changes, borderline QTc prolongation.  Assessment/Plan Principal Problem:   Hip fracture (HCC) Active Problems:   Closed right hip fracture (HCC)  (please populate well all problems here in Problem List. (For example, if patient is on BP meds at home and you  resume or decide to hold them, it is a problem that needs to be her. Same for CAD, COPD, HLD and so on)  Right femoral intertrochanteric fracture -Secondary to mechanical fall.  Denied any symptoms of prodromes of syncope and near syncope. -Telemetry x 24 hours to rule out significant arrhythmia. -Able to  tolerate 4 METS activity at baseline, medically cleared for right hip ORIF under general anesthesia with acceptable medical risk. -Start aspirin, start small dose of beta-blocker  Alcohol abuse -Last drink was last night, currently there is no symptoms signs of alcohol withdrawal. -Start CIWA with as needed benzos  Cigarette smoking -Cessation education performed at bedside -Start nicotine patch  DVT prophylaxis: Lovenox Code Status: Full code Family Communication: None at bedside Disposition Plan: Expect more than 2 midnight hospital stay, as patient is sick with right hip fracture requiring ORIF. Consults called: Orthopedic surgery Admission status: Telemetry admission   Emeline General MD [facility] Pager [phone]  12/20/2022, 10:34 AM

## 2022-12-20 NOTE — Consult Note (Signed)
Ortho Note  Is a 72 year old female who lives alone patient fell this morning and she does not recall tripping she said she just remembers being on the floor and unable to get up.  Patient does not normally use a walker or cane she lives alone but has a son and her sister who are present they both live nearby.  According to the sister patient has a history of frequent falls and does not remember what happened after the falls.  Patient denied any dizziness or blurred vision prior to the fall.  She denied the leg giving away or tripping.  Past medical history is significant only for smoking 2 packs a day for over 50 years and the use of alcohol.  She denies any history of Dts  She has no known drug allergies that she has an intolerance of codeine it irritates her stomach.  Previous surgery hysterectomy many years ago  Social history she lives alone and has a son and sister who live nearby.  Social history significant for smoking and use of alcohol.  The patient denied drinking every day but admits to drinking several times a week.  On physical exam  She is alert and oriented x 3 in no acute distress.  Her right lower extremity shortened and externally rotated.  She has full dorsiflexion of her ankle and excellent pulses.  Skin is normal there is no bruising around the hip she does have pain with any movement of the hip.  Labs: Hct 35.8,  PT with INR normal   X-rays and CT scan reveal a displaced intertrochanteric fracture of the right proximal femur  Impression right displaced intertrochanteric hip fracture  Recommendations patient will be taken to surgery today for a right TFN.  The potential risks and complications of the surgery were explained to the patient and her family they understand there is a risk of infection nerve or blood vessel damage bleeding and the possible need for a blood transfusion both during the surgery and possibly afterwards.  The other risks include nonunion malunion  failure of the hardware and the need for additional surgery.  Because she is a smoker I anticipate that the bone quality will be poor and then increases the chances of cut out of the screw.  In addition the smoking may lead to delayed healing both of the bone and of the wound.  I discussed the need correction I discussed the possibility of a blood clot and the need for a blood thinner postoperatively.  This could either be Lovenox 1 aspirin depending on the preference of the hospitalist.  With the use of a blood thinner that is increased the chances of bleeding and problems with the wound healing as well.  We discussed possibility of the leg being being a little bit shorter on the right side than the left side.  Patient understands that after surgery she will need to walk with a walker and typically it takes 3 months for these fractures to heal but because she is a smoker it may take even longer.  We also recommended that she go to some type of rehab facilities postoperatively for several weeks to help with ambulation.  The patient and her family appear to understand the potential risks and complications of the surgery and wished to proceed with the surgery.  Of talk and I have talked to the operating room and the plan is for surgery this afternoon.  Patient will be kept n.p.o. she last ate last evening.

## 2022-12-20 NOTE — Op Note (Signed)
   PRE-OPERATIVE DIAGNOSIS:  right intertrochanteric hip fracture   POST-OPERATIVE DIAGNOSIS:  right intertrochanteric hip fracture   PROCEDURE:  Procedure(s) with comments: OPEN REDUCTION INTERNAL FIXATION HIP (Right) - intermediate TFN ( Synthes) with C- arm  and asssistant   SURGEON:  Surgeons and Role:    * Cova Knieriem, Windy Canny, MD - Primary   PHYSICIAN ASSISTANT: None     ANESTHESIA:   general   EBL:  100 mL    BLOOD ADMINISTERED:none   DRAINS: none    LOCAL MEDICATIONS USED:  LIDOCAINE  and Amount: 30 ml  Implants: Synthes intermediate TFN size 9 with a 95 helical blade   SPECIMEN:  No Specimen   DISPOSITION OF SPECIMEN:  N/A   COUNTS:  YES    Patient was brought to the operating room.  She underwent general anesthesia via endotracheal intubation.  Patient was placed on the fracture table and the right leg was placed in traction boot.  The fracture was reduced using longitudinal traction and internal rotation of the leg.  X-rays were obtained under fluoroscopy in both AP and the lateral views to confirm that the fracture was satisfactory reduced.  At that point the right hip and leg were prepped and draped in the normal sterile manner.  A timeout was performed and then the incision was made just superior to the greater trochanter.  I dissected down through the subcutaneous tissue and then we extended it down to  the fascia.  I put a guidepin right over the tip of the greater trochanter.  I looked at the position on both AP and the lateral view using fluoroscopy.  Once it was in a good position I used a mallet to impact the guidepin down to the area of the lesser trochanter.  I confirmed the position on the on x-rays before proceeding.  Once I was happy with the position I then reamed over the guidepin.  Patient had a small canal and was  very small , so I  elected to go with a size 9 intermediate Synthes TFN nail since she had some osteoporosis.  I inserted the nail over the guide  pin and then removed the guide pin.  I confirmed the position of the nail again under fluoroscopy. The fracture  alignment  was good. The fracture was anatomically reduced.  Then put on the guide for inserting the helical blade.  I drilled and sized it and then impacted  the  95 mm helical blade.  We used a size 95 helical blade.  The helical blade was inserted I then put in the locking screw proximally and then put a distal locking screw.  The guide was disassembled . We obtained x-ray intraoperatively showing that the fracture was anatomically reduced.  We had made 2 incisions 1 proximal and distal for the locking screws.  I Irrigated  both incisions with antibiotic solution then injected 30 cc of half percent Marcaine to help with postop pain relief.  All bleeders were anticoagulated.  I then closed the fascia with #1 Vicryl interrupted sutures.  Subcutaneous tissue was closed with 2-0 Vicryl and the skin was closed with staples. The incision was cleaned and a  dry dressing was applied . The patient tolerated the procedure without any problems . She was transferred to recovery in stable condition.

## 2022-12-20 NOTE — Anesthesia Procedure Notes (Signed)
Procedure Name: Intubation Date/Time: 12/20/2022 1:15 PM  Performed by: Irving Burton, CRNAPre-anesthesia Checklist: Patient identified, Patient being monitored, Timeout performed, Emergency Drugs available and Suction available Patient Re-evaluated:Patient Re-evaluated prior to induction Oxygen Delivery Method: Circle system utilized Preoxygenation: Pre-oxygenation with 100% oxygen Induction Type: IV induction Ventilation: Mask ventilation without difficulty Laryngoscope Size: 3 and McGrath Grade View: Grade I Tube type: Oral Tube size: 6.5 mm Number of attempts: 1 Airway Equipment and Method: Stylet Placement Confirmation: ETT inserted through vocal cords under direct vision, positive ETCO2 and breath sounds checked- equal and bilateral Secured at: 20 cm Tube secured with: Tape Dental Injury: Teeth and Oropharynx as per pre-operative assessment

## 2022-12-20 NOTE — Transfer of Care (Signed)
Immediate Anesthesia Transfer of Care Note  Patient: Jill Hayes  Procedure(s) Performed: OPEN REDUCTION INTERNAL FIXATION HIP (Right: Hip)  Patient Location: PACU  Anesthesia Type:General  Level of Consciousness: awake, alert , and oriented  Airway & Oxygen Therapy: Patient Spontanous Breathing  Post-op Assessment: Report given to RN and Post -op Vital signs reviewed and stable  Post vital signs: Reviewed and stable  Last Vitals:  Vitals Value Taken Time  BP 162/63 12/20/22 1548  Temp    Pulse 95 12/20/22 1552  Resp 24 12/20/22 1552  SpO2 96 % 12/20/22 1552  Vitals shown include unfiled device data.  Last Pain:  Vitals:   12/20/22 1302  TempSrc: Tympanic  PainSc: 9       Patients Stated Pain Goal: 0 (12/20/22 1302)  Complications: No notable events documented.

## 2022-12-21 DIAGNOSIS — S72001A Fracture of unspecified part of neck of right femur, initial encounter for closed fracture: Secondary | ICD-10-CM | POA: Diagnosis not present

## 2022-12-21 LAB — CBC
HCT: 23.2 % — ABNORMAL LOW (ref 36.0–46.0)
Hemoglobin: 8.1 g/dL — ABNORMAL LOW (ref 12.0–15.0)
MCH: 32.9 pg (ref 26.0–34.0)
MCHC: 34.9 g/dL (ref 30.0–36.0)
MCV: 94.3 fL (ref 80.0–100.0)
Platelets: 159 10*3/uL (ref 150–400)
RBC: 2.46 MIL/uL — ABNORMAL LOW (ref 3.87–5.11)
RDW: 13.2 % (ref 11.5–15.5)
WBC: 7.8 10*3/uL (ref 4.0–10.5)
nRBC: 0 % (ref 0.0–0.2)

## 2022-12-21 LAB — BASIC METABOLIC PANEL
Anion gap: 13 (ref 5–15)
BUN: 15 mg/dL (ref 8–23)
CO2: 24 mmol/L (ref 22–32)
Calcium: 7.8 mg/dL — ABNORMAL LOW (ref 8.9–10.3)
Chloride: 94 mmol/L — ABNORMAL LOW (ref 98–111)
Creatinine, Ser: 0.97 mg/dL (ref 0.44–1.00)
GFR, Estimated: >60 mL/min (ref >60)
Glucose, Bld: 186 mg/dL — ABNORMAL HIGH (ref 70–99)
Potassium: 4.7 mmol/L (ref 3.5–5.1)
Sodium: 131 mmol/L — ABNORMAL LOW (ref 135–145)

## 2022-12-21 MED ORDER — ENSURE ENLIVE PO LIQD
237.0000 mL | Freq: Three times a day (TID) | ORAL | Status: DC
  Administered 2022-12-21 – 2022-12-28 (×17): 237 mL via ORAL

## 2022-12-21 NOTE — Evaluation (Signed)
Occupational Therapy Evaluation Patient Details Name: Jill Hayes MRN: [id number] DOB: 14-Feb-1950 Today's Date: 12/21/2022   History of Present Illness Pt is a 72 y.o. female presented with mechanical fall and R hip pain, now s/p R hip ORIF. PMH includes alcohol abuse, cigarette smoking, anxiety, depression, and cataracts.   Clinical Impression   Jill Hayes was seen for OT evaluation this date. Prior to hospital admission, pt was IND. Pt lives alone. On arrival pt attempting to exit bed citing need for bed pan. Pt currently requires MOD A for BSC t/f, CGA for pericare sitting. Pt is impulsive and pain limited, RN notified 8/10 pain. Pt would benefit from skilled OT to address noted impairments and functional limitations (see below for any additional details). Upon hospital discharge, recommend OT follow up.    If plan is discharge home, recommend the following: A lot of help with walking and/or transfers;A lot of help with bathing/dressing/bathroom    Functional Status Assessment  Patient has had a recent decline in their functional status and demonstrates the ability to make significant improvements in function in a reasonable and predictable amount of time.  Equipment Recommendations  BSC/3in1    Recommendations for Other Services       Precautions / Restrictions Precautions Precautions: Fall Restrictions Weight Bearing Restrictions: Yes RLE Weight Bearing: Weight bearing as tolerated      Mobility Bed Mobility Overal bed mobility: Needs Assistance Bed Mobility: Supine to Sit, Sit to Supine     Supine to sit: Min assist Sit to supine: Mod assist   General bed mobility comments: pt pitched backwards into bed citing RLE pain, MOD A to control descent    Transfers Overall transfer level: Needs assistance Equipment used: Rolling walker (2 wheels) Transfers: Bed to chair/wheelchair/BSC   Stand pivot transfers: Mod assist         General transfer comment:  impulsive      Balance Overall balance assessment: Needs assistance Sitting-balance support: No upper extremity supported, Feet supported Sitting balance-Leahy Scale: Good     Standing balance support: Bilateral upper extremity supported Standing balance-Leahy Scale: Poor                             ADL either performed or assessed with clinical judgement   ADL Overall ADL's : Needs assistance/impaired                                       General ADL Comments: MOD A for BSC t/f, CGA for pericare sitting. MAX A for LB access at bed level.      Pertinent Vitals/Pain Pain Assessment Pain Assessment: 0-10 Pain Score: 8  Pain Location: R hip Pain Descriptors / Indicators: Aching, Discomfort, Grimacing, Guarding, Sore Pain Intervention(s): Limited activity within patient's tolerance, Repositioned, Patient requesting pain meds-RN notified, RN gave pain meds during session     Extremity/Trunk Assessment Upper Extremity Assessment Upper Extremity Assessment: Overall WFL for tasks assessed   Lower Extremity Assessment Lower Extremity Assessment: Generalized weakness RLE Deficits / Details: did not fully assess due to expected R hip pain/weakness post-surgery       Communication Communication Communication: No apparent difficulties Cueing Techniques: Verbal cues   Cognition Arousal: Alert Behavior During Therapy: WFL for tasks assessed/performed, Anxious Overall Cognitive Status: No family/caregiver present to determine baseline cognitive functioning  General Comments: Highly distracted by pain, poor safety awareness                Home Living Family/patient expects to be discharged to:: Private residence Living Arrangements: Alone Available Help at Discharge: Family;Available PRN/intermittently Type of Home: Mobile home Home Access: Stairs to enter Entrance Stairs-Number of Steps: 4  (front), 6-7 (back) Entrance Stairs-Rails: Right;Left;Can reach both Home Layout: One level     Bathroom Shower/Tub: Producer, television/film/video: Standard     Home Equipment: Agricultural consultant (2 wheels);Toilet riser          Prior Functioning/Environment Prior Level of Function : Independent/Modified Independent;Driving             Mobility Comments: Pt reported ind community ambulator with no AD; reported 1 fall in last 6 mo resulting in admission ADLs Comments: Pt reported ind with all        OT Problem List: Decreased strength;Decreased range of motion;Decreased activity tolerance;Impaired balance (sitting and/or standing);Decreased safety awareness;Decreased cognition      OT Treatment/Interventions: Self-care/ADL training;Therapeutic exercise;Energy conservation;DME and/or AE instruction;Therapeutic activities;Patient/family education;Balance training    OT Goals(Current goals can be found in the care plan section) Acute Rehab OT Goals Patient Stated Goal: to improve pain OT Goal Formulation: With patient Time For Goal Achievement: 01/04/23 Potential to Achieve Goals: Fair ADL Goals Pt Will Perform Grooming: with modified independence;standing Pt Will Perform Lower Body Dressing: with modified independence;sit to/from stand Pt Will Transfer to Toilet: with modified independence;ambulating;regular height toilet  OT Frequency: Min 1X/week    Co-evaluation              AM-PAC OT "6 Clicks" Daily Activity     Outcome Measure Help from another person eating meals?: None Help from another person taking care of personal grooming?: A Little Help from another person toileting, which includes using toliet, bedpan, or urinal?: A Lot Help from another person bathing (including washing, rinsing, drying)?: A Lot Help from another person to put on and taking off regular upper body clothing?: A Little Help from another person to put on and taking off regular lower  body clothing?: A Lot 6 Click Score: 16   End of Session Nurse Communication: Patient requests pain meds  Activity Tolerance: Patient limited by pain;Patient tolerated treatment well Patient left: in bed;with call bell/phone within reach;with bed alarm set  OT Visit Diagnosis: Other abnormalities of gait and mobility (R26.89);Muscle weakness (generalized) (M62.81)                Time: 4166-0630 OT Time Calculation (min): 14 min Charges:  OT General Charges $OT Visit: 1 Visit OT Evaluation $OT Eval Moderate Complexity: 1 Mod  Kathie Dike, M.S. OTR/L  12/21/22, 2:14 PM  ascom [phone]

## 2022-12-21 NOTE — Progress Notes (Signed)
Physical Therapy Treatment Patient Details Name: Jill Hayes MRN: [id number] DOB: [birth year] Today's Date: 12/21/2022   History of Present Illness Pt is a 72 y.o. female presented with mechanical fall and R hip pain, now s/p R hip ORIF. PMH includes alcohol abuse, cigarette smoking, anxiety, depression, and cataracts.    PT Comments  Pt was willing to participate during session despite perseverating on pain and exhibiting discomfort throughout. Pt was able to perform supine>sit with min A for RLE and trunk support after exhibiting difficulty. Pt was able to perform transfer with mod A from elevated surface due to unsteadiness and lack of confidence, but able to stand for brief period of time with light assist and RW. Pt declined gait training this session due to overall discomfort/pain. Pt reported mild lightheadedness with sitting that resolved quickly, and no other adverse symptoms during session other than pain. Small amount of blood observed from pt's surgical site, RN notified and attending to site at end of session. Pt will benefit from continued PT services upon discharge to safely address deficits listed in patient problem list for decreased caregiver assistance and eventual return to PLOF.     If plan is discharge home, recommend the following: Help with stairs or ramp for entrance;Assist for transportation;Assistance with cooking/housework;A little help with bathing/dressing/bathroom;A little help with walking and/or transfers   Can travel by private vehicle     No  Equipment Recommendations       Recommendations for Other Services       Precautions / Restrictions Precautions Precautions: Fall Restrictions Weight Bearing Restrictions: Yes RLE Weight Bearing: Weight bearing as tolerated     Mobility  Bed Mobility Overal bed mobility: Needs Assistance Bed Mobility: Supine to Sit, Sit to Supine     Supine to sit: Min assist, HOB elevated, Used rails Sit to supine: Max  assist   General bed mobility comments: supine>sit performed incrementally but capable strength, req assist for RLE management to decrease pain and light trunk assist to complete; sit>supine with assist for BLEs and trunk support due to pt insisting on laying back down and perseverating on pain    Transfers Overall transfer level: Needs assistance Equipment used: Rolling walker (2 wheels) Transfers: Sit to/from Stand Sit to Stand: Mod assist, From elevated surface           General transfer comment: pt given lots of encouragement to complete, pt exhibiting some shakiness and lack of confidence throughout despite ability to push through BUEs and maintain brief standing; cuing for hand and feet placement    Ambulation/Gait               General Gait Details: pt declined   Stairs             Wheelchair Mobility     Tilt Bed    Modified Rankin (Stroke Patients Only)       Balance Overall balance assessment: Needs assistance Sitting-balance support: No upper extremity supported, Feet supported Sitting balance-Leahy Scale: Good Sitting balance - Comments: pt impulsive and wavering throughout, no LOB at EOB   Standing balance support: Bilateral upper extremity supported, Reliant on assistive device for balance Standing balance-Leahy Scale: Poor Standing balance comment: pt able to maintain static stand with RW for ~15 secs with min-mod A due to shakiness, pt insisted on sitting back down  Cognition Arousal: Alert Behavior During Therapy: WFL for tasks assessed/performed, Anxious Overall Cognitive Status: Within Functional Limits for tasks assessed                                 General Comments: Highly distracted by pain, poor safety awareness        Exercises Total Joint Exercises Long Arc Quad: AROM, Both, 10 reps, Seated Other Exercises Other Exercises: Pt educated on benefit of early mobilty  following surgery for improved healing and decreased pain, as well as WBAT precuation for RLE    General Comments        Pertinent Vitals/Pain Pain Assessment Pain Assessment: 0-10 Pain Score: 8  Pain Location: R hip Pain Descriptors / Indicators: Aching, Discomfort, Grimacing, Guarding, Sore Pain Intervention(s): Monitored during session, Premedicated before session, Limited activity within patient's tolerance    Home Living Family/patient expects to be discharged to:: Private residence Living Arrangements: Alone Available Help at Discharge: Family;Available PRN/intermittently Type of Home: Mobile home Home Access: Stairs to enter Entrance Stairs-Rails: Right;Left;Can reach both Entrance Stairs-Number of Steps: 4 (front), 6-7 (back)   Home Layout: One level Home Equipment: Agricultural consultant (2 wheels);Toilet riser      Prior Function            PT Goals (current goals can now be found in the care plan section) Acute Rehab PT Goals Patient Stated Goal: get stronger/back to walking PT Goal Formulation: With patient Time For Goal Achievement: 01/03/23 Potential to Achieve Goals: Good Progress towards PT goals: Progressing toward goals    Frequency    BID      PT Plan      Co-evaluation              AM-PAC PT "6 Clicks" Mobility   Outcome Measure  Help needed turning from your back to your side while in a flat bed without using bedrails?: A Little Help needed moving from lying on your back to sitting on the side of a flat bed without using bedrails?: A Lot Help needed moving to and from a bed to a chair (including a wheelchair)?: A Lot Help needed standing up from a chair using your arms (e.g., wheelchair or bedside chair)?: A Little Help needed to walk in hospital room?: A Lot Help needed climbing 3-5 steps with a railing? : A Lot 6 Click Score: 14    End of Session Equipment Utilized During Treatment: Gait belt Activity Tolerance: Patient limited by  pain Patient left: in bed;with call bell/phone within reach;with nursing/sitter in room;with family/visitor present Nurse Communication: Mobility status;Other (comment) (light bleeding from R hip incision) PT Visit Diagnosis: Muscle weakness (generalized) (M62.81);Other abnormalities of gait and mobility (R26.89);Pain Pain - Right/Left: Right Pain - part of body: Hip     Time: 4403-4742 PT Time Calculation (min) (ACUTE ONLY): 21 min  Charges:                           Rosiland Oz SPT 12/21/22, 4:59 PM

## 2022-12-21 NOTE — Evaluation (Signed)
Physical Therapy Evaluation Patient Details Name: Jill Hayes MRN: [id number] DOB: 05/30/1950 Today's Date: 12/21/2022  History of Present Illness  Pt is a 72 y.o. female presented with mechanical fall and R hip pain, now s/p R hip ORIF. PMH includes alcohol abuse, cigarette smoking, anxiety, depression, and cataracts.  Clinical Impression  Pt was pleasant and willing to attempt mobility during the session, putting forth fair effort despite being limited by pain. Pt presented A,O x4 and was able to give detailed history. Pt was able to complete supine>sit with min A for trunk support and extra time for RLE management. Pt declined to attempt transfers due to discomfort and desire to lay back in bed. Pt completed sit>supine with mod A for trunk and RLE support, and received +2 physical assist only for scooting up in bed. Pt vitals were monitored during session and remained WNL, pt reporting mild lightheadedness upon sitting that improved quickly and exhibited persistent pain throughout. Pt will benefit from continued PT services upon discharge to safely address deficits listed in patient problem list for decreased caregiver assistance and eventual return to PLOF.       If plan is discharge home, recommend the following: Help with stairs or ramp for entrance;Assist for transportation;Assistance with cooking/housework;A little help with bathing/dressing/bathroom;A little help with walking and/or transfers   Can travel by private vehicle   No    Equipment Recommendations    Recommendations for Other Services       Functional Status Assessment Patient has had a recent decline in their functional status and demonstrates the ability to make significant improvements in function in a reasonable and predictable amount of time.     Precautions / Restrictions Precautions Precautions: Fall Restrictions Weight Bearing Restrictions: Yes RLE Weight Bearing: Weight bearing as tolerated       Mobility  Bed Mobility Overal bed mobility: Needs Assistance Bed Mobility: Supine to Sit, Sit to Supine     Supine to sit: Min assist, HOB elevated, Used rails Sit to supine: Mod assist, +2 for physical assistance   General bed mobility comments: supine>sit was performed slowly and incrementally, pt exhibiting severe discomfort throughout, able to complete with light HHA x1 assist and minimal trunk steadying; pt self-initiated sit>supine req RLE and trunk assist to complete safely with extra time, +2 physical assist only needed for scooting pt up in bed at end of session    Transfers                   General transfer comment: pt declined due to discomfort    Ambulation/Gait                  Stairs            Wheelchair Mobility     Tilt Bed    Modified Rankin (Stroke Patients Only)       Balance Overall balance assessment: Needs assistance Sitting-balance support: Feet supported, Single extremity supported, Bilateral upper extremity supported (intermittent BUE support) Sitting balance-Leahy Scale: Good Sitting balance - Comments: pt able to static sit EOB for ~3 mins with no assist, observable shakiness and impulsive wavering       Standing balance comment: n/a                             Pertinent Vitals/Pain Pain Assessment Pain Assessment: 0-10 Pain Score: 6  Pain Location: R hip Pain Descriptors / Indicators: Aching, Discomfort, Grimacing,  Guarding, Sore Pain Intervention(s): Limited activity within patient's tolerance, Monitored during session, Patient requesting pain meds-RN notified, RN gave pain meds during session    Home Living Family/patient expects to be discharged to:: Private residence Living Arrangements: Alone Available Help at Discharge: Family;Available PRN/intermittently Type of Home: Mobile home Home Access: Stairs to enter Entrance Stairs-Rails: Right;Left;Can reach both (both front and back) Entrance  Stairs-Number of Steps: 4 (front), 6-7 (back)   Home Layout: One level Home Equipment: Agricultural consultant (2 wheels);Toilet riser      Prior Function Prior Level of Function : Independent/Modified Independent;Driving             Mobility Comments: Pt reported ind community ambulator with no AD; reported 1 fall in last 6 mo resulting in admission ADLs Comments: Pt reported ind with all     Extremity/Trunk Assessment   Upper Extremity Assessment Upper Extremity Assessment: Defer to OT evaluation    Lower Extremity Assessment Lower Extremity Assessment: Generalized weakness;RLE deficits/detail RLE Deficits / Details: did not fully assess due to expected R hip pain/weakness post-surgery       Communication   Communication Communication: No apparent difficulties Cueing Techniques: Verbal cues  Cognition Arousal: Alert Behavior During Therapy: WFL for tasks assessed/performed, Anxious Overall Cognitive Status: Within Functional Limits for tasks assessed                                          General Comments      Exercises     Assessment/Plan    PT Assessment Patient needs continued PT services  PT Problem List Decreased strength;Decreased coordination;Decreased range of motion;Decreased activity tolerance;Decreased cognition;Decreased balance;Decreased mobility;Decreased safety awareness;Decreased knowledge of use of DME;Pain;Decreased skin integrity       PT Treatment Interventions DME instruction;Gait training;Stair training;Functional mobility training;Therapeutic activities;Therapeutic exercise;Patient/family education;Neuromuscular re-education;Balance training    PT Goals (Current goals can be found in the Care Plan section)  Acute Rehab PT Goals Patient Stated Goal: get stronger/back to walking PT Goal Formulation: With patient Time For Goal Achievement: 01/03/23 Potential to Achieve Goals: Good    Frequency BID     Co-evaluation                AM-PAC PT "6 Clicks" Mobility  Outcome Measure Help needed turning from your back to your side while in a flat bed without using bedrails?: A Little Help needed moving from lying on your back to sitting on the side of a flat bed without using bedrails?: A Lot Help needed moving to and from a bed to a chair (including a wheelchair)?: A Lot Help needed standing up from a chair using your arms (e.g., wheelchair or bedside chair)?: A Little Help needed to walk in hospital room?: A Lot Help needed climbing 3-5 steps with a railing? : A Lot 6 Click Score: 14    End of Session Equipment Utilized During Treatment: Gait belt Activity Tolerance: Patient limited by pain Patient left: in bed;with call bell/phone within reach;with bed alarm set;with SCD's reapplied Nurse Communication: Mobility status;Patient requests pain meds PT Visit Diagnosis: Muscle weakness (generalized) (M62.81);Other abnormalities of gait and mobility (R26.89);Pain Pain - Right/Left: Right Pain - part of body: Hip    Time: 1610-9604 PT Time Calculation (min) (ACUTE ONLY): 31 min   Charges:               Rosiland Oz SPT 12/21/22, 1:43  PM

## 2022-12-21 NOTE — TOC Progression Note (Signed)
Transition of Care [facility]) - Progression Note    Patient Details  Name: Jill Hayes MRN: [id number] Date of Birth: 22-Feb-1950  Transition of Care [facility]) CM/SW Contact  Marlowe Sax, RN Phone Number: 12/21/2022, 2:19 PM  Clinical Narrative:     Met with the patient in the room to discuss DC plan and needs, she is agreeable to go to [facility], does not want to go to [facility] or [facility], also does not want to go to [place], FL2 completed, Bedsearch sent, will review bed offers once obtained  Expected Discharge Plan: Skilled Nursing Facility Barriers to Discharge: SNF Pending bed offer, Insurance Authorization  Expected Discharge Plan and Services   Discharge Planning Services: CM Consult   Living arrangements for the past 2 months: Single Family Home                   DME Agency: NA       HH Arranged: NA           Social Determinants of Health (SDOH) Interventions SDOH Screenings   Food Insecurity: No Food Insecurity (12/20/2022)  Housing: Low Risk  (12/20/2022)  Transportation Needs: No Transportation Needs (12/20/2022)  Utilities: Not At Risk (12/20/2022)  Alcohol Screen: Low Risk  (09/18/2022)  Depression (PHQ2-9): Low Risk  (09/18/2022)  Financial Resource Strain: Low Risk  (09/18/2022)  Physical Activity: Inactive (09/18/2022)  Social Connections: Socially Isolated (09/18/2022)  Stress: No Stress Concern Present (09/18/2022)  Tobacco Use: High Risk (12/20/2022)  Health Literacy: Adequate Health Literacy (09/18/2022)    Readmission Risk Interventions     No data to display

## 2022-12-21 NOTE — Progress Notes (Signed)
Progress Note   Patient: Jill Hayes CBJ:[id number] DOB: 1950-05-31 DOA: 12/20/2022     1 DOS: the patient was seen and examined on 12/21/2022   Brief hospital course:   Jill Hayes is a 72 y.o. female with medical history significant of alcohol abuse, cigarette smoking, presented with mechanical fall and right hip pain.   Patient woke up this morning and going to kitchen but " legs gave up" and fell on the right side and hit right hip.  Denies any chest pain shortness of breath lightheadedness or any other prodromes and no LOC, no head injury.  She was able to sit up and then stand up on her own right after the fall and walk back to bathroom however started to feel extruding pain of the right hip and called ambulance.  At baseline, active, able to walk 15-20 minutes without shortness of breath.   ED Course: Afebrile, blood pressure elevated, nonhypoxic.  CT right hip showed comminuted impacted and displaced intertrochanteric fracture of proximal right femur.  Other trauma scan including CT head and neck negative for acute findings.    Assessment and Plan:  Right femoral intertrochanteric fracture Secondary to mechanical fall.   Denied any symptoms of prodromes of syncope and near syncope. Status post ORIF of right hip (POD 1) Continue pain control Continue physical therapy    Alcohol abuse -Last drink was the night prior to admission, currently there is no symptoms signs of alcohol withdrawal. On CIWA protocol, administer Ativan for CIWA score of 8 or greater     Severe malnutrition related to chronic illness Related to chronic illness, alcohol abuse as evidenced by moderate to severe fat depletion and moderate to severe muscle depletion Liberalize diet to regular for widest variety of meal selections Ensure Enlive po TID, each supplement provides 350 kcal and 20 grams of protein.  Continue MVI with minerals daily Continue 1 mg folic acid daily Continue 100 mg thiamine  daily    Anemia Noted to have a drop in her H&H from 12.5 >> 8.1 ??  Related to recent surgery Repeat H&H Hold Lovenox     Cigarette smoking -Cessation education performed at bedside -Start nicotine patch    Hyponatremia Most likely secondary to poor oral intake and alcohol abuse Monitor sodium levels closely      Subjective: Complains of pain in her right hip  Physical Exam: Vitals:   12/20/22 1702 12/20/22 1748 12/21/22 0740 12/21/22 0742  BP: (!) 152/66 130/85 (!) 140/122 112/74  Pulse: 96 94 75 77  Resp: 16  14   Temp: (!) 97.4 F (36.3 C)  98.8 F (37.1 C)   TempSrc:   Oral   SpO2: 99% 97% 95% 96%  Weight:      Height:        Eyes: PERRL, lids and conjunctivae normal ENMT: Mucous membranes are moist. Posterior pharynx clear of any exudate or lesions.Normal dentition.  Neck: normal, supple, no masses, no thyromegaly Respiratory: clear to auscultation bilaterally, no wheezing, no crackles. Normal respiratory effort. No accessory muscle use.  Cardiovascular: Regular rate and rhythm, no murmurs / rubs / gallops. No extremity edema. 2+ pedal pulses. No carotid bruits.  Abdomen: no tenderness, no masses palpated. No hepatosplenomegaly. Bowel sounds positive.  Musculoskeletal: SCDs in place Skin: no rashes, lesions, ulcers. No induration Neurologic: CN 2-12 grossly intact. Sensation intact, DTR normal. Strength 5/5 in all 4.  Psychiatric: Normal judgment and insight. Alert and oriented x 3. Normal mood.  Data Reviewed: Labs reviewed.  Hemoglobin 8.1, sodium 131 There are no new results to review at this time.  Family Communication: Plan of care discussed with patient  Disposition: Status is: Inpatient Remains inpatient appropriate because: Awaiting placement  Planned Discharge Destination: Skilled nursing facility    Time spent: 35 minutes  Author: Lucile Shutters, MD 12/21/2022 12:47 PM  For on call review [email or web address].

## 2022-12-21 NOTE — Progress Notes (Signed)
Initial Nutrition Assessment  DOCUMENTATION CODES:   Severe malnutrition in context of chronic illness  INTERVENTION:   -Liberalize diet to regular for widest variety of meal selections -Ensure Enlive po TID, each supplement provides 350 kcal and 20 grams of protein.  -Continue MVI with minerals daily -Continue 1 mg folic acid daily -Continue 981 mg thiamine daily  NUTRITION DIAGNOSIS:   Severe Malnutrition related to chronic illness (ETOH abuse) as evidenced by moderate fat depletion, severe fat depletion, moderate muscle depletion, severe muscle depletion.  GOAL:   Patient will meet greater than or equal to 90% of their needs  MONITOR:   PO intake, Supplement acceptance  REASON FOR ASSESSMENT:   Consult Assessment of nutrition requirement/status  ASSESSMENT:   Pt with medical history significant of alcohol abuse, cigarette smoking, presented with mechanical fall and right hip pain.  Pt admitted with rt femoral intertochanteric fracture.   11/23- s/p Procedure(s) with comments: OPEN REDUCTION INTERNAL FIXATION HIP (Right) - intermediate TFN ( Synthes) with C- arm  and asssistant  Reviewed I/O's: -300 ml x 24 hours  UOP: 400 ml x 24 hours  Spoke with pt at bedside, who reports feeling poorly since surgery. She reports feeling "woozy" since working with therapies earlier today and intake has been low due to this. Per pt, she has a fair appetite at baseline and tries to consumes 2-3 meals per day. Meals comprise of foods such as soup or peanut butter sandwiches. Pt also shares that she drinks Boost at home, but is not consistently.   Pt reports that she does not have access to her dentures (pt has gotten new dentures that fit well since last hospitalization), however, her sister plan to bring them in later today. Pt refuses offer of diet downgrade stating she cannot "eat that ground up food". Pt did complain of being unable to access foods that she often eats on current  diet, such as crackers for her soup and cake.    Pt reports wt has been stable over the past year. Reviewed wt hx; no wt loss noted over the past 3 months.   Discussed importance of good meal and supplement intake to promote healing. Pt amenable to supplements and will liberalize diet for wider variety of meal selections. Informed pt to choose menu items that she likes and thinks she can chew until she is able to access her dentures.   Medications reviewed and include colace, lovenox, folic acid, and thiamine.   No results found for: "HGBA1C" PTA DM medications are none.   Labs reviewed: Na: 131, CBGS: 210 (inpatient orders for glycemic control are none).    NUTRITION - FOCUSED PHYSICAL EXAM:  Flowsheet Row Most Recent Value  Orbital Region Moderate depletion  Upper Arm Region Severe depletion  Thoracic and Lumbar Region Moderate depletion  Buccal Region Severe depletion  Temple Region Severe depletion  Clavicle Bone Region Severe depletion  Clavicle and Acromion Bone Region Severe depletion  Scapular Bone Region Severe depletion  Dorsal Hand Moderate depletion  Patellar Region Moderate depletion  Anterior Thigh Region Moderate depletion  Posterior Calf Region Moderate depletion  Edema (RD Assessment) None  Hair Reviewed  Eyes Reviewed  Mouth Reviewed  Skin Reviewed  Nails Reviewed       Diet Order:   Diet Order             Diet regular Room service appropriate? Yes; Fluid consistency: Thin  Diet effective now  EDUCATION NEEDS:   Education needs have been addressed  Skin:  Skin Assessment: Skin Integrity Issues: Skin Integrity Issues:: Incisions Incisions: closed rt hip  Last BM:  12/21/22 (type 6)  Height:   Ht Readings from Last 1 Encounters:  12/20/22 5\' 3"  (1.6 m)    Weight:   Wt Readings from Last 1 Encounters:  12/20/22 49.9 kg    Ideal Body Weight:  52.3 kg  BMI:  Body mass index is 19.49 kg/m.  Estimated Nutritional  Needs:   Kcal:  1550-1750  Protein:  85-100 grams  Fluid:  > 1.5 L    Levada Schilling, RD, LDN, CDCES Registered Dietitian III Certified Diabetes Care and Education Specialist Please refer to [facility] for RD and/or RD on-call/weekend/after hours pager

## 2022-12-21 NOTE — NC FL2 (Signed)
[place] MEDICAID FL2 LEVEL OF CARE FORM     IDENTIFICATION  Patient Name: Jill Hayes Birthdate: 09/02/1950 Sex: female Admission Date (Current Location): 12/20/2022  [facility] and [place] Number:  Chiropodist and Address:  [facility], [address] [place], [place] [address]      Provider Number: [id number]  Attending Physician Name and Address:  Lucile Shutters, MD  Relative Name and Phone Number:  Herbert Deaner [phone]    Current Level of Care: Hospital Recommended Level of Care: Skilled Nursing Facility Prior Approval Number:    Date Approved/Denied:   PASRR Number: [id number]  Discharge Plan: SNF    Current Diagnoses: Patient Active Problem List   Diagnosis Date Noted   Closed right hip fracture (HCC) 12/20/2022   Hip fracture (HCC) 12/20/2022   Encounter to establish care 05/04/2022   History of smoking greater than 50 pack years 05/04/2022   Encounter for screening for HIV 05/04/2022   Need for hepatitis C screening test 05/04/2022   Easy bruising 05/04/2022   Tobacco use 05/04/2022   Alcohol-induced insomnia (HCC) 05/04/2022   Major depressive disorder, recurrent severe without psychotic features (HCC) 01/01/2021   Severe protein-calorie malnutrition (HCC) 12/29/2020   Anxiety and depression 12/29/2020   Alcohol abuse 12/28/2020   Chronic cerebrovascular accident (CVA) 12/28/2020    Orientation RESPIRATION BLADDER Height & Weight     Self, Time, Situation, Place  Normal Continent, External catheter Weight: 49.9 kg Height:  5\' 3"  (160 cm)  BEHAVIORAL SYMPTOMS/MOOD NEUROLOGICAL BOWEL NUTRITION STATUS      Continent Diet (see DC summary)  AMBULATORY STATUS COMMUNICATION OF NEEDS Skin   Extensive Assist Verbally Normal, Surgical wounds                       Personal Care Assistance Level of Assistance  Bathing, Feeding, Dressing Bathing Assistance: Limited assistance Feeding assistance: Limited  assistance Dressing Assistance: Maximum assistance     Functional Limitations Info  Sight, Hearing, Speech Sight Info: Adequate Hearing Info: Adequate Speech Info: Adequate    SPECIAL CARE FACTORS FREQUENCY  PT (By licensed PT), OT (By licensed OT)     PT Frequency: 5 times per week OT Frequency: 5 times per week            Contractures Contractures Info: Not present    Additional Factors Info  Code Status, Allergies Code Status Info: Full code Allergies Info: Codeine           Current Medications (12/21/2022):  This is the current hospital active medication list Current Facility-Administered Medications  Medication Dose Route Frequency Provider Last Rate Last Admin   acetaminophen (TYLENOL) tablet 325-650 mg  325-650 mg Oral Q6H PRN Horton, Windy Canny, MD       acetaminophen (TYLENOL) tablet 500 mg  500 mg Oral Q6H Horton, Rinelda M, MD   500 mg at 12/21/22 1341   docusate sodium (COLACE) capsule 100 mg  100 mg Oral BID Horton, Windy Canny, MD   100 mg at 12/21/22 0933   feeding supplement (ENSURE ENLIVE / ENSURE PLUS) liquid 237 mL  237 mL Oral TID BM Agbata, Tochukwu, MD   237 mL at 12/21/22 1341   folic acid (FOLVITE) tablet 1 mg  1 mg Oral Daily Horton, Rinelda M, MD   1 mg at 12/21/22 0933   hydrALAZINE (APRESOLINE) injection 5 mg  5 mg Intravenous Q6H PRN Horton, Windy Canny, MD  HYDROcodone-acetaminophen (NORCO/VICODIN) 5-325 MG per tablet 1-2 tablet  1-2 tablet Oral Q6H PRN Horton, Windy Canny, MD   2 tablet at 12/21/22 0932   HYDROmorphone (DILAUDID) injection 0.5 mg  0.5 mg Intravenous Q2H PRN Horton, Windy Canny, MD   0.5 mg at 12/21/22 1358   LORazepam (ATIVAN) tablet 1-4 mg  1-4 mg Oral Q1H PRN Horton, Windy Canny, MD       Or   LORazepam (ATIVAN) injection 1-4 mg  1-4 mg Intravenous Q1H PRN Horton, Windy Canny, MD       metoCLOPramide (REGLAN) tablet 5-10 mg  5-10 mg Oral Q8H PRN Horton, Windy Canny, MD       Or   metoCLOPramide (REGLAN) injection 5-10 mg  5-10 mg  Intravenous Q8H PRN Horton, Rinelda M, MD   5 mg at 12/20/22 1903   metoprolol tartrate (LOPRESSOR) tablet 12.5 mg  12.5 mg Oral BID Horton, Windy Canny, MD   12.5 mg at 12/21/22 8756   multivitamin with minerals tablet 1 tablet  1 tablet Oral Daily Horton, Windy Canny, MD   1 tablet at 12/21/22 4332   nicotine (NICODERM CQ - dosed in mg/24 hr) patch 7 mg  7 mg Transdermal Daily Horton, Rinelda M, MD   7 mg at 12/21/22 0933   ondansetron (ZOFRAN) tablet 4 mg  4 mg Oral Q6H PRN Horton, Windy Canny, MD   4 mg at 12/21/22 9518   Or   ondansetron (ZOFRAN) injection 4 mg  4 mg Intravenous Q6H PRN Horton, Windy Canny, MD   4 mg at 12/20/22 1718   thiamine (VITAMIN B1) tablet 100 mg  100 mg Oral Daily Horton, Windy Canny, MD   100 mg at 12/21/22 8416   Or   thiamine (VITAMIN B1) injection 100 mg  100 mg Intravenous Daily Horton, Windy Canny, MD         Discharge Medications: Please see discharge summary for a list of discharge medications.  Relevant Imaging Results:  Relevant Lab Results:   Additional Information SS# [id number]  Marlowe Sax, RN

## 2022-12-21 NOTE — Plan of Care (Signed)
Progressing towards set goals.

## 2022-12-21 NOTE — Progress Notes (Addendum)
  Subjective: 1 Day Post-Op Procedure(s) (LRB): OPEN REDUCTION INTERNAL FIXATION HIP (Right) Patient reports pain as moderate.   Patient is well, and has had no acute complaints or problems PT and care management to assist with discharge. She may need SNF following discharge. Negative for chest pain and shortness of breath Fever: no Gastrointestinal:Negative for nausea and vomiting  Objective: Vital signs in last 24 hours: Temp:  [97.3 F (36.3 C)-98.8 F (37.1 C)] 98.8 F (37.1 C) (11/22 0740) Pulse Rate:  [75-96] 77 (11/22 0742) Resp:  [14-21] 14 (11/22 0740) BP: (112-162)/(62-122) 112/74 (11/22 0742) SpO2:  [95 %-100 %] 96 % (11/22 0742) Weight:  [49.9 kg] 49.9 kg (11/21 1302)  Intake/Output from previous day:  Intake/Output Summary (Last 24 hours) at 12/21/2022 0805 Last data filed at 12/21/2022 0346 Gross per 24 hour  Intake 350 ml  Output 650 ml  Net -300 ml    Intake/Output this shift: No intake/output data recorded.  Labs: Recent Labs    12/20/22 0800 12/21/22 0339  HGB 12.5 8.1*   Recent Labs    12/20/22 0800 12/21/22 0339  WBC 7.7 7.8  RBC 3.81* 2.46*  HCT 35.8* 23.2*  PLT 164 159   Recent Labs    12/20/22 0800 12/21/22 0339  NA 132* 131*  K 3.6 4.7  CL 94* 94*  CO2 21* 24  BUN 11 15  CREATININE 0.76 0.97  GLUCOSE 115* 186*  CALCIUM 8.6* 7.8*   Recent Labs    12/20/22 0800  INR 1.1     EXAM General - Patient is Alert, Appropriate, and Oriented Extremity - ABD soft Neurovascular intact Dorsiflexion/Plantar flexion intact Incision: moderate drainage No cellulitis present Compartment soft Dressing/Incision - Moderate bloody drainage noted tot he proximal incision.  Minimal to the distal incision. Motor Function - intact, moving foot and toes well on exam.  Abdomen soft with intact bowel sounds this morning.  Past Medical History:  Diagnosis Date   Cataract    Depression     Assessment/Plan: 1 Day Post-Op Procedure(s)  (LRB): OPEN REDUCTION INTERNAL FIXATION HIP (Right) Principal Problem:   Hip fracture (HCC) Active Problems:   Closed right hip fracture (HCC)  Estimated body mass index is 19.49 kg/m as calculated from the following:   Height as of this encounter: 5\' 3"  (1.6 m).   Weight as of this encounter: 49.9 kg. Advance diet Up with therapy D/C IV fluids when tolerating po intake.  Labs reviewed this AM, Hg 8.1 this morning. WBC 7.8. Up with therapy today.  PT and care management to assist with discharge planning. Appears patient has had multiple BM at this time.  DVT Prophylaxis - Lovenox and TED hose Weight-Bearing as tolerated to right leg  J. Horris Latino, PA-C [facility] 12/21/2022, 8:05 AM    Addendum: I agree with the clinical findings and proposed orthopedic plan for this unfortunate woman's right hip injury and subsequent surgery.  She will need to have her H&H observed closely to be sure that she may not need a transfusion.  In addition, her right hip wound will need to be redressed/reinforced as necessary due to her drainage.  She may require the placement of a Prevena vacuum dressing.  Maryagnes Amos, MD [facility] Orthopa[facility] Surgery 12/21/2022, [phone]

## 2022-12-22 DIAGNOSIS — S72001A Fracture of unspecified part of neck of right femur, initial encounter for closed fracture: Secondary | ICD-10-CM | POA: Diagnosis not present

## 2022-12-22 LAB — BASIC METABOLIC PANEL
Anion gap: 11 (ref 5–15)
BUN: 15 mg/dL (ref 8–23)
CO2: 25 mmol/L (ref 22–32)
Calcium: 7.9 mg/dL — ABNORMAL LOW (ref 8.9–10.3)
Chloride: 96 mmol/L — ABNORMAL LOW (ref 98–111)
Creatinine, Ser: 0.81 mg/dL (ref 0.44–1.00)
GFR, Estimated: >60 mL/min (ref >60)
Glucose, Bld: 110 mg/dL — ABNORMAL HIGH (ref 70–99)
Potassium: 4 mmol/L (ref 3.5–5.1)
Sodium: 132 mmol/L — ABNORMAL LOW (ref 135–145)

## 2022-12-22 LAB — CBC
HCT: 20.2 % — ABNORMAL LOW (ref 36.0–46.0)
Hemoglobin: 7.1 g/dL — ABNORMAL LOW (ref 12.0–15.0)
MCH: 33.5 pg (ref 26.0–34.0)
MCHC: 35.1 g/dL (ref 30.0–36.0)
MCV: 95.3 fL (ref 80.0–100.0)
Platelets: 111 10*3/uL — ABNORMAL LOW (ref 150–400)
RBC: 2.12 MIL/uL — ABNORMAL LOW (ref 3.87–5.11)
RDW: 13 % (ref 11.5–15.5)
WBC: 5.6 10*3/uL (ref 4.0–10.5)
nRBC: 0 % (ref 0.0–0.2)

## 2022-12-22 LAB — HEMOGLOBIN AND HEMATOCRIT, BLOOD
HCT: 24.6 % — ABNORMAL LOW (ref 36.0–46.0)
Hemoglobin: 8.3 g/dL — ABNORMAL LOW (ref 12.0–15.0)

## 2022-12-22 LAB — MAGNESIUM: Magnesium: 1.7 mg/dL (ref 1.7–2.4)

## 2022-12-22 LAB — PREPARE RBC (CROSSMATCH)

## 2022-12-22 MED ORDER — SODIUM CHLORIDE 0.9% IV SOLUTION: Freq: Once | INTRAVENOUS | Status: DC

## 2022-12-22 MED ORDER — SODIUM CHLORIDE 0.9% IV SOLUTION: Freq: Once | INTRAVENOUS | Status: AC

## 2022-12-22 MED ORDER — NICOTINE 21 MG/24HR TD PT24
21.0000 mg | MEDICATED_PATCH | Freq: Every day | TRANSDERMAL | Status: DC
  Administered 2022-12-23 – 2022-12-28 (×6): 21 mg via TRANSDERMAL
  Filled 2022-12-22 (×6): qty 1

## 2022-12-22 MED ORDER — NICOTINE 21 MG/24HR TD PT24: 21.0000 mg | MEDICATED_PATCH | Freq: Every day | TRANSDERMAL | Status: DC

## 2022-12-22 NOTE — Plan of Care (Signed)
Problem: Education: Goal: Knowledge of General Education information will improve Description: Including pain rating scale, medication(s)/side effects and non-pharmacologic comfort measures Outcome: Not Progressing   Problem: Health Behavior/Discharge Planning: Goal: Ability to manage health-related needs will improve Outcome: Not Progressing   Problem: Activity: Goal: Risk for activity intolerance will decrease Outcome: Not Progressing   Problem: Coping: Goal: Level of anxiety will decrease Outcome: Not Progressing   Problem: Pain Management: Goal: General experience of comfort will improve Outcome: Not Progressing

## 2022-12-22 NOTE — Progress Notes (Signed)
Physical Therapy Treatment Patient Details Name: Jill Hayes MRN: [id number] DOB: [birth year] Today's Date: 12/22/2022   History of Present Illness Pt is a 72 y.o. female presented with mechanical fall and R hip pain, now s/p R hip ORIF. PMH includes alcohol abuse, cigarette smoking, anxiety, depression, and cataracts.    PT Comments  Pt was long sitting in bed upon arrival. She did know she was in hospital for hip but later in session was talking to people not in room. Per chart review, CIWA. Pt was agreeable to session and OOB activity. She was able to exit bed, stand to RW and ambulate short distances. Pt is impulsive and endorses severe pain throughout all wt bearing. Pt needed constant vcs for safety and to keep UE support on RW. Pt c/o dizziness once sitting in recliner. BP  93/51. PA made aware. Hgb 7.1 but unit of blood ordered post session. Pt is progressing but slowly due to pain and impulsivity. DC recs remain appropriate. Acute PT will continue to follow and progress per current POC.    If plan is discharge home, recommend the following: A lot of help with walking and/or transfers;A lot of help with bathing/dressing/bathroom;Assistance with cooking/housework;Direct supervision/assist for medications management;Direct supervision/assist for financial management;Assist for transportation;Help with stairs or ramp for entrance;Supervision due to cognitive status     Equipment Recommendations  Other (comment) (Defer to next level of care)       Precautions / Restrictions Precautions Precautions: Fall Restrictions Weight Bearing Restrictions: Yes RLE Weight Bearing: Weight bearing as tolerated     Mobility  Bed Mobility Overal bed mobility: Needs Assistance Bed Mobility: Supine to Sit, Sit to Supine  Supine to sit: Min assist, HOB elevated, Used rails Sit to supine: Max assist General bed mobility comments: increased time due to pain    Transfers Overall transfer level: Needs  assistance Equipment used: Rolling walker (2 wheels) Transfers: Sit to/from Stand Sit to Stand: Mod assist, From elevated surface Stand pivot transfers: Mod assist  General transfer comment: Mod assist to stand from EOB and recliner. max vcs for technique. pt is impulsive and required constant vcs for sequencing and safety    Ambulation/Gait Ambulation/Gait assistance: Min assist Gait Distance (Feet): 5 Feet Assistive device: Rolling walker (2 wheels) Gait Pattern/deviations: Step-to pattern, Antalgic, Trunk flexed, Narrow base of support Gait velocity: decreased  General Gait Details: pt ambulated to recliner from EOB however due to pt's impulsivity, anxiety, low BP concerns.Marland Kitchenauthor elected to return pt to bed. PA order blood post session.     Balance Overall balance assessment: Needs assistance Sitting-balance support: No upper extremity supported, Feet supported Sitting balance-Leahy Scale: Good     Standing balance support: Bilateral upper extremity supported, Reliant on assistive device for balance Standing balance-Leahy Scale: Poor Standing balance comment: pt is high fall risk due to cognition, impulsivity and low BP concerns       Cognition Arousal: Alert Behavior During Therapy: WFL for tasks assessed/performed, Anxious, Impulsive Overall Cognitive Status: Within Functional Limits for tasks assessed      General Comments: pt's cognition was inconsistent throughout. CIWA. Pt was aware she was in hospital for hip but later in session was talking to people not in room. PA/RN made aware               Pertinent Vitals/Pain Pain Assessment Pain Assessment: PAINAD Breathing: occasional labored breathing, short period of hyperventilation Negative Vocalization: occasional moan/groan, low speech, negative/disapproving quality Facial Expression: smiling or inexpressive Body Language:  relaxed Consolability: distracted or reassured by voice/touch PAINAD Score: 3 Pain  Location: R hip Pain Descriptors / Indicators: Aching, Discomfort, Grimacing, Guarding, Sore Pain Intervention(s): Limited activity within patient's tolerance, Monitored during session, Premedicated before session, Repositioned     PT Goals (current goals can now be found in the care plan section) Acute Rehab PT Goals Patient Stated Goal: none stated Progress towards PT goals: Progressing toward goals    Frequency    BID       AM-PAC PT "6 Clicks" Mobility   Outcome Measure  Help needed turning from your back to your side while in a flat bed without using bedrails?: A Little Help needed moving from lying on your back to sitting on the side of a flat bed without using bedrails?: A Lot Help needed moving to and from a bed to a chair (including a wheelchair)?: A Lot Help needed standing up from a chair using your arms (e.g., wheelchair or bedside chair)?: A Little Help needed to walk in hospital room?: A Lot Help needed climbing 3-5 steps with a railing? : A Lot 6 Click Score: 14    End of Session Equipment Utilized During Treatment: Gait belt Activity Tolerance: Patient limited by pain;Treatment limited secondary to medical complications (Comment) (limited by low BP concerns) Patient left: in bed;with call bell/phone within reach;with nursing/sitter in room;with family/visitor present Nurse Communication: Mobility status;Other (comment) PT Visit Diagnosis: Muscle weakness (generalized) (M62.81);Other abnormalities of gait and mobility (R26.89);Pain Pain - Right/Left: Right Pain - part of body: Hip     Time: 0822-0838 PT Time Calculation (min) (ACUTE ONLY): 16 min  Charges:    $Therapeutic Activity: 8-22 mins PT General Charges $$ ACUTE PT VISIT: 1 Visit                     Jetta Lout PTA 12/22/22, 9:19 AM

## 2022-12-22 NOTE — Progress Notes (Signed)
PT Cancellation Note  Patient Details Name: Jill Hayes MRN: [id number] DOB: Oct 07, 1950   Cancelled Treatment:     PT attempt. Currently receiving blood transfusion.  Pt's son at bedside and endorses pt having frequently falls at home recently and wondering if anemia/low BP has been causing these falls. Pt presents much more confused this afternoon versus earlier this date. Author questions if due to alcohol withdrawals. Tele-sitter now present. Will hold PM PT session but will continue to follow per current POC.     Rushie Chestnut 12/22/2022, 1:15 PM

## 2022-12-22 NOTE — Progress Notes (Signed)
  Subjective: 2 Days Post-Op Procedure(s) (LRB): OPEN REDUCTION INTERNAL FIXATION HIP (Right) Patient reports pain as mild in the right hip this morning..   Patient is well, and has had no acute complaints or problems Current plan is for SNF following discharge. Prevena woundvac was applied to the proximal hip incision yesterday evening.  Mild bloody drainage noted. Negative for chest pain and shortness of breath Fever: no Gastrointestinal:Negative for nausea and vomiting  Objective: Vital signs in last 24 hours: Temp:  [97.8 F (36.6 C)-98.8 F (37.1 C)] 98 F (36.7 C) (11/23 0413) Pulse Rate:  [69-77] 77 (11/23 0413) Resp:  [14-16] 14 (11/23 0413) BP: (112-140)/(60-122) 117/80 (11/23 0413) SpO2:  [95 %-100 %] 96 % (11/23 0413)  Intake/Output from previous day: No intake or output data in the 24 hours ending 12/22/22 0725   Intake/Output this shift: No intake/output data recorded.  Labs: Recent Labs    12/20/22 0800 12/21/22 0339 12/22/22 0457  HGB 12.5 8.1* 7.1*   Recent Labs    12/21/22 0339 12/22/22 0457  WBC 7.8 5.6  RBC 2.46* 2.12*  HCT 23.2* 20.2*  PLT 159 111*   Recent Labs    12/20/22 0800 12/21/22 0339  NA 132* 131*  K 3.6 4.7  CL 94* 94*  CO2 21* 24  BUN 11 15  CREATININE 0.76 0.97  GLUCOSE 115* 186*  CALCIUM 8.6* 7.8*   Recent Labs    12/20/22 0800  INR 1.1     EXAM General - Patient is Alert, Appropriate, and Oriented Extremity - ABD soft Neurovascular intact Dorsiflexion/Plantar flexion intact No cellulitis present Compartment soft Dressing/Incision - Minimal bloody drainage to the distal incision.  Prevena intact to the proximal incision site.  Mild bloody drainage noted. Motor Function - intact, moving foot and toes well on exam.  Abdomen soft with intact bowel sounds this morning. Thigh swelling has improved.  Past Medical History:  Diagnosis Date   Cataract    Depression     Assessment/Plan: 2 Days Post-Op  Procedure(s) (LRB): OPEN REDUCTION INTERNAL FIXATION HIP (Right) Principal Problem:   Hip fracture (HCC) Active Problems:   Closed right hip fracture (HCC)  Estimated body mass index is 19.49 kg/m as calculated from the following:   Height as of this encounter: 5\' 3"  (1.6 m).   Weight as of this encounter: 49.9 kg. Advance diet Up with therapy D/C IV fluids when tolerating po intake.  Labs reviewed this AM.  BP 117/80. HR 77. WBC 5.6, Hg 7.1 this morning.  She denies any dizziness or SOB.  Will monitor her during PT, if symptomatic we discussed a transfusion.  Re-check Hg in the AM. Up with therapy today.  Current plan is for d/c to SNF. She is urinating well without issues.  DVT Prophylaxis - Lovenox and TED hose Weight-Bearing as tolerated to right leg  J. Horris Latino, PA-C [facility] 12/22/2022, 7:25 AM

## 2022-12-22 NOTE — Progress Notes (Signed)
Progress Note   Patient: Jill Hayes:[id number] DOB: [birth year] DOA: 12/20/2022     2 DOS: the patient was seen and examined on 12/22/2022   Brief hospital course:  Jill Hayes is a 72 y.o. female with medical history significant of alcohol abuse, cigarette smoking, presented with mechanical fall and right hip pain.   Patient woke up this morning and going to kitchen but " legs gave up" and fell on the right side and hit right hip.  Denies any chest pain shortness of breath lightheadedness or any other prodromes and no LOC, no head injury.  She was able to sit up and then stand up on her own right after the fall and walk back to bathroom however started to feel extruding pain of the right hip and called ambulance.  At baseline, active, able to walk 15-20 minutes without shortness of breath.   ED Course: Afebrile, blood pressure elevated, nonhypoxic.  CT right hip showed comminuted impacted and displaced intertrochanteric fracture of proximal right femur.  Other trauma scan including CT head and neck negative for acute findings.       Assessment and Plan:  History of alcohol abuse Alcohol withdrawal symptoms Patient seen in rounds and noted to be confused Oriented only to person not to place and time Continue Ativan per CIWA protocol Continue MVI/thiamine/folic acid Will order telemetry sitter as patient is at high risk for falls   Right femoral intertrochanteric fracture Secondary to mechanical fall.   Denied any symptoms of prodromes of syncope and near syncope. Status post ORIF of right hip (POD 2) Continue pain control Continue physical therapy        Severe malnutrition related to chronic illness Related to chronic illness, alcohol abuse as evidenced by moderate to severe fat depletion and moderate to severe muscle depletion Liberalize diet to regular for widest variety of meal selections Ensure Enlive po TID, each supplement provides 350 kcal and 20 grams of  protein.  Continue MVI with minerals daily Continue 1 mg folic acid daily Continue 100 mg thiamine daily       Anemia Noted to have a drop in her H&H from 12.5 >> 7.1 ??  Related to recent surgery Hold Lovenox Transfuse 1 unit of packed RBC     Cigarette smoking -Cessation education performed at bedside -Continue nicotine patch at 21 mg daily       Hyponatremia Most likely secondary to poor oral intake and alcohol abuse Monitor sodium levels closely          Subjective: Confused.  Attempting to slide out of bed  Physical Exam: Vitals:   12/22/22 0413 12/22/22 0811 12/22/22 0955 12/22/22 1038  BP: 117/80 (!) 106/90 (!) 96/55 (!) 92/53  Pulse: 77 80 83 76  Resp: 14 17 17 20   Temp: 98 F (36.7 C) 98 F (36.7 C)  98.2 F (36.8 C)  TempSrc: Oral   Oral  SpO2: 96% 98% 100% 100%  Weight:      Height:       Eyes: PERRL, lids and conjunctivae normal ENMT: Mucous membranes are moist. Posterior pharynx clear of any exudate or lesions.Normal dentition.  Neck: normal, supple, no masses, no thyromegaly Respiratory: clear to auscultation bilaterally, no wheezing, no crackles. Normal respiratory effort. No accessory muscle use.  Cardiovascular: Regular rate and rhythm, no murmurs / rubs / gallops. No extremity edema. 2+ pedal pulses. No carotid bruits.  Abdomen: no tenderness, no masses palpated. No hepatosplenomegaly. Bowel sounds positive.  Musculoskeletal:  SCDs in place, decreased range of motion right hip Skin: no rashes, lesions, ulcers. No induration Neurologic: Unable to assess Psychiatric: Confused.  Oriented only to person not to place or time       Data Reviewed: Labs reviewed.  Hemoglobin 7.1 There are no new results to review at this time.  Family Communication: Called and spoke to patient's sister over the phone and updated her on patient's current condition.  All questions and concerns have been addressed.  She verbalizes understanding and agrees to the  plan.  Disposition: Status is: Inpatient Remains inpatient appropriate because: Alcohol withdrawal symptoms  Planned Discharge Destination: Skilled nursing facility    Time spent: 35 minutes  Author: Lucile Shutters, MD 12/22/2022 12:20 PM  For on call review [email or web address].

## 2022-12-23 DIAGNOSIS — S72001A Fracture of unspecified part of neck of right femur, initial encounter for closed fracture: Secondary | ICD-10-CM | POA: Diagnosis not present

## 2022-12-23 LAB — VITAMIN B12: Vitamin B-12: 518 pg/mL (ref 180–914)

## 2022-12-23 LAB — CBC
HCT: 25.2 % — ABNORMAL LOW (ref 36.0–46.0)
Hemoglobin: 8.5 g/dL — ABNORMAL LOW (ref 12.0–15.0)
MCH: 31 pg (ref 26.0–34.0)
MCHC: 33.7 g/dL (ref 30.0–36.0)
MCV: 92 fL (ref 80.0–100.0)
Platelets: 130 10*3/uL — ABNORMAL LOW (ref 150–400)
RBC: 2.74 MIL/uL — ABNORMAL LOW (ref 3.87–5.11)
RDW: 17.8 % — ABNORMAL HIGH (ref 11.5–15.5)
WBC: 4.1 10*3/uL (ref 4.0–10.5)
nRBC: 0 % (ref 0.0–0.2)

## 2022-12-23 MED ORDER — ENOXAPARIN SODIUM 40 MG/0.4ML IJ SOSY
40.0000 mg | PREFILLED_SYRINGE | INTRAMUSCULAR | Status: DC
  Administered 2022-12-24 – 2022-12-28 (×5): 40 mg via SUBCUTANEOUS
  Filled 2022-12-23 (×6): qty 0.4

## 2022-12-23 MED ORDER — LORAZEPAM 2 MG/ML IJ SOLN: 1.0000 mg | INTRAMUSCULAR | Status: AC | PRN

## 2022-12-23 MED ORDER — POLYETHYLENE GLYCOL 3350 17 G PO PACK
17.0000 g | PACK | Freq: Every day | ORAL | Status: DC
Start: 2022-12-23 — End: 2022-12-28
  Administered 2022-12-24 – 2022-12-28 (×5): 17 g via ORAL
  Filled 2022-12-23 (×6): qty 1

## 2022-12-23 MED ORDER — LORAZEPAM 1 MG PO TABS
1.0000 mg | ORAL_TABLET | ORAL | Status: AC | PRN
Start: 2022-12-23 — End: 2022-12-26
  Administered 2022-12-23: 1 mg via ORAL
  Filled 2022-12-23: qty 1

## 2022-12-23 NOTE — Progress Notes (Signed)
Physical Therapy Treatment Patient Details Name: Jill Hayes MRN: [id number] DOB: [birth year] Today's Date: 12/23/2022   History of Present Illness Pt is a 72 y.o. female presented with mechanical fall and R hip pain, now s/p R hip ORIF. PMH includes alcohol abuse, cigarette smoking, anxiety, depression, and cataracts.    PT Comments  Pt in bed, had spilled her coffee over her needing new gown and bedding.  She is able to get to EOB with min a x 1.  New gown donned.  Stood with mod a x 1 and is able to take several poor quality steps to recliner with RW and mod a x 1.  R LE buckling some with WB.  BP upon sitting in chair with legs elevated 99/61 P 72.  She is a bit lethargic at times but chair alarm on,. Mat in place, telesitter active and RN aware.  Confusion remains.   If plan is discharge home, recommend the following: A lot of help with walking and/or transfers;A lot of help with bathing/dressing/bathroom;Assistance with cooking/housework;Direct supervision/assist for medications management;Direct supervision/assist for financial management;Assist for transportation;Help with stairs or ramp for entrance;Supervision due to cognitive status   Can travel by private vehicle        Equipment Recommendations  Other (comment) (Defer to next level of care)    Recommendations for Other Services       Precautions / Restrictions Precautions Precautions: Fall Restrictions Weight Bearing Restrictions: Yes RLE Weight Bearing: Weight bearing as tolerated     Mobility  Bed Mobility Overal bed mobility: Needs Assistance Bed Mobility: Supine to Sit     Supine to sit: Min assist, HOB elevated, Used rails          Transfers Overall transfer level: Needs assistance Equipment used: Rolling walker (2 wheels) Transfers: Sit to/from Stand Sit to Stand: Mod assist, From elevated surface   Step pivot transfers: Mod assist            Ambulation/Gait Ambulation/Gait assistance: Mod  assist, Min assist Gait Distance (Feet): 3 Feet Assistive device: Rolling walker (2 wheels)   Gait velocity: decreased     General Gait Details: poor quality steps to recliner   Stairs             Wheelchair Mobility     Tilt Bed    Modified Rankin (Stroke Patients Only)       Balance Overall balance assessment: Needs assistance Sitting-balance support: No upper extremity supported, Feet supported Sitting balance-Leahy Scale: Fair     Standing balance support: Bilateral upper extremity supported, Reliant on assistive device for balance Standing balance-Leahy Scale: Poor Standing balance comment: pt is high fall risk due to cognition, impulsivity and low BP concerns                            Cognition Arousal: Alert Behavior During Therapy: WFL for tasks assessed/performed, Lability (began lightly weeping.  stated I was being kind.) Overall Cognitive Status: Within Functional Limits for tasks assessed                                          Exercises Other Exercises Other Exercises: seated AROM    General Comments        Pertinent Vitals/Pain Pain Assessment Pain Assessment: Faces Faces Pain Scale: Hurts little more Pain Location: R hip Pain  Descriptors / Indicators: Aching, Discomfort, Grimacing, Guarding, Sore Pain Intervention(s): Limited activity within patient's tolerance, Monitored during session, Repositioned    Home Living                          Prior Function            PT Goals (current goals can now be found in the care plan section) Progress towards PT goals: Progressing toward goals    Frequency    BID      PT Plan      Co-evaluation              AM-PAC PT "6 Clicks" Mobility   Outcome Measure  Help needed turning from your back to your side while in a flat bed without using bedrails?: A Little Help needed moving from lying on your back to sitting on the side of a flat  bed without using bedrails?: A Lot Help needed moving to and from a bed to a chair (including a wheelchair)?: A Lot Help needed standing up from a chair using your arms (e.g., wheelchair or bedside chair)?: A Lot Help needed to walk in hospital room?: A Lot Help needed climbing 3-5 steps with a railing? : Total 6 Click Score: 12    End of Session Equipment Utilized During Treatment: Gait belt Activity Tolerance: Patient limited by pain;Treatment limited secondary to medical complications (Comment) (limited by low BP concerns) Patient left: with call bell/phone within reach;in chair;with chair alarm set Nurse Communication: Mobility status;Other (comment) PT Visit Diagnosis: Muscle weakness (generalized) (M62.81);Other abnormalities of gait and mobility (R26.89);Pain Pain - Right/Left: Right Pain - part of body: Hip     Time: 1610-9604 PT Time Calculation (min) (ACUTE ONLY): 19 min  Charges:    $Therapeutic Activity: 8-22 mins PT General Charges $$ ACUTE PT VISIT: 1 Visit                     Danielle Dess, PTA 12/23/22, 1:31 PM

## 2022-12-23 NOTE — TOC Progression Note (Addendum)
Transition of Care [facility]) - Progression Note    Patient Details  Name: Jill Hayes MRN: [id number] Date of Birth: 1950/03/03  [facility] CM/SW Contact  Liliana Cline, LCSW Phone Number: 12/23/2022, 9:36 AM  Clinical Narrative:    Patient on isolation. Attempted call into room to discuss bed offer at [facility]. No answer. Will reattempt later.  12:34- Attempted another call into patient's room regarding bed offer. No answer. Asked RN to ask patient to call CSW back.  1:34- Notified by PTA that patient is lethargic and confused today. Requested TOC follow up tomorrow regarding bed offer.   Expected Discharge Plan: Skilled Nursing Facility Barriers to Discharge: SNF Pending bed offer, Insurance Authorization  Expected Discharge Plan and Services   Discharge Planning Services: CM Consult   Living arrangements for the past 2 months: Single Family Home                   DME Agency: NA       HH Arranged: NA           Social Determinants of Health (SDOH) Interventions SDOH Screenings   Food Insecurity: No Food Insecurity (12/20/2022)  Housing: Low Risk  (12/20/2022)  Transportation Needs: No Transportation Needs (12/20/2022)  Utilities: Not At Risk (12/20/2022)  Alcohol Screen: Low Risk  (09/18/2022)  Depression (PHQ2-9): Low Risk  (09/18/2022)  Financial Resource Strain: Low Risk  (09/18/2022)  Physical Activity: Inactive (09/18/2022)  Social Connections: Socially Isolated (09/18/2022)  Stress: No Stress Concern Present (09/18/2022)  Tobacco Use: High Risk (12/20/2022)  Health Literacy: Adequate Health Literacy (09/18/2022)    Readmission Risk Interventions     No data to display

## 2022-12-23 NOTE — Progress Notes (Addendum)
Subjective: 3 Days Post-Op Procedure(s) (LRB): OPEN REDUCTION INTERNAL FIXATION HIP (Right) Patient reports pain as mild in the right hip this morning. Patient is well but more confusion compared to when I saw her yesterday morning. She is alert to herself, remembers me from the other day but reports others in the room when just [place]. Current plan is for SNF following discharge. Prevena woundvac was applied to the proximal hip incision.  Mild bloody drainage noted. Negative for chest pain and shortness of breath Fever: no Gastrointestinal:Negative for nausea and vomiting  Objective: Vital signs in last 24 hours: Temp:  [97.7 F (36.5 C)-99.1 F (37.3 C)] 99.1 F (37.3 C) (11/23 2315) Pulse Rate:  [71-83] 74 (11/24 0046) Resp:  [16-20] 18 (11/23 2315) BP: (92-134)/(53-90) 116/65 (11/24 0046) SpO2:  [98 %-100 %] 100 % (11/23 2315)  Intake/Output from previous day:  Intake/Output Summary (Last 24 hours) at 12/23/2022 0719 Last data filed at 12/22/2022 1403 Gross per 24 hour  Intake 434 ml  Output --  Net 434 ml     Intake/Output this shift: No intake/output data recorded.  Labs: Recent Labs    12/20/22 0800 12/21/22 0339 12/22/22 0457 12/22/22 1507  HGB 12.5 8.1* 7.1* 8.3*   Recent Labs    12/21/22 0339 12/22/22 0457 12/22/22 1507  WBC 7.8 5.6  --   RBC 2.46* 2.12*  --   HCT 23.2* 20.2* 24.6*  PLT 159 111*  --    Recent Labs    12/21/22 0339 12/22/22 0456  NA 131* 132*  K 4.7 4.0  CL 94* 96*  CO2 24 25  BUN 15 15  CREATININE 0.97 0.81  GLUCOSE 186* 110*  CALCIUM 7.8* 7.9*   Recent Labs    12/20/22 0800  INR 1.1     EXAM General - Patient is Alert and Confused Extremity - ABD soft Neurovascular intact Dorsiflexion/Plantar flexion intact No cellulitis present Compartment soft Dressing/Incision - Minimal bloody drainage to the distal incision.  Prevena intact to the proximal incision site.  Mild bloody drainage noted.  No change to drainage  to the distal incision. Motor Function - intact, moving foot and toes well on exam.  Abdomen soft with intact bowel sounds this morning.  Past Medical History:  Diagnosis Date   Cataract    Depression     Assessment/Plan: 3 Days Post-Op Procedure(s) (LRB): OPEN REDUCTION INTERNAL FIXATION HIP (Right) Principal Problem:   Hip fracture (HCC) Active Problems:   Closed right hip fracture (HCC)  Estimated body mass index is 19.49 kg/m as calculated from the following:   Height as of this encounter: 5\' 3"  (1.6 m).   Weight as of this encounter: 49.9 kg. Advance diet Up with therapy D/C IV fluids when tolerating po intake.  Labs reviewed this AM.  BP 116/65. Patient transfused 1 unit PRBC yesterday.  Hg afterwards was 8.3, will re check this morning.  Lovenox d/c by internal medicine. Patient is confused, history of alcohol abuse.  Continue CIWA protocol Up with therapy today.  Current plan is for d/c to SNF. Continue with Prevena woundvac. She is urinating well without issues.  Continue to work on BM.  DVT Prophylaxis - TED hose Weight-Bearing as tolerated to right leg  J. Horris Latino, PA-C [facility] 12/23/2022, 7:19 AM   ADDENDUM: The patient was attempting to get out of the chair unassisted when I entered the room.  The patient's sister was in the room and trying to keep her in the  chair.  The patient was somewhat confused.  The patient required 2 person max assist with stand pivot transfer to the bed.  Slow progress with physical therapy.   TOC assisting with SND placement.  Lasheka Kempner P. Angie Fava M.D.

## 2022-12-23 NOTE — Progress Notes (Signed)
  Progress Note   Patient: Jill Hayes DOB: 08-08-1950 DOA: 12/20/2022     3 DOS: the patient was seen and examined on 12/23/2022   Brief hospital course:  Jill Hayes is a 72 y.o. female with medical history significant of alcohol abuse, cigarette smoking, presented with mechanical fall and right hip pain.   Patient woke up this morning and going to kitchen but " legs gave up" and fell on the right side and hit right hip.  Denies any chest pain shortness of breath lightheadedness or any other prodromes and no LOC, no head injury.  She was able to sit up and then stand up on her own right after the fall and walk back to bathroom however started to feel extruding pain of the right hip and called ambulance.  At baseline, active, able to walk 15-20 minutes without shortness of breath.   ED Course: Afebrile, blood pressure elevated, nonhypoxic.  CT right hip showed comminuted impacted and displaced intertrochanteric fracture of proximal right femur.  Other trauma scan including CT head and neck negative for acute findings.       Assessment and Plan:  History of alcohol abuse Alcohol withdrawal? Confused yesterday, concern for withdrawal, though no need for prn ativan last 24 Continue ciwa ativan Continue MVI/thiamine/folic acid - f/u b12  Right femoral intertrochanteric fracture Secondary to mechanical fall.   Denied any symptoms of prodromes of syncope and near syncope. Status post ORIF of right hip (POD 3) Continue pain control Continue physical therapy For snf  Start miralax  Severe malnutrition related to chronic illness Related to chronic illness, alcohol abuse as evidenced by moderate to severe fat depletion and moderate to severe muscle depletion Liberalize diet to regular for widest variety of meal selections Ensure Enlive po TID, each supplement provides 350 kcal and 20 grams of protein.  Continue MVI with minerals daily Continue 1 mg folic acid  daily Continue 408 mg thiamine daily     Anemia Noted to have a drop in her H&H from 12.5 >> 7.1 on 11/23, s/p 1 unit with appropriate rise, assume this is surgical blood loss Discussed w/ ortho, ok to resume lovenox today, will monitor closely    Cigarette smoking -Cessation education performed at bedside -Continue nicotine patch at 21 mg daily   Hyponatremia Most likely secondary to poor oral intake and alcohol abuse Monitor sodium levels closely          Subjective: moderate pain, tolerated some breakfast  Physical Exam: Vitals:   12/22/22 2350 12/23/22 0045 12/23/22 0046 12/23/22 0811  BP: 126/62 116/65 116/65 116/62  Pulse: 78 74 74 74  Resp:    16  Temp:    98.6 F (37 C)  TempSrc:      SpO2:    99%  Weight:      Height:       Eyes: PERRL, lids and conjunctivae normal ENMT: Mucous membranes are moist.  Neck: supple Respiratory: clear to auscultation bilaterally Cardiovascular: Regular rate and rhythm, no murmurs / rubs / gallops.   Abdomen: no tenderness, nondistended.  Skin: wound vac right hip Neurologic: moving all 4 Psychiatric: aao x3     Family Communication: sister updated telephonically 11/24  Disposition: Status is: Inpatient Remains inpatient appropriate because: pending snf placement  Planned Discharge Destination: Skilled nursing facility    Time spent: 35 minutes  Author: Silvano Bilis, MD 12/23/2022 11:10 AM  For on call review [email or web address].

## 2022-12-24 DIAGNOSIS — S72001A Fracture of unspecified part of neck of right femur, initial encounter for closed fracture: Secondary | ICD-10-CM | POA: Diagnosis not present

## 2022-12-24 LAB — BPAM RBC
Blood Product Expiration Date: 202412212359
Blood Product Expiration Date: 202412272359
Blood Product Expiration Date: 202411282359
ISSUE DATE / TIME: 202411231019
Unit Type and Rh: 6200
Unit Type and Rh: 5100
Unit Type and Rh: 6200

## 2022-12-24 LAB — TYPE AND SCREEN
ABO/RH(D): A POS
Antibody Screen: POSITIVE
Unit division: 0
Unit division: 0
Unit division: 0
Unit division: 0

## 2022-12-24 LAB — BASIC METABOLIC PANEL
Anion gap: 9 (ref 5–15)
BUN: 11 mg/dL (ref 8–23)
CO2: 28 mmol/L (ref 22–32)
Calcium: 8.5 mg/dL — ABNORMAL LOW (ref 8.9–10.3)
Chloride: 96 mmol/L — ABNORMAL LOW (ref 98–111)
Creatinine, Ser: 0.6 mg/dL (ref 0.44–1.00)
GFR, Estimated: >60 mL/min (ref >60)
Glucose, Bld: 115 mg/dL — ABNORMAL HIGH (ref 70–99)
Potassium: 4 mmol/L (ref 3.5–5.1)
Sodium: 133 mmol/L — ABNORMAL LOW (ref 135–145)

## 2022-12-24 LAB — CBC
HCT: 26 % — ABNORMAL LOW (ref 36.0–46.0)
Hemoglobin: 8.8 g/dL — ABNORMAL LOW (ref 12.0–15.0)
MCH: 31.1 pg (ref 26.0–34.0)
MCHC: 33.8 g/dL (ref 30.0–36.0)
MCV: 91.9 fL (ref 80.0–100.0)
Platelets: 173 10*3/uL (ref 150–400)
RBC: 2.83 MIL/uL — ABNORMAL LOW (ref 3.87–5.11)
RDW: 17.2 % — ABNORMAL HIGH (ref 11.5–15.5)
WBC: 3.3 10*3/uL — ABNORMAL LOW (ref 4.0–10.5)
nRBC: 0 % (ref 0.0–0.2)

## 2022-12-24 MED ORDER — ENOXAPARIN SODIUM 40 MG/0.4ML IJ SOSY: 40.0000 mg | PREFILLED_SYRINGE | INTRAMUSCULAR | 0 refills | Status: DC

## 2022-12-24 MED ORDER — HYDROCODONE-ACETAMINOPHEN 5-325 MG PO TABS: 1.0000 | ORAL_TABLET | Freq: Four times a day (QID) | ORAL | 0 refills | Status: DC | PRN

## 2022-12-24 NOTE — Care Management Important Message (Signed)
Important Message  Patient Details  Name: KAMARRI ASAI MRN: [id number] Date of Birth: [birth year]   Important Message Given:  Yes - Medicare IM     Olegario Messier A Idonna Heeren 12/24/2022, 11:25 AM

## 2022-12-24 NOTE — Progress Notes (Signed)
PT Cancellation Note  Patient Details Name: Jill Hayes MRN: [id number] DOB: 1950/02/07   Cancelled Treatment:     Attempted PT x 2 today, pt refused, unable to encourage any activity. Pt able to state why she is in the hospital. Education provided to pt regarding importance of mobility. Pt continued to refuse. Will re-attempt next available date/time per POC. Pt awaiting STR placement once medically cleared.   Jannet Askew 12/24/2022, 12:29 PM

## 2022-12-24 NOTE — Discharge Instructions (Addendum)
Diet: As you were doing prior to hospitalization   Shower:  May shower but keep the wounds dry, use an occlusive plastic wrap, NO SOAKING IN TUB.  If the bandage gets wet, change with a clean dry gauze.  Dressing:  Stevenson Clinch on until Friday 12/28/22.  Can switch to dry dressing at this time.  Staple can be removed on 01/03/23 by SNF.  Activity:  Increase activity slowly as tolerated, but follow the weight bearing instructions below.  No lifting or driving for 6 weeks.  Weight Bearing:   Weight bearing as tolerated to right lower extremity  To prevent constipation: you may use a stool softener such as -  Colace (over the counter) 100 mg by mouth twice a day  Drink plenty of fluids (prune juice may be helpful) and high fiber foods Miralax (over the counter) for constipation as needed.    Itching:  If you experience itching with your medications, try taking only a single pain pill, or even half a pain pill at a time.  You may take up to 10 pain pills per day, and you can also use benadryl over the counter for itching or also to help with sleep.   Precautions:  If you experience chest pain or shortness of breath - call 911 immediately for transfer to the hospital emergency department!!  If you develop a fever greater that 101 F, purulent drainage from wound, increased redness or drainage from wound, or calf pain-Call [facility]                                               Follow- Up Appointment:  Please call for an appointment to be seen in 6 weeks at [facility]

## 2022-12-24 NOTE — TOC Progression Note (Signed)
Transition of Care (TOC) - Progression Note    Patient Details  Name: Jill Hayes MRN: [id number] Date of Birth: 1950/10/06  [facility]) CM/SW Contact  Marlowe Sax, RN Phone Number: 12/24/2022, 4:41 PM  Clinical Narrative:    Met with the patient int he room, she lives at home alone, She is confused I called her sister [name] and she stated that she needs her sister to go to rehab I explained that her next of Kin would need to make decisions for her, I asked her if the patient has children, she stated that she has 2 sons, I explained that they would be the next of kin unless she has POA for the patient, she stated that she doe snot have POA, she is going to find the son's phone number and call me back with it, She stated that the patient had planned to go to [facility] but wants to go locally, I explained that it would depend on who can offer a bed but we would certainly try to find one locally,  She stated understanding and will call me back with the son's number  Expected Discharge Plan: Skilled Nursing Facility Barriers to Discharge: SNF Pending bed offer, Insurance Authorization  Expected Discharge Plan and Services   Discharge Planning Services: CM Consult   Living arrangements for the past 2 months: Single Family Home                   DME Agency: NA       HH Arranged: NA           Social Determinants of Health (SDOH) Interventions SDOH Screenings   Food Insecurity: No Food Insecurity (12/20/2022)  Housing: Low Risk  (12/20/2022)  Transportation Needs: No Transportation Needs (12/20/2022)  Utilities: Not At Risk (12/20/2022)  Alcohol Screen: Low Risk  (09/18/2022)  Depression (PHQ2-9): Low Risk  (09/18/2022)  Financial Resource Strain: Low Risk  (09/18/2022)  Physical Activity: Inactive (09/18/2022)  Social Connections: Socially Isolated (09/18/2022)  Stress: No Stress Concern Present (09/18/2022)  Tobacco Use: High Risk (12/20/2022)  Health Literacy:  Adequate Health Literacy (09/18/2022)    Readmission Risk Interventions     No data to display

## 2022-12-24 NOTE — Progress Notes (Signed)
Subjective: 4 Days Post-Op Procedure(s) (LRB): OPEN REDUCTION INTERNAL FIXATION HIP (Right) Patient reports pain as mild in the right hip this morning. Patient is well but still with some confusion.  Seems to be better when compared to yesterday. She is alert to herself and remembers seeing me yesterday. Current plan is for SNF following discharge. Prevena woundvac was applied to the proximal hip incision.  Mild bloody drainage noted, no additional drainage. Negative for chest pain and shortness of breath Fever: no Gastrointestinal:Negative for nausea and vomiting  Objective: Vital signs in last 24 hours: Temp:  [98 F (36.7 C)-98.6 F (37 C)] 98.4 F (36.9 C) (11/25 0014) Pulse Rate:  [74-88] 82 (11/25 0014) Resp:  [16-18] 16 (11/25 0014) BP: (116-140)/(62-79) 128/64 (11/25 0014) SpO2:  [99 %-100 %] 100 % (11/25 0014)  Intake/Output from previous day:  Intake/Output Summary (Last 24 hours) at 12/24/2022 0742 Last data filed at 12/23/2022 0900 Gross per 24 hour  Intake 0 ml  Output --  Net 0 ml     Intake/Output this shift: No intake/output data recorded.  Labs: Recent Labs    12/22/22 0457 12/22/22 1507 12/23/22 0812  HGB 7.1* 8.3* 8.5*   Recent Labs    12/22/22 0457 12/22/22 1507 12/23/22 0812  WBC 5.6  --  4.1  RBC 2.12*  --  2.74*  HCT 20.2* 24.6* 25.2*  PLT 111*  --  130*   Recent Labs    12/22/22 0456  NA 132*  K 4.0  CL 96*  CO2 25  BUN 15  CREATININE 0.81  GLUCOSE 110*  CALCIUM 7.9*   No results for input(s): "LABPT", "INR" in the last 72 hours.  EXAM General - Patient is Alert and Lacking Extremity - ABD soft Neurovascular intact Dorsiflexion/Plantar flexion intact No cellulitis present Compartment soft Dressing/Incision - Minimal bloody drainage to the distal incision.  Prevena intact to the proximal incision site.  Mild bloody drainage noted.  No change to drainage to the distal incision.  No additional drainage from the proximal  incision is noted. Motor Function - intact, moving foot and toes well on exam.  Abdomen soft with intact bowel sounds this morning.  Past Medical History:  Diagnosis Date   Cataract    Depression     Assessment/Plan: 4 Days Post-Op Procedure(s) (LRB): OPEN REDUCTION INTERNAL FIXATION HIP (Right) Principal Problem:   Hip fracture (HCC) Active Problems:   Closed right hip fracture (HCC)  Estimated body mass index is 19.49 kg/m as calculated from the following:   Height as of this encounter: 5\' 3"  (1.6 m).   Weight as of this encounter: 49.9 kg. Advance diet Up with therapy D/C IV fluids when tolerating po intake.  Hg 8.5 yesterday, s/p transfusion of 1 unit PRBC on 12/22/22 Patient is confused, history of alcohol abuse.  Continue CIWA protocol.  Seems better this AM. Up with therapy today.  Current plan is for d/c to SNF. Continue with Prevena woundvac.  Leave intact for additional 5 days, can switch to dry dressing on 12/28/22. She is urinating well without issues.  Continue to work on a BM.  Following discharge, continue with Lovenox 40mg  daily for DVT prophylaxis. Dry dressing can be applied on 12/28/22 and Prevena removed. Staples can be removed by SNF on 01/03/23.  Follow-up with [facility] in 6 weeks for x-rays of the right femur.  DVT Prophylaxis - TED hose and lovenox. Weight-Bearing as tolerated to right leg  J. Horris Latino, PA-C [facility] 12/24/2022, 7:42 AM

## 2022-12-24 NOTE — Progress Notes (Signed)
OT Cancellation Note  Patient Details Name: Jill Hayes MRN: [id number] DOB: Dec 16, 1950   Cancelled Treatment:    Reason Eval/Treat Not Completed: Patient declined, no reason specified. OT attempted to co-tx with PTA; per PTA, pt stating she did not want to participate or mobilize today.   Alvester Morin 12/24/2022, 12:11 PM

## 2022-12-24 NOTE — Progress Notes (Signed)
Progress Note   Patient: Jill Hayes VHQ:[id number] DOB: Jan 25, 1951 DOA: 12/20/2022     4 DOS: the patient was seen and examined on 12/24/2022   Brief hospital course:  Jill Hayes is a 72 y.o. female with medical history significant of alcohol abuse, cigarette smoking, presented with mechanical fall and right hip pain.   Patient woke up this morning and going to kitchen but " legs gave up" and fell on the right side and hit right hip.  Denies any chest pain shortness of breath lightheadedness or any other prodromes and no LOC, no head injury.  She was able to sit up and then stand up on her own right after the fall and walk back to bathroom however started to feel extruding pain of the right hip and called ambulance.  At baseline, active, able to walk 15-20 minutes without shortness of breath.   ED Course: Afebrile, blood pressure elevated, nonhypoxic.  CT right hip showed comminuted impacted and displaced intertrochanteric fracture of proximal right femur.  Other trauma scan including CT head and neck negative for acute findings.        Assessment and Plan:  History of alcohol abuse Alcohol withdrawal symptoms Still confused but improved Oriented only to person and place but not to time Continue Ativan per CIWA protocol Continue MVI/thiamine/folic acid Will order telemetry sitter as patient is at high risk for falls     Right femoral intertrochanteric fracture Secondary to mechanical fall.   Denied any symptoms of prodromes of syncope and near syncope. Status post ORIF of right hip (POD 4) Continue pain control Continue physical therapy         Severe malnutrition related to chronic illness Related to chronic illness, alcohol abuse as evidenced by moderate to severe fat depletion and moderate to severe muscle depletion Liberalize diet to regular for widest variety of meal selections Ensure Enlive po TID, each supplement provides 350 kcal and 20 grams of protein.   Continue MVI with minerals daily Continue 1 mg folic acid daily Continue 100 mg thiamine daily       Anemia Noted to have a drop in her H&H from 12.5 >> 7.1 ??  Related to recent surgery Patient was transfused 1 unit of packed RBC Will resume Lovenox and monitor H&H closely     Cigarette smoking -Cessation education performed at bedside -Continue nicotine patch at 21 mg daily       Hyponatremia Most likely secondary to poor oral intake and alcohol abuse Monitor sodium levels closely           Subjective: Sleeping but arouses easily.  Oriented to person and place but not to time  Physical Exam: Vitals:   12/23/22 2221 12/24/22 0014 12/24/22 0900 12/24/22 1033  BP: 135/68 128/64 126/75 126/75  Pulse: 88 82 87 87  Resp:  16    Temp:  98.4 F (36.9 C) 97.7 F (36.5 C)   TempSrc:   Oral   SpO2:  100% 100%   Weight:      Height:        Eyes: PERRL, lids and conjunctivae normal ENMT: Mucous membranes are moist. Posterior pharynx clear of any exudate or lesions.Normal dentition.  Neck: normal, supple, no masses, no thyromegaly Respiratory: clear to auscultation bilaterally, no wheezing, no crackles. Normal respiratory effort. No accessory muscle use.  Cardiovascular: Regular rate and rhythm, no murmurs / rubs / gallops. No extremity edema. 2+ pedal pulses. No carotid bruits.  Abdomen: no tenderness, no  masses palpated. No hepatosplenomegaly. Bowel sounds positive.  Musculoskeletal: SCDs in place, decreased range of motion right hip Skin: no rashes, lesions, ulcers. No induration Neurologic: Unable to assess Psychiatric: Confused.  Oriented to person and place   Data Reviewed: Hemoglobin 8.8 There are no new results to review at this time.  Family Communication: None  Disposition: Status is: Inpatient Remains inpatient appropriate because: Awaiting placement  Planned Discharge Destination: Skilled nursing facility    Time spent: 33  minutes  Author: Lucile Shutters, MD 12/24/2022 1:07 PM  For on call review [email or web address].

## 2022-12-24 NOTE — Plan of Care (Signed)

## 2022-12-25 DIAGNOSIS — S72001A Fracture of unspecified part of neck of right femur, initial encounter for closed fracture: Secondary | ICD-10-CM | POA: Diagnosis not present

## 2022-12-25 NOTE — Progress Notes (Signed)
Occupational Therapy Treatment Patient Details Name: Jill Hayes MRN: [id number] DOB: 01-Jan-1951 Today's Date: 12/25/2022   History of present illness Pt is a 72 y.o. female presented with mechanical fall and R hip pain, now s/p R hip ORIF. PMH includes alcohol abuse, cigarette smoking, anxiety, depression, and cataracts.   OT comments  Pt seen for OT tx and cotx with PT to maximize safety with ADL/mobility. Pt received in bed, agreeable but anxious about pain. Reassurance and emotional support provided. It took several partial attempts to fully complete sup>sit EOB 2/2 R hip pain. Pt required +2 for safety during session for bed mobility and transition to recliner. Set up for grooming tasks. Pt declined use of BSC for toileting during session. Pt progressing towards goals, however, pain continues to be a limiting factor.       If plan is discharge home, recommend the following:  A lot of help with walking and/or transfers;A lot of help with bathing/dressing/bathroom   Equipment Recommendations  BSC/3in1    Recommendations for Other Services      Precautions / Restrictions Precautions Precautions: Fall Restrictions Weight Bearing Restrictions: Yes RLE Weight Bearing: Weight bearing as tolerated       Mobility Bed Mobility Overal bed mobility: Needs Assistance Bed Mobility: Supine to Sit     Supine to sit: Min assist, HOB elevated, Used rails, +2 for safety/equipment     General bed mobility comments: increased time due to pain; +2 for safety only    Transfers Overall transfer level: Needs assistance Equipment used: None Transfers: Bed to chair/wheelchair/BSC, Sit to/from Stand Sit to Stand: Mod assist, From elevated surface     Step pivot transfers: Mod assist     General transfer comment: Mod assist to stand from EOB and recliner. max vcs for technique. pt is impulsive and required constant vcs for sequencing and safety     Balance Overall balance assessment:  Needs assistance Sitting-balance support: No upper extremity supported, Feet supported Sitting balance-Leahy Scale: Fair Sitting balance - Comments: pt impulsive and wavering throughout, no LOB at EOB   Standing balance support: Bilateral upper extremity supported, During functional activity Standing balance-Leahy Scale: Poor Standing balance comment: pt is high fall risk due to impulsivity and pain                           ADL either performed or assessed with clinical judgement   ADL                                         General ADL Comments: Set up for seated grooming, pt declined BSC for toileting    Extremity/Trunk Assessment              Vision       Perception     Praxis      Cognition Arousal: Alert Behavior During Therapy: WFL for tasks assessed/performed, Lability, Anxious Overall Cognitive Status: Within Functional Limits for tasks assessed                                          Exercises      Shoulder Instructions       General Comments L hip wound vac intact and draining.    Pertinent  Vitals/ Pain       Pain Assessment Pain Assessment: Faces Faces Pain Scale: Hurts whole lot Pain Location: R hip Pain Descriptors / Indicators: Aching, Discomfort, Burning, Guarding, Grimacing, Moaning Pain Intervention(s): Limited activity within patient's tolerance, Monitored during session, Premedicated before session, Repositioned, Patient requesting pain meds-RN notified, Ice applied  Home Living                                          Prior Functioning/Environment              Frequency  Min 1X/week        Progress Toward Goals  OT Goals(current goals can now be found in the care plan section)  Progress towards OT goals: Progressing toward goals  Acute Rehab OT Goals Patient Stated Goal: to improve pain OT Goal Formulation: With patient Time For Goal Achievement:  01/04/23 Potential to Achieve Goals: Fair  Plan      Co-evaluation    PT/OT/SLP Co-Evaluation/Treatment: Yes Reason for Co-Treatment: For patient/therapist safety PT goals addressed during session: Mobility/safety with mobility;Balance OT goals addressed during session: ADL's and self-care      AM-PAC OT "6 Clicks" Daily Activity     Outcome Measure   Help from another person eating meals?: None Help from another person taking care of personal grooming?: A Little Help from another person toileting, which includes using toliet, bedpan, or urinal?: A Lot Help from another person bathing (including washing, rinsing, drying)?: A Lot Help from another person to put on and taking off regular upper body clothing?: A Little Help from another person to put on and taking off regular lower body clothing?: A Lot 6 Click Score: 16    End of Session    OT Visit Diagnosis: Other abnormalities of gait and mobility (R26.89);Muscle weakness (generalized) (M62.81)   Activity Tolerance Patient limited by pain;Patient tolerated treatment well   Patient Left in chair;with call bell/phone within reach;with nursing/sitter in room   Nurse Communication Patient requests pain meds        Time: 1030-1053 OT Time Calculation (min): 23 min  Charges: OT General Charges $OT Visit: 1 Visit OT Treatments $Self Care/Home Management : 8-22 mins  Arman Filter., MPH, MS, OTR/L ascom [phone] 12/25/22, 1:15 PM

## 2022-12-25 NOTE — TOC Progression Note (Signed)
Transition of Care [facility]) - Progression Note    Patient Details  Name: Jill Hayes MRN: [id number] Date of Birth: [birth year]  [facility]) CM/SW Contact  Marlowe Sax, RN Phone Number: 12/25/2022, 11:19 AM  Clinical Narrative:    Received a call from sister providing son's information his name is Jill Hayes [phone]  I called [name] and reviewed the bed  The patient used to work at [facility] care and would prefer not to go  there  He stated that he would come to the hospital And speak with her to determine what they will do I explained to him that we will have to have a decision today so that we can start Ins auth and to ensure she has a bed He will take a look at them and review Medicare.gov star rating and will call me with their choice   Expected Discharge Plan: Skilled Nursing Facility Barriers to Discharge: SNF Pending bed offer, Insurance Authorization  Expected Discharge Plan and Services   Discharge Planning Services: CM Consult   Living arrangements for the past 2 months: Single Family Home                   DME Agency: NA       HH Arranged: NA           Social Determinants of Health (SDOH) Interventions SDOH Screenings   Food Insecurity: No Food Insecurity (12/20/2022)  Housing: Low Risk  (12/20/2022)  Transportation Needs: No Transportation Needs (12/20/2022)  Utilities: Not At Risk (12/20/2022)  Alcohol Screen: Low Risk  (09/18/2022)  Depression (PHQ2-9): Low Risk  (09/18/2022)  Financial Resource Strain: Low Risk  (09/18/2022)  Physical Activity: Inactive (09/18/2022)  Social Connections: Socially Isolated (09/18/2022)  Stress: No Stress Concern Present (09/18/2022)  Tobacco Use: High Risk (12/20/2022)  Health Literacy: Adequate Health Literacy (09/18/2022)    Readmission Risk Interventions     No data to display

## 2022-12-25 NOTE — TOC Progression Note (Addendum)
Transition of Care [facility]) - Progression Note    Patient Details  Name: [name] MRN: [id number] Date of Birth: 07-28-1950  Transition of Care [facility]) CM/SW Contact  Marlowe Sax, RN Phone Number: 12/25/2022, 3:01 PM  Clinical Narrative:    Son called and provided with the bed choice of [facility] care I notified Archie Patten and started Ins auth process Ins approved to go to [facility] Auth ID [id number] 11/27-12/02 Expected Discharge Plan: Skilled Nursing Facility Barriers to Discharge: SNF Pending bed offer, Insurance Authorization  Expected Discharge Plan and Services   Discharge Planning Services: CM Consult   Living arrangements for the past 2 months: Single Family Home                   DME Agency: NA       HH Arranged: NA           Social Determinants of Health (SDOH) Interventions SDOH Screenings   Food Insecurity: No Food Insecurity (12/20/2022)  Housing: Low Risk  (12/20/2022)  Transportation Needs: No Transportation Needs (12/20/2022)  Utilities: Not At Risk (12/20/2022)  Alcohol Screen: Low Risk  (09/18/2022)  Depression (PHQ2-9): Low Risk  (09/18/2022)  Financial Resource Strain: Low Risk  (09/18/2022)  Physical Activity: Inactive (09/18/2022)  Social Connections: Socially Isolated (09/18/2022)  Stress: No Stress Concern Present (09/18/2022)  Tobacco Use: High Risk (12/20/2022)  Health Literacy: Adequate Health Literacy (09/18/2022)    Readmission Risk Interventions     No data to display

## 2022-12-25 NOTE — Progress Notes (Signed)
Physical Therapy Treatment Patient Details Name: Jill Hayes MRN: [id number] DOB: [birth year] Today's Date: 12/25/2022   History of Present Illness Pt is a 72 y.o. female presented with mechanical fall and R hip pain, now s/p R hip ORIF. PMH includes alcohol abuse, cigarette smoking, anxiety, depression, and cataracts.    PT Comments  Pt received in bed, slightly more agreeable to mobility with PT/OT co-tx. Pt required increased time and repeated cues to transfer to EOB with HOB raised and use of rail, initially throwing herself backward due to c/o R hip "burning pain". Good static sitting balance EOB with feet supported. Pt able to scoot self to Left to prepare for bed to chair transfer via step pivot in standing with ModA to weight shift. Pt positioned to comfort in chair with LE's elevated. All needs in reach.    If plan is discharge home, recommend the following: A lot of help with walking and/or transfers;A lot of help with bathing/dressing/bathroom;Assistance with cooking/housework;Direct supervision/assist for medications management;Direct supervision/assist for financial management;Assist for transportation;Help with stairs or ramp for entrance;Supervision due to cognitive status   Can travel by private vehicle     No  Equipment Recommendations  Other (comment) (Defer to next level of care)    Recommendations for Other Services       Precautions / Restrictions Precautions Precautions: Fall Restrictions Weight Bearing Restrictions: Yes RLE Weight Bearing: Weight bearing as tolerated     Mobility  Bed Mobility Overal bed mobility: Needs Assistance Bed Mobility: Supine to Sit     Supine to sit: Min assist, HOB elevated, Used rails     General bed mobility comments: increased time due to pain    Transfers Overall transfer level: Needs assistance Equipment used: None Transfers: Bed to chair/wheelchair/BSC, Sit to/from Stand Sit to Stand: Mod assist, From elevated  surface   Step pivot transfers: Mod assist       General transfer comment: Mod assist to stand from EOB and recliner. max vcs for technique. pt is impulsive and required constant vcs for sequencing and safety    Ambulation/Gait                   Stairs             Wheelchair Mobility     Tilt Bed    Modified Rankin (Stroke Patients Only)       Balance Overall balance assessment: Needs assistance Sitting-balance support: No upper extremity supported, Feet supported Sitting balance-Leahy Scale: Fair Sitting balance - Comments: pt impulsive and wavering throughout, no LOB at EOB   Standing balance support: Bilateral upper extremity supported, During functional activity Standing balance-Leahy Scale: Poor Standing balance comment: pt is high fall risk due to impulsivity and pain                            Cognition Arousal: Alert Behavior During Therapy: WFL for tasks assessed/performed, Lability Overall Cognitive Status: Within Functional Limits for tasks assessed                                 General Comments: Cognition appears to be clearing. Pt willing to participate with education and frequest rest breaks        Exercises Other Exercises Other Exercises: Pt educated on role of PT and importance of increasing OOB activity despite R hip discomfort    General  Comments General comments (skin integrity, edema, etc.): L hip wound vac intact and draining.      Pertinent Vitals/Pain Pain Assessment Pain Assessment: Faces Faces Pain Scale: Hurts little more Pain Location: R hip Pain Descriptors / Indicators: Aching, Discomfort, Burning Pain Intervention(s): Limited activity within patient's tolerance, Patient requesting pain meds-RN notified, Ice applied    Home Living                          Prior Function            PT Goals (current goals can now be found in the care plan section) Acute Rehab PT  Goals Patient Stated Goal: Go home Progress towards PT goals: Progressing toward goals    Frequency    BID      PT Plan      Co-evaluation PT/OT/SLP Co-Evaluation/Treatment: Yes Reason for Co-Treatment: For patient/therapist safety PT goals addressed during session: Mobility/safety with mobility;Balance        AM-PAC PT "6 Clicks" Mobility   Outcome Measure  Help needed turning from your back to your side while in a flat bed without using bedrails?: A Little Help needed moving from lying on your back to sitting on the side of a flat bed without using bedrails?: A Lot Help needed moving to and from a bed to a chair (including a wheelchair)?: A Lot Help needed standing up from a chair using your arms (e.g., wheelchair or bedside chair)?: A Lot Help needed to walk in hospital room?: A Lot Help needed climbing 3-5 steps with a railing? : Total 6 Click Score: 12    End of Session Equipment Utilized During Treatment: Gait belt Activity Tolerance: Patient limited by pain Patient left: in chair;with call bell/phone within reach;with chair alarm set Nurse Communication: Mobility status;Patient requests pain meds PT Visit Diagnosis: Muscle weakness (generalized) (M62.81);Other abnormalities of gait and mobility (R26.89);Pain Pain - Right/Left: Right Pain - part of body: Hip     Time: 1130-1153 PT Time Calculation (min) (ACUTE ONLY): 23 min  Charges:    $Therapeutic Activity: 8-22 mins PT General Charges $$ ACUTE PT VISIT: 1 Visit                    Zadie Cleverly, PTA  Jannet Askew 12/25/2022, 12:38 PM

## 2022-12-25 NOTE — Progress Notes (Signed)
Progress Note   Patient: Jill Hayes ZOX:[id number] DOB: 08-18-1950 DOA: 12/20/2022     5 DOS: the patient was seen and examined on 12/25/2022   Brief hospital course:  Jill Hayes is a 72 y.o. female with medical history significant of alcohol abuse, cigarette smoking, presented with mechanical fall and right hip pain.   Patient woke up this morning and going to kitchen but " legs gave up" and fell on the right side and hit right hip.  Denies any chest pain shortness of breath lightheadedness or any other prodromes and no LOC, no head injury.  She was able to sit up and then stand up on her own right after the fall and walk back to bathroom however started to feel extruding pain of the right hip and called ambulance.  At baseline, active, able to walk 15-20 minutes without shortness of breath.   ED Course: Afebrile, blood pressure elevated, nonhypoxic.  CT right hip showed comminuted impacted and displaced intertrochanteric fracture of proximal right femur.  Other trauma scan including CT head and neck negative for acute findings.    Assessment and Plan:   History of alcohol abuse Alcohol withdrawal symptoms Still confused but improved.   Patient is oriented to person and place but not to time Continue Ativan per CIWA protocol       Right femoral intertrochanteric fracture Secondary to mechanical fall.   Denied any symptoms of prodromes of syncope and near syncope. Status post ORIF of right hip (POD 5) Continue pain control Continue physical therapy         Severe malnutrition related to chronic illness Related to chronic illness, alcohol abuse as evidenced by moderate to severe fat depletion and moderate to severe muscle depletion Liberalize diet to regular for widest variety of meal selections Ensure Enlive po TID, each supplement provides 350 kcal and 20 grams of protein.  Continue MVI with minerals daily Continue 1 mg folic acid daily Continue 100 mg thiamine  daily       Anemia Noted to have a drop in her H&H from 12.5 >> 7.1 ??  Related to recent surgery Patient was transfused 1 unit of packed RBC Continue Lovenox and monitor H&H      Cigarette smoking -Cessation education performed at bedside -Continue nicotine patch at 21 mg daily       Hyponatremia Most likely secondary to poor oral intake and alcohol abuse Monitor sodium levels closely Encourage oral intake               Subjective: Patient is seen and examined at the bedside.  Sitting up and eating breakfast  Physical Exam: Vitals:   12/24/22 1033 12/24/22 1834 12/24/22 2230 12/24/22 2336  BP: 126/75 139/84 114/63 134/73  Pulse: 87 81 90 92  Resp:  17  18  Temp:    99 F (37.2 C)  TempSrc:    Oral  SpO2:  96%  99%  Weight:      Height:        Eyes: PERRL, lids and conjunctivae normal ENMT: Mucous membranes are moist. Posterior pharynx clear of any exudate or lesions.Normal dentition.  Neck: normal, supple, no masses, no thyromegaly Respiratory: clear to auscultation bilaterally, no wheezing, no crackles. Normal respiratory effort. No accessory muscle use.  Cardiovascular: Regular rate and rhythm, no murmurs / rubs / gallops. No extremity edema. 2+ pedal pulses. No carotid bruits.  Abdomen: no tenderness, no masses palpated. No hepatosplenomegaly. Bowel sounds positive.  Musculoskeletal: SCDs  in place, decreased range of motion right hip Skin: no rashes, lesions, ulcers. No induration Neurologic: Weak, able to move all extremities pulm normal Psychiatric: Confused.  Oriented to person and place   Data Reviewed: Labs reviewed.  Sodium 133, hemoglobin 8.8 There are no new results to review at this time.  Family Communication: Plan of care discussed with patient  Disposition: Status is: Inpatient Remains inpatient appropriate because: Awaiting placement  Planned Discharge Destination: Skilled nursing facility    Time spent: 32  minutes  Author: Lucile Shutters, MD 12/25/2022 2:21 PM  For on call review [email or web address].

## 2022-12-26 DIAGNOSIS — S72001A Fracture of unspecified part of neck of right femur, initial encounter for closed fracture: Secondary | ICD-10-CM | POA: Diagnosis not present

## 2022-12-26 MED ORDER — POLYETHYLENE GLYCOL 3350 17 G PO PACK: 17.0000 g | PACK | Freq: Every day | ORAL | Status: DC

## 2022-12-26 MED ORDER — BISACODYL 10 MG RE SUPP: 10.0000 mg | Freq: Every day | RECTAL | Status: DC | PRN

## 2022-12-26 MED ORDER — ONDANSETRON HCL 4 MG PO TABS: 4.0000 mg | ORAL_TABLET | Freq: Four times a day (QID) | ORAL | Status: DC | PRN

## 2022-12-26 MED ORDER — DOCUSATE SODIUM 100 MG PO CAPS: 100.0000 mg | ORAL_CAPSULE | Freq: Two times a day (BID) | ORAL | Status: DC

## 2022-12-26 MED ORDER — SORBITOL 70 % SOLN
15.0000 mL | Freq: Every day | Status: DC | PRN
  Filled 2022-12-26: qty 30

## 2022-12-26 MED ORDER — ENSURE ENLIVE PO LIQD: 237.0000 mL | Freq: Three times a day (TID) | ORAL | Status: AC

## 2022-12-26 MED ORDER — MAGNESIUM CITRATE PO SOLN
1.0000 | Freq: Once | ORAL | Status: AC
  Administered 2022-12-26: 1 via ORAL
  Filled 2022-12-26: qty 296

## 2022-12-26 MED ORDER — NICOTINE 21 MG/24HR TD PT24: 21.0000 mg | MEDICATED_PATCH | Freq: Every day | TRANSDERMAL | Status: DC

## 2022-12-26 MED ORDER — METOPROLOL TARTRATE 25 MG PO TABS: 12.5000 mg | ORAL_TABLET | Freq: Two times a day (BID) | ORAL | Status: DC

## 2022-12-26 MED ORDER — VITAMIN B-1 100 MG PO TABS: 100.0000 mg | ORAL_TABLET | Freq: Every day | ORAL | Status: DC

## 2022-12-26 MED ORDER — FOLIC ACID 1 MG PO TABS: 1.0000 mg | ORAL_TABLET | Freq: Every day | ORAL | Status: DC

## 2022-12-26 NOTE — Progress Notes (Signed)
Nutrition Follow-up  DOCUMENTATION CODES:   Severe malnutrition in context of chronic illness  INTERVENTION:   -Continue liberalized diet of regular for widest variety of meal selections -Continue Ensure Enlive po TID, each supplement provides 350 kcal and 20 grams of protein.  -Continue MVI with minerals daily -Continue 1 mg folic acid daily -Continue 161 mg thiamine daily  NUTRITION DIAGNOSIS:   Severe Malnutrition related to chronic illness (ETOH abuse) as evidenced by moderate fat depletion, severe fat depletion, moderate muscle depletion, severe muscle depletion.  Ongoing  GOAL:   Patient will meet greater than or equal to 90% of their needs  Progressing   MONITOR:   PO intake, Supplement acceptance  REASON FOR ASSESSMENT:   Consult Assessment of nutrition requirement/status  ASSESSMENT:   Pt with medical history significant of alcohol abuse, cigarette smoking, presented with mechanical fall and right hip pain.  11/23- s/p Procedure(s) with comments: OPEN REDUCTION INTERNAL FIXATION HIP (Right) - intermediate TFN ( Synthes) with C- arm  and asssistant  Reviewed I/O's: +120 ml x 24 hours and +245 ml since admission   Pt receiving personal care at time of visit.   Pt remains on a regular diet with fair oral intake. Noted meal completions 50%> Pt is also consuming Ensure supplements.   No new wt since admission.   Per TOC notes, pt awaiting insurance authorization for SNF.   Medications reviewed and include folic acid, colace, miralax, and thiamine.   Labs reviewed: Na: 133, CBGS: 210.   Diet Order:   Diet Order             Diet regular Room service appropriate? Yes; Fluid consistency: Thin  Diet effective now                   EDUCATION NEEDS:   Education needs have been addressed  Skin:  Skin Assessment: Skin Integrity Issues: Skin Integrity Issues:: Incisions Incisions: closed rt hip  Last BM:  12/25/22  Height:   Ht Readings from  Last 1 Encounters:  12/20/22 5\' 3"  (1.6 m)    Weight:   Wt Readings from Last 1 Encounters:  12/20/22 49.9 kg    Ideal Body Weight:  52.3 kg  BMI:  Body mass index is 19.49 kg/m.  Estimated Nutritional Needs:   Kcal:  1550-1750  Protein:  85-100 grams  Fluid:  > 1.5 L    Levada Schilling, RD, LDN, CDCES Registered Dietitian III Certified Diabetes Care and Education Specialist Please refer to [facility] for RD and/or RD on-call/weekend/after hours pager

## 2022-12-26 NOTE — Discharge Summary (Addendum)
Physician Discharge Summary   Patient: Jill Hayes MRN: [id number] DOB: March 22, 1950  Admit date:     12/20/2022  Discharge date: 12/28/2022  Discharge Physician: Pennie Banter   PCP: Ronnald Ramp, MD   Recommendations at discharge:    Follow up with [facility] in 6 weeks Staples may be removed at SNF on 01/03/2023 Follow up with Primary Care in 1-2 weeks Repeat CBC, BMP, Mg at follow up Ortho recommends: Weight-bearing as tolerated on right leg Continue Lovenox for DVT prophylaxis  Discharge Diagnoses: Principal Problem:   Closed right hip fracture (HCC) Active Problems:   Hyponatremia   Severe protein-calorie malnutrition (HCC)   Anxiety and depression   History of smoking greater than 50 pack years  Resolved Problems:   Hip fracture [facility])  Hospital Course:  HPI on admission:  "Jill Hayes is a 72 y.o. female with medical history significant of alcohol abuse, cigarette smoking, presented with mechanical fall and right hip pain.   Patient woke up this morning and going to kitchen but " legs gave up" and fell on the right side and hit right hip.  Denies any chest pain shortness of breath lightheadedness or any other prodromes and no LOC, no head injury.  She was able to sit up and then stand up on her own right after the fall and walk back to bathroom however started to feel extruding pain of the right hip and called ambulance.  At baseline, active, able to walk 15-20 minutes without shortness of breath.   ED Course: Afebrile, blood pressure elevated, nonhypoxic.  CT right hip showed comminuted impacted and displaced intertrochanteric fracture of proximal right femur.  Other trauma scan including CT head and neck negative for acute findings."  Pt was admitted to hospital with Orthopedic surgery consulted.   She underwent ORIF on 12/20/2022 with Dr. Wilkie Aye.  Further hospital course and management as outlined below.    12/26/22 -- pt overall doing well  and clinically stable for discharge today.  Discharge was delayed as patient had not had a bowel movement in several days.  Constipation resolved with increased bowel regimen.   11/29 -- pt remains medically stable for discharge to rehab today.     Assessment and Plan:  Right femoral intertrochanteric fracture Secondary to mechanical fall.   Denied any symptoms of prodromes of syncope and near syncope. Status post ORIF of right hip 12/20/2022 --Continue pain control --Bowel regimen --Continue physical therapy at SNF  History of alcohol abuse Alcohol withdrawal symptoms - mild, resolved Mentation now baseline, mild confusion appears resolved Monitored on CIWA protocol - did not require medication    Severe malnutrition related to chronic illness Related to chronic illness, alcohol abuse as evidenced by moderate to severe fat depletion and moderate to severe muscle depletion --Liberalize diet to regular for widest variety of meal selections --Ensure Enlive po TID, each supplement provides 350 kcal and 20 grams of protein.  --Continue multivitamin, folic acid & thiamine daily   Postop Anemia / ABLA Noted to have a drop in her H&H from 12.5 >> 7.1 (transfused 1 unit pRBC's on 11/26) Hbg improved  >> 8.3 >> 8.5 >> 8.8 No evidence of active bleeding. --Repeat CBC in 1 week --Monitor closely while on Lovenox    Cigarette smoking -Cessation education performed at bedside -Nicotine patch at 21 mg daily    Hyponatremia - mild Na 133 today Most likely secondary to poor oral intake and alcohol abuse --Encourage oral intake --Repeat BMP  in 1-2 weeks       Consultants: Orthopedic surgery Procedures performed: as above   Disposition: Skilled nursing facility  Diet recommendation:  Discharge Diet Orders (From admission, onward)     Start     Ordered   12/26/22 0000  Diet general        12/26/22 1435            DISCHARGE MEDICATION: Allergies as of 12/26/2022        Reactions   Codeine Other (See Comments)   GI Upset        Medication List     TAKE these medications    acetaminophen 500 MG tablet Commonly known as: TYLENOL Take 500-1,000 mg by mouth every 6 (six) hours as needed for mild pain or moderate pain.   bisacodyl 10 MG suppository Commonly known as: DULCOLAX Place 1 suppository (10 mg total) rectally daily as needed (if no BM in 48 hours depsite Miralax & Colace).   Centrum Adult 50+ MultiGummies Chew Chew 1 Dose by mouth 1 day or 1 dose.   docusate sodium 100 MG capsule Commonly known as: COLACE Take 1 capsule (100 mg total) by mouth 2 (two) times daily.   enoxaparin 40 MG/0.4ML injection Commonly known as: LOVENOX Inject 0.4 mLs (40 mg total) into the skin daily.   feeding supplement Liqd Take 237 mLs by mouth 3 (three) times daily between meals.   folic acid 1 MG tablet Commonly known as: FOLVITE Take 1 tablet (1 mg total) by mouth daily. Start taking on: December 27, 2022   HYDROcodone-acetaminophen 5-325 MG tablet Commonly known as: NORCO/VICODIN Take 1 tablet by mouth every 6 (six) hours as needed for moderate pain (pain score 4-6).   ibuprofen 200 MG tablet Commonly known as: ADVIL Take 400-600 mg by mouth every 6 (six) hours as needed for mild pain or moderate pain.   Magnesium 400 MG Caps Take 2 capsules by mouth 1 day or 1 dose.   metoprolol tartrate 25 MG tablet Commonly known as: LOPRESSOR Take 0.5 tablets (12.5 mg total) by mouth 2 (two) times daily.   nicotine 21 mg/24hr patch Commonly known as: NICODERM CQ - dosed in mg/24 hours Place 1 patch (21 mg total) onto the skin daily. Start taking on: December 27, 2022   ondansetron 4 MG tablet Commonly known as: ZOFRAN Take 1 tablet (4 mg total) by mouth every 6 (six) hours as needed for nausea.   polyethylene glycol 17 g packet Commonly known as: MIRALAX / GLYCOLAX Take 17 g by mouth daily. Start taking on: December 27, 2022   thiamine 100 MG  tablet Commonly known as: Vitamin B-1 Take 1 tablet (100 mg total) by mouth daily. Start taking on: December 27, 2022        Contact information for follow-up providers     Anson Oregon, PA-C Follow up in 6 week(s).   Specialty: Physician Assistant Why: Cherlynn Polo can be removed on 01/03/23 by SNF. Follow-up with [facility] in 6 weeks for x-rays of the right femur. Contact information: [address] [place] [address] [phone]              Contact information for after-discharge care     Destination     [facility] .   Service: Skilled Paramedic information: [address] [place] [address] [phone]  Discharge Exam: Filed Weights   12/20/22 1010 12/20/22 1302  Weight: 49.9 kg 49.9 kg   General exam: awake, alert, no acute distress HEENT: atraumatic, clear conjunctiva, anicteric sclera, moist mucus membranes, hearing grossly normal  Respiratory system: CTAB, no wheezes, rales or rhonchi, normal respiratory effort. Cardiovascular system: normal S1/S2, RRR, no pedal edema.   Gastrointestinal system: soft, NT, ND, no HSM felt, +bowel sounds. Central nervous system: A&O . no gross focal neurologic deficits, normal speech Extremities: moves all, no edema, normal tone Skin: dry, intact, normal temperature Psychiatry: normal mood, congruent affect, judgement and insight appear normal   Condition at discharge: stable  The results of significant diagnostics from this hospitalization (including imaging, microbiology, ancillary and laboratory) are listed below for reference.   Imaging Studies: DG C-Arm 1-60 Min-No Report  Result Date: 12/20/2022 CLINICAL DATA:  Surgical internal fixation of right hip fracture. EXAM: DG HIP (WITH OR WITHOUT PELVIS) 1V RIGHT; DG C-ARM 1-60 MIN-NO REPORT Radiation exposure index: 18.85 mGy. COMPARISON:  Same day. FINDINGS: Two intraoperative  fluoroscopic images were obtained of the right hip. These demonstrate surgical internal fixation of comminuted proximal right femoral intertrochanteric fracture. IMPRESSION: Fluoroscopic guidance provided during surgical internal fixation of comminuted proximal right femoral intertrochanteric fracture. Electronically Signed   By: Lupita Raider M.D.   On: 12/20/2022 16:35   DG HIP UNILAT WITH PELVIS 1V RIGHT  Result Date: 12/20/2022 CLINICAL DATA:  Surgical internal fixation of right hip fracture. EXAM: DG HIP (WITH OR WITHOUT PELVIS) 1V RIGHT; DG C-ARM 1-60 MIN-NO REPORT Radiation exposure index: 18.85 mGy. COMPARISON:  Same day. FINDINGS: Two intraoperative fluoroscopic images were obtained of the right hip. These demonstrate surgical internal fixation of comminuted proximal right femoral intertrochanteric fracture. IMPRESSION: Fluoroscopic guidance provided during surgical internal fixation of comminuted proximal right femoral intertrochanteric fracture. Electronically Signed   By: Lupita Raider M.D.   On: 12/20/2022 16:35   DG C-Arm 1-60 Min-No Report  Result Date: 12/20/2022 CLINICAL DATA:  Surgical internal fixation of right hip fracture. EXAM: DG HIP (WITH OR WITHOUT PELVIS) 1V RIGHT; DG C-ARM 1-60 MIN-NO REPORT Radiation exposure index: 18.85 mGy. COMPARISON:  Same day. FINDINGS: Two intraoperative fluoroscopic images were obtained of the right hip. These demonstrate surgical internal fixation of comminuted proximal right femoral intertrochanteric fracture. IMPRESSION: Fluoroscopic guidance provided during surgical internal fixation of comminuted proximal right femoral intertrochanteric fracture. Electronically Signed   By: Lupita Raider M.D.   On: 12/20/2022 16:35   CT Hip Right Wo Contrast  Result Date: 12/20/2022 CLINICAL DATA:  Right hip trauma. EXAM: CT OF THE RIGHT HIP WITHOUT CONTRAST TECHNIQUE: Multidetector CT imaging of the right hip was performed according to the standard  protocol. Multiplanar CT image reconstructions were also generated. RADIATION DOSE REDUCTION: This exam was performed according to the departmental dose-optimization program which includes automated exposure control, adjustment of the mA and/or kV according to patient size and/or use of iterative reconstruction technique. COMPARISON:  None Available. FINDINGS: Bones/Joint/Cartilage There is diffuse osteopenia of the visualized osseous structures. There is comminuted, impacted and displaced intertrochanteric fracture of the proximal right femur. No other acute fracture or dislocation. No aggressive osseous lesion. Ligaments Suboptimally assessed by CT. Muscles and Tendons There is small intramuscular hematoma surrounding the right proximal femur fracture. Soft tissues There is a 2.8 x 4.6 cm dystrophic calcification in the posterior pelvis. Uterus is not distinctly seen, which may be surgically absent or atrophic and obscured by streak artifacts from this calcification. No  other focal lesion seen within the visualized soft tissues. IMPRESSION: *Comminuted, impacted and displaced intertrochanteric fracture of the proximal right femur. *No other acute fracture or dislocation seen. Electronically Signed   By: Jules Schick M.D.   On: 12/20/2022 09:40   DG Chest 1 View  Result Date: 12/20/2022 CLINICAL DATA:  Fall. EXAM: CHEST  1 VIEW COMPARISON:  Chest radiograph dated February 19, 2006. FINDINGS: The heart size and mediastinal contours are within normal limits. No focal consolidation. No pleural effusion or pneumothorax. No acute osseous abnormality. IMPRESSION: No acute findings in the chest. Electronically Signed   By: Hart Robinsons M.D.   On: 12/20/2022 09:38   CT Cervical Spine Wo Contrast  Result Date: 12/20/2022 CLINICAL DATA:  Head trauma, minor (Age >= 65y); Neck trauma (Age >= 65y) EXAM: CT HEAD WITHOUT CONTRAST CT CERVICAL SPINE WITHOUT CONTRAST TECHNIQUE: Multidetector CT imaging of the head and  cervical spine was performed following the standard protocol without intravenous contrast. Multiplanar CT image reconstructions of the cervical spine were also generated. RADIATION DOSE REDUCTION: This exam was performed according to the departmental dose-optimization program which includes automated exposure control, adjustment of the mA and/or kV according to patient size and/or use of iterative reconstruction technique. COMPARISON:  CT scan head from 03/19/2022. FINDINGS: CT HEAD FINDINGS Brain: No evidence of acute infarction, hemorrhage, hydrocephalus, extra-axial collection or mass lesion/mass effect. There is bilateral periventricular hypodensity, which is non-specific but most likely seen in the settings of microvascular ischemic changes. Mild in extent. 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 Otherwise normal appearance of brain parenchyma. Ventricles are normal. Cerebral volume is age appropriate. Vascular: No hyperdense vessel or unexpected calcification. Intracranial arteriosclerosis. Skull: Normal. Negative for fracture or focal lesion. Sinuses/Orbits: No acute finding. There is moderate mucoperiosteal thickening in the right maxillary sinus and mild mucoperiosteal thickening in the bilateral ethmoidal air cells, bilateral frontal sinus and right chamber of the sphenoid sinus. Other: Visualized mastoid air cells are unremarkable. No mastoid effusion. CT CERVICAL SPINE FINDINGS Alignment: There is straightening of cervical lordosis, which may be positional or due to muscle spasm. This examination does not assess for ligamentous injury or stability. Skull base and vertebrae: No acute fracture. No primary bone lesion or focal pathologic process. Soft tissues and spinal canal: No prevertebral fluid or swelling. No visible canal hematoma. Disc levels: Mild-to-moderately reduced C3-4 and C4-5 intervertebral disc heights. Mildly reduced remaining intervertebral disc heights.  Mild multilevel facet arthropathy and marginal osteophyte formation. Upper chest: Paraseptal emphysematous changes noted in the visualized bilateral lung apices. There is also pleural-parenchymal disease in bilateral lung apices, right more than left. Other: None IMPRESSION: *No acute intracranial abnormality. *No acute fracture or traumatic listhesis of the cervical spine. *Multiple other nonacute observations, as described above. Electronically Signed   By: Jules Schick M.D.   On: 12/20/2022 09:28   CT Head Wo Contrast  Result Date: 12/20/2022 CLINICAL DATA:  Head trauma, minor (Age >= 65y); Neck trauma (Age >= 65y) EXAM: CT HEAD WITHOUT CONTRAST CT CERVICAL SPINE WITHOUT CONTRAST TECHNIQUE: Multidetector CT imaging of the head and cervical spine was performed following the standard protocol without intravenous contrast. Multiplanar CT image reconstructions of the cervical spine were also generated. RADIATION DOSE REDUCTION: This exam was performed according to the departmental dose-optimization program which includes automated exposure control, adjustment of the mA and/or kV according to patient size and/or use of iterative reconstruction technique. COMPARISON:  CT scan head from 03/19/2022. FINDINGS: CT HEAD FINDINGS  Brain: No evidence of acute infarction, hemorrhage, hydrocephalus, extra-axial collection or mass lesion/mass effect. There is bilateral periventricular hypodensity, which is non-specific but most likely seen in the settings of microvascular ischemic changes. Mild in extent. 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 Otherwise normal appearance of brain parenchyma. Ventricles are normal. Cerebral volume is age appropriate. Vascular: No hyperdense vessel or unexpected calcification. Intracranial arteriosclerosis. Skull: Normal. Negative for fracture or focal lesion. Sinuses/Orbits: No acute finding. There is moderate mucoperiosteal thickening in the right maxillary  sinus and mild mucoperiosteal thickening in the bilateral ethmoidal air cells, bilateral frontal sinus and right chamber of the sphenoid sinus. Other: Visualized mastoid air cells are unremarkable. No mastoid effusion. CT CERVICAL SPINE FINDINGS Alignment: There is straightening of cervical lordosis, which may be positional or due to muscle spasm. This examination does not assess for ligamentous injury or stability. Skull base and vertebrae: No acute fracture. No primary bone lesion or focal pathologic process. Soft tissues and spinal canal: No prevertebral fluid or swelling. No visible canal hematoma. Disc levels: Mild-to-moderately reduced C3-4 and C4-5 intervertebral disc heights. Mildly reduced remaining intervertebral disc heights. Mild multilevel facet arthropathy and marginal osteophyte formation. Upper chest: Paraseptal emphysematous changes noted in the visualized bilateral lung apices. There is also pleural-parenchymal disease in bilateral lung apices, right more than left. Other: None IMPRESSION: *No acute intracranial abnormality. *No acute fracture or traumatic listhesis of the cervical spine. *Multiple other nonacute observations, as described above. Electronically Signed   By: Jules Schick M.D.   On: 12/20/2022 09:28   DG Femur Min 2 Views Right  Result Date: 12/20/2022 CLINICAL DATA:  Right hip and mid shaft femur pain after fall. EXAM: RIGHT FEMUR 2 VIEWS; PELVIS - 1-2 VIEW COMPARISON:  None Available. FINDINGS: There is acute comminuted, impacted and angulated intertrochanteric fracture of the right proximal femur. Pelvis is intact with normal and symmetric sacroiliac joints. No other acute fracture or dislocation. No aggressive osseous lesion. Visualized sacral arcuate lines are unremarkable. There are mild degenerative changes bilateral hip joints and right knee joints. No radiopaque foreign bodies. Vascular calcifications noted. 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 IMPRESSION: *Comminuted and angulated intertrochanteric fracture of the right proximal femur. Electronically Signed   By: Jules Schick M.D.   On: 12/20/2022 09:18   DG Pelvis 1-2 Views  Result Date: 12/20/2022 CLINICAL DATA:  Right hip and mid shaft femur pain after fall. EXAM: RIGHT FEMUR 2 VIEWS; PELVIS - 1-2 VIEW COMPARISON:  None Available. FINDINGS: There is acute comminuted, impacted and angulated intertrochanteric fracture of the right proximal femur. Pelvis is intact with normal and symmetric sacroiliac joints. No other acute fracture or dislocation. No aggressive osseous lesion. Visualized sacral arcuate lines are unremarkable. There are mild degenerative changes bilateral hip joints and right knee joints. No radiopaque foreign bodies. Vascular calcifications noted. 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 IMPRESSION: *Comminuted and angulated intertrochanteric fracture of the right proximal femur. Electronically Signed   By: Jules Schick M.D.   On: 12/20/2022 09:18    Microbiology: Results for orders placed or performed during the hospital encounter of 03/19/22  Urine Culture (for pregnant, neutropenic or urologic patients or patients with an indwelling urinary catheter)     Status: Abnormal   Collection Time: 03/19/22  8:25 PM   Specimen: Urine, Random  Result Value Ref Range Status  Specimen Description   Final    URINE, RANDOM Performed at [facility], [address], [place], [place] [address]    Special Requests   Final    NONE Performed at [facility], [address] [place], [place] [address]    Culture (A)  Final    >=100,000 COLONIES/mL ESCHERICHIA COLI Confirmed Extended Spectrum Beta-Lactamase Producer (ESBL).  In bloodstream infections from ESBL organisms, carbapenems are preferred over piperacillin/tazobactam. They are  shown to have a lower risk of mortality.    Report Status 03/22/2022 FINAL  Final   Organism ID, Bacteria ESCHERICHIA COLI (A)  Final      Susceptibility   Escherichia coli - MIC*    AMPICILLIN >=32 RESISTANT Resistant     CEFAZOLIN >=64 RESISTANT Resistant     CEFTRIAXONE 32 RESISTANT Resistant     CIPROFLOXACIN >=4 RESISTANT Resistant     GENTAMICIN <=1 SENSITIVE Sensitive     IMIPENEM <=0.25 SENSITIVE Sensitive     NITROFURANTOIN <=16 SENSITIVE Sensitive     TRIMETH/SULFA <=20 SENSITIVE Sensitive     AMPICILLIN/SULBACTAM <=2 SENSITIVE Sensitive     * >=100,000 COLONIES/mL ESCHERICHIA COLI    Labs: CBC: Recent Labs  Lab 12/20/22 0800 12/21/22 0339 12/22/22 0457 12/22/22 1507 12/23/22 0812 12/24/22 0907  WBC 7.7 7.8 5.6  --  4.1 3.3*  NEUTROABS 5.7  --   --   --   --   --   HGB 12.5 8.1* 7.1* 8.3* 8.5* 8.8*  HCT 35.8* 23.2* 20.2* 24.6* 25.2* 26.0*  MCV 94.0 94.3 95.3  --  92.0 91.9  PLT 164 159 111*  --  130* 173   Basic Metabolic Panel: Recent Labs  Lab 12/20/22 0800 12/21/22 0339 12/22/22 0456 12/24/22 0907  NA 132* 131* 132* 133*  K 3.6 4.7 4.0 4.0  CL 94* 94* 96* 96*  CO2 21* 24 25 28   GLUCOSE 115* 186* 110* 115*  BUN 11 15 15 11   CREATININE 0.76 0.97 0.81 0.60  CALCIUM 8.6* 7.8* 7.9* 8.5*  MG  --   --  1.7  --    Liver Function Tests: Recent Labs  Lab 12/20/22 0800  AST 98*  ALT 39  ALKPHOS 107  BILITOT 0.4  PROT 6.4*  ALBUMIN 3.5   CBG: No results for input(s): "GLUCAP" in the last 168 hours.  Discharge time spent: greater than 30 minutes.  Signed: Pennie Banter, DO [facility] 12/26/2022

## 2022-12-26 NOTE — Progress Notes (Signed)
  Subjective: 6 Days Post-Op Procedure(s) (LRB): OPEN REDUCTION INTERNAL FIXATION HIP (Right) Patient reports pain as mild in the right hip this morning. Patient is well this morning, confusion appears to have improved at this time. Current plan is for SNF following discharge.  She has been approved for SNF at this time. Prevena woundvac was applied to the proximal hip incision.  Mild bloody drainage noted, no additional drainage. Negative for chest pain and shortness of breath Fever: no Gastrointestinal:Negative for nausea and vomiting  Objective: Vital signs in last 24 hours: Temp:  [98 F (36.7 C)-98.1 F (36.7 C)] 98.1 F (36.7 C) (11/27 0757) Pulse Rate:  [77-82] 80 (11/27 0757) Resp:  [17-18] 17 (11/27 0757) BP: (137-138)/(68-70) 138/69 (11/27 0757) SpO2:  [99 %-100 %] 99 % (11/27 0757)  Intake/Output from previous day: No intake or output data in the 24 hours ending 12/26/22 0803    Intake/Output this shift: No intake/output data recorded.  Labs: Recent Labs    12/23/22 0812 12/24/22 0907  HGB 8.5* 8.8*   Recent Labs    12/23/22 0812 12/24/22 0907  WBC 4.1 3.3*  RBC 2.74* 2.83*  HCT 25.2* 26.0*  PLT 130* 173   Recent Labs    12/24/22 0907  NA 133*  K 4.0  CL 96*  CO2 28  BUN 11  CREATININE 0.60  GLUCOSE 115*  CALCIUM 8.5*   No results for input(s): "LABPT", "INR" in the last 72 hours.  EXAM General - Patient is Alert and Lacking Extremity - ABD soft Neurovascular intact Dorsiflexion/Plantar flexion intact No cellulitis present Compartment soft Dressing/Incision - Minimal bloody drainage to the distal incision.  Prevena intact to the proximal incision site.  Mild bloody drainage noted.  No change to drainage to the distal incision.  No additional drainage from the proximal incision is noted. Prevena was removed this morning, no active drainage is noted.  Fresh honeycomb dressings applied to the proximal and distal incisions. Motor Function -  intact, moving foot and toes well on exam.  Abdomen soft with intact bowel sounds this morning.  Past Medical History:  Diagnosis Date   Cataract    Depression     Assessment/Plan: 6 Days Post-Op Procedure(s) (LRB): OPEN REDUCTION INTERNAL FIXATION HIP (Right) Principal Problem:   Hip fracture (HCC) Active Problems:   Closed right hip fracture (HCC)  Estimated body mass index is 19.49 kg/m as calculated from the following:   Height as of this encounter: 5\' 3"  (1.6 m).   Weight as of this encounter: 49.9 kg. Advance diet Up with therapy D/C IV fluids when tolerating po intake.  Hg 8.8 this morning. Up with therapy today.  Current plan is for d/c to SNF today. She has had a BM. Prevena removed today.  Fresh honeycomb dressings applied.  Following discharge, continue with Lovenox 40mg  daily for DVT prophylaxis. Staples can be removed by SNF on 01/03/23.  Follow-up with [facility] in 6 weeks for x-rays of the right femur.  DVT Prophylaxis - TED hose and lovenox. Weight-Bearing as tolerated to right leg  J. Horris Latino, PA-C [facility] 12/26/2022, 8:03 AM

## 2022-12-26 NOTE — Progress Notes (Signed)
Physical Therapy Treatment Patient Details Name: Jill Hayes MRN: [id number] DOB: [birth year] Today's Date: 12/26/2022   History of Present Illness Pt is a 72 y.o. female presented with mechanical fall and R hip pain, now s/p R hip ORIF. PMH includes alcohol abuse, cigarette smoking, anxiety, depression, and cataracts.    PT Comments  Increased time spent with pt due to initially refusing to mobilize. With persistent encouragement and education, pt was compliant with transferring to bedside chair and positioned to comfort. Thigh high TEDs removed due to ill fitting causing "burning" sensation at rolled up portion with good relied, minimal edema noted at R LE. R hip wound vac disconnected this am. Pt continues to make slow progress as cognitive status improves and pain level is better controlled. Anticipate transition to STR once medically cleared.    If plan is discharge home, recommend the following: A lot of help with walking and/or transfers;A lot of help with bathing/dressing/bathroom;Assistance with cooking/housework;Direct supervision/assist for medications management;Direct supervision/assist for financial management;Assist for transportation;Help with stairs or ramp for entrance;Supervision due to cognitive status   Can travel by private vehicle     No  Equipment Recommendations  Other (comment) (defer to next level of care)    Recommendations for Other Services       Precautions / Restrictions Precautions Precautions: Fall Restrictions Weight Bearing Restrictions: Yes RLE Weight Bearing: Weight bearing as tolerated     Mobility  Bed Mobility Overal bed mobility: Needs Assistance Bed Mobility: Supine to Sit     Supine to sit: Min assist, HOB elevated, Used rails     General bed mobility comments: increased time due to pain    Transfers Overall transfer level: Needs assistance Equipment used: None Transfers: Sit to/from Stand Sit to Stand: Mod assist, From  elevated surface Stand pivot transfers: Mod assist         General transfer comment: Pt also able to scoot from one surface to another with Min/ModA    Ambulation/Gait               General Gait Details: poor quality steps to recliner   Stairs             Wheelchair Mobility     Tilt Bed    Modified Rankin (Stroke Patients Only)       Balance Overall balance assessment: Needs assistance Sitting-balance support: No upper extremity supported, Feet supported Sitting balance-Leahy Scale: Fair Sitting balance - Comments: pt impulsive and wavering throughout, no LOB at EOB   Standing balance support: Bilateral upper extremity supported, During functional activity Standing balance-Leahy Scale: Poor Standing balance comment: pt is high fall risk due to impulsivity and pain                            Cognition Arousal: Alert Behavior During Therapy: WFL for tasks assessed/performed, Anxious Overall Cognitive Status: Within Functional Limits for tasks assessed                                 General Comments: Cognition appears to be clearing. Pt willing to participate with education and frequest rest breaks        Exercises Total Joint Exercises Long Arc Quad: AROM, Both, 10 reps General Exercises - Lower Extremity Long Arc Quad: AROM, Both, 10 reps, Seated Other Exercises Other Exercises: Removed ill fitting thigh high TEDs due to pt  c/o burning with good releif.    General Comments General comments (skin integrity, edema, etc.): Pt educated at length importance of participating in PT and benefits with eventual compliance      Pertinent Vitals/Pain Pain Assessment Pain Assessment: 0-10 Pain Score: 6  Pain Location: R hip Pain Descriptors / Indicators: Aching, Discomfort, Burning, Guarding, Grimacing, Moaning Pain Intervention(s): Premedicated before session, Limited activity within patient's tolerance    Home Living                           Prior Function            PT Goals (current goals can now be found in the care plan section) Acute Rehab PT Goals Patient Stated Goal: Go home Progress towards PT goals: Progressing toward goals    Frequency    7X/week      PT Plan      Co-evaluation              AM-PAC PT "6 Clicks" Mobility   Outcome Measure  Help needed turning from your back to your side while in a flat bed without using bedrails?: A Little Help needed moving from lying on your back to sitting on the side of a flat bed without using bedrails?: A Lot Help needed moving to and from a bed to a chair (including a wheelchair)?: A Lot Help needed standing up from a chair using your arms (e.g., wheelchair or bedside chair)?: A Lot Help needed to walk in hospital room?: A Lot Help needed climbing 3-5 steps with a railing? : Total 6 Click Score: 12    End of Session Equipment Utilized During Treatment: Gait belt Activity Tolerance: Patient limited by pain Patient left: in chair;with call bell/phone within reach;with chair alarm set Nurse Communication: Mobility status PT Visit Diagnosis: Muscle weakness (generalized) (M62.81);Other abnormalities of gait and mobility (R26.89);Pain Pain - Right/Left: Right Pain - part of body: Hip     Time: 5284-1324 PT Time Calculation (min) (ACUTE ONLY): 44 min  Charges:    $Therapeutic Exercise: 8-22 mins $Therapeutic Activity: 23-37 mins PT General Charges $$ ACUTE PT VISIT: 1 Visit                    Zadie Cleverly, PTA  Jannet Askew 12/26/2022, 3:29 PM

## 2022-12-27 DIAGNOSIS — E871 Hypo-osmolality and hyponatremia: Secondary | ICD-10-CM | POA: Diagnosis not present

## 2022-12-27 DIAGNOSIS — F32A Depression, unspecified: Secondary | ICD-10-CM

## 2022-12-27 DIAGNOSIS — F419 Anxiety disorder, unspecified: Secondary | ICD-10-CM

## 2022-12-27 DIAGNOSIS — S72001A Fracture of unspecified part of neck of right femur, initial encounter for closed fracture: Secondary | ICD-10-CM | POA: Diagnosis not present

## 2022-12-27 NOTE — Progress Notes (Addendum)
Physical Therapy Treatment Patient Details Name: Jill Hayes MRN: [id number] DOB: Apr 27, 1950 Today's Date: 12/27/2022    History of Present Illness Pt is a 72 y.o. female presented with mechanical fall and R hip pain, now s/p R hip ORIF. PMH includes alcohol abuse, cigarette smoking, anxiety, depression, and cataracts.    PT Comments  Pt received on [facility] [address] with RN and agreed to PT session. Pt performed STS with the use of RW (2wheels) and step pivot transferred to recliner SUP. Pt needed a rest break following transfer due to fatigue. While seated in recliner, pt performed LLE exercises which included x10: knee extension, hip flexion, and hip adduction. Due to fatigue and sense of nausea, pt declined further therapy. Pt will continue to benefit from skilled PT session to improve strength, functional mobility, and activity tolerance to maximize safety/IND following D/C.    If plan is discharge home, recommend the following: A lot of help with walking and/or transfers;A lot of help with bathing/dressing/bathroom;Assistance with cooking/housework;Direct supervision/assist for medications management;Direct supervision/assist for financial management;Assist for transportation;Help with stairs or ramp for entrance;Supervision due to cognitive status   Can travel by private vehicle     No  Equipment Recommendations  Other (comment) (TBD at next facility)    Recommendations for Other Services       Precautions / Restrictions Precautions Precautions: Fall Restrictions Weight Bearing Restrictions: Yes RLE Weight Bearing: Weight bearing as tolerated     Mobility  Bed Mobility                    Transfers                        Ambulation/Gait                   Stairs             Wheelchair Mobility     Tilt Bed    Modified Rankin (Stroke Patients Only)       Balance                                            Cognition                                                 Exercises Other Exercises Other Exercises: x10 LLE: Hip flexion, knee extension, and hip adduction    General Comments         Pertinent Vitals/Pain      Home Living                          Prior Function            PT Goals (current goals can now be found in the care plan section) Acute Rehab PT Goals Patient Stated Goal: Go home PT Goal Formulation: With patient Time For Goal Achievement: 01/03/23 Potential to Achieve Goals: Good Progress towards PT goals: Progressing toward goals    Frequency    7X/week      PT Plan      Co-evaluation              AM-PAC PT "6 Clicks" Mobility  Outcome Measure  Help needed turning from your back to your side while in a flat bed without using bedrails?: A Little Help needed moving from lying on your back to sitting on the side of a flat bed without using bedrails?: A Lot Help needed moving to and from a bed to a chair (including a wheelchair)?: A Lot Help needed standing up from a chair using your arms (e.g., wheelchair or bedside chair)?: A Lot Help needed to walk in hospital room?: A Lot Help needed climbing 3-5 steps with a railing? : Total 6 Click Score: 12    End of Session Equipment Utilized During Treatment: Gait belt Activity Tolerance: Patient limited by pain;Patient limited by fatigue Patient left: in chair;with call bell/phone within reach;with chair alarm set Nurse Communication: Mobility status PT Visit Diagnosis: Muscle weakness (generalized) (M62.81);Other abnormalities of gait and mobility (R26.89);Pain Pain - Right/Left: Right Pain - part of body: Hip     Time: 1610-9604 PT Time Calculation (min) (ACUTE ONLY): 15 min  Charges:    $Therapeutic Exercise: 8-22 mins PT General Charges $$ ACUTE PT VISIT: 1 Visit                     Rosaelena Kemnitz [employer] SPT, LAT, ATC   [place]. [place] IV, PT, DPT Physical  Therapist- [place]  [facility]  12/27/2022, 2:59 PM

## 2022-12-27 NOTE — Progress Notes (Signed)
  Subjective: 7 Days Post-Op Procedure(s) (LRB): OPEN REDUCTION INTERNAL FIXATION HIP (Right) Patient reports pain as mild in the right hip this morning. Patient is well this morning, confusion appears to have improved at this time. Current plan is for SNF following discharge.  She has been approved for SNF at this time. Negative for chest pain and shortness of breath Fever: no Gastrointestinal:Negative for nausea and vomiting  Objective: Vital signs in last 24 hours: Temp:  [97.7 F (36.5 C)-98.4 F (36.9 C)] 98.3 F (36.8 C) (11/28 0830) Pulse Rate:  [77-88] 84 (11/28 0830) Resp:  [17-20] 17 (11/28 0830) BP: (114-137)/(64-91) 137/64 (11/28 0830) SpO2:  [100 %] 100 % (11/28 0830)  Intake/Output from previous day:  Intake/Output Summary (Last 24 hours) at 12/27/2022 0903 Last data filed at 12/26/2022 1024 Gross per 24 hour  Intake 120 ml  Output --  Net 120 ml      Intake/Output this shift: No intake/output data recorded.  Labs: Recent Labs    12/24/22 0907  HGB 8.8*   Recent Labs    12/24/22 0907  WBC 3.3*  RBC 2.83*  HCT 26.0*  PLT 173   Recent Labs    12/24/22 0907  NA 133*  K 4.0  CL 96*  CO2 28  BUN 11  CREATININE 0.60  GLUCOSE 115*  CALCIUM 8.5*   No results for input(s): "LABPT", "INR" in the last 72 hours.  EXAM General - Patient is Alert and Lacking Extremity - ABD soft Neurovascular intact Dorsiflexion/Plantar flexion intact No cellulitis present Compartment soft Dressing/Incision - Minimal bloody drainage to the proximal incision, no drainage noted to the distal incision. Motor Function - intact, moving foot and toes well on exam.  Abdomen soft with intact bowel sounds this morning.  Past Medical History:  Diagnosis Date   Cataract    Depression     Assessment/Plan: 7 Days Post-Op Procedure(s) (LRB): OPEN REDUCTION INTERNAL FIXATION HIP (Right) Principal Problem:   Closed right hip fracture (HCC) Active Problems:    Hyponatremia   Severe protein-calorie malnutrition (HCC)   Anxiety and depression   History of smoking greater than 50 pack years  Estimated body mass index is 19.49 kg/m as calculated from the following:   Height as of this encounter: 5\' 3"  (1.6 m).   Weight as of this encounter: 49.9 kg. Advance diet Up with therapy D/C IV fluids when tolerating po intake.  Labs reviewed. Patient has had a BM. Cleared for discharge to SNF.  Following discharge, continue with Lovenox 40mg  daily for DVT prophylaxis. Staples can be removed by SNF on 01/03/23.  Follow-up with [facility] in 6 weeks for x-rays of the right femur.  DVT Prophylaxis - TED hose and lovenox. Weight-Bearing as tolerated to right leg  J. Horris Latino, PA-C [facility] Orthopaedic Surgery 12/27/2022, 9:03 AM

## 2022-12-27 NOTE — Progress Notes (Signed)
Progress Note   Patient: Jill Hayes ZOX:[id number] DOB: 27-Sep-1950 DOA: 12/20/2022     7 DOS: the patient was seen and examined on 12/27/2022   Brief hospital course:  HPI on admission:  "Jill Hayes is a 72 y.o. female with medical history significant of alcohol abuse, cigarette smoking, presented with mechanical fall and right hip pain.   Patient woke up this morning and going to kitchen but " legs gave up" and fell on the right side and hit right hip.  Denies any chest pain shortness of breath lightheadedness or any other prodromes and no LOC, no head injury.  She was able to sit up and then stand up on her own right after the fall and walk back to bathroom however started to feel extruding pain of the right hip and called ambulance.  At baseline, active, able to walk 15-20 minutes without shortness of breath.   ED Course: Afebrile, blood pressure elevated, nonhypoxic.  CT right hip showed comminuted impacted and displaced intertrochanteric fracture of proximal right femur.  Other trauma scan including CT head and neck negative for acute findings."   Pt was admitted to hospital with Orthopedic surgery consulted.   She underwent ORIF on 12/20/2022 with Dr. Wilkie Aye.   Further hospital course and management as outlined below.   11/28 -- d/c to SNF yesterday was cancelled as pt had gone several days with no BM.   Assessment and Plan:  Right femoral intertrochanteric fracture Secondary to mechanical fall.   Denied any symptoms of prodromes of syncope and near syncope. Status post ORIF of right hip 12/20/2022 --Continue pain control --Bowel regimen --Continue physical therapy at SNF   History of alcohol abuse Alcohol withdrawal symptoms - mild, resolved Mentation now baseline, mild confusion appears resolved Monitored on CIWA protocol - did not require medication    Severe malnutrition related to chronic illness Related to chronic illness, alcohol abuse as evidenced by  moderate to severe fat depletion and moderate to severe muscle depletion --Liberalize diet to regular for widest variety of meal selections --Ensure Enlive po TID, each supplement provides 350 kcal and 20 grams of protein.  --Continue multivitamin, folic acid & thiamine daily   Postop Anemia / ABLA Noted to have a drop in her H&H from 12.5 >> 7.1 (transfused 1 unit pRBC's on 11/26) Hbg improved  >> 8.3 >> 8.5 >> 8.8 No evidence of active bleeding. --Repeat CBC in 1 week --Monitor closely while on Lovenox    Cigarette smoking -Cessation education performed at bedside -Nicotine patch at 21 mg daily    Hyponatremia - mild Na 133  Most likely secondary to poor oral intake and alcohol abuse --Encourage oral intake --Repeat BMP in 1-2 weeks  Constipation - resolved with aggressive bowel regimen 11/27 --Continue bowel regimen, hold if having loose or frequent stools      Subjective: Pt reports being "disgusted", and expresses frustration about tele-sitter monitoring her and telling her not to get up every time she moves.  She reports mild pain, overall meds contorlling pain. No other complaints   Physical Exam: Vitals:   12/26/22 2127 12/26/22 2128 12/26/22 2147 12/27/22 0830  BP: (!) 127/91 (!) 127/91 (!) 127/91 137/64  Pulse: 88 88 88 84  Resp: 20 20  17   Temp: 97.7 F (36.5 C) 97.7 F (36.5 C)  98.3 F (36.8 C)  TempSrc: Oral Oral  Oral  SpO2: 100% 100%  100%  Weight:      Height:  General exam: awake, alert, no acute distress HEENT: moist mucus membranes, hearing grossly normal  Respiratory system: CTAB, no wheezes, rales or rhonchi, normal respiratory effort. Cardiovascular system: normal S1/S2,  RRR, no pedal edema.   Gastrointestinal system: soft, NT, ND, no HSM felt, +bowel sounds. Central nervous system: A&O x 3. no gross focal neurologic deficits, normal speech Extremities: moves all, no edema, normal tone Skin: dry, intact, normal temperature Psychiatry:  normal mood, congruent affect, judgement and insight appear normal   Data Reviewed:  No new labs today  Family Communication: None  Disposition: Status is: Inpatient Remains inpatient appropriate because: awaiting SNF d/c   Planned Discharge Destination: Skilled nursing facility    Time spent: 35 minutes  Author: Pennie Banter, DO 12/27/2022 2:35 PM  For on call review [email or web address].

## 2022-12-28 DIAGNOSIS — E43 Unspecified severe protein-calorie malnutrition: Secondary | ICD-10-CM | POA: Diagnosis not present

## 2022-12-28 DIAGNOSIS — F32A Depression, unspecified: Secondary | ICD-10-CM | POA: Diagnosis not present

## 2022-12-28 DIAGNOSIS — F419 Anxiety disorder, unspecified: Secondary | ICD-10-CM | POA: Diagnosis not present

## 2022-12-28 NOTE — TOC Progression Note (Signed)
Transition of Care [facility]) - Progression Note    Patient Details  Name: Jill Hayes MRN: [id number] Date of Birth: Apr 28, 1950  Transition of [facility] CM/SW Contact  Marlowe Sax, RN Phone Number: 12/28/2022, 11:26 AM  Clinical Narrative:     The patient is going to room 2 A at [facility], I called her daughter [name] and let her know, EMS Called to transport   Expected Discharge Plan: Skilled Nursing Facility Barriers to Discharge: SNF Pending bed offer, Insurance Authorization  Expected Discharge Plan and Services   Discharge Planning Services: CM Consult   Living arrangements for the past 2 months: Single Family Home Expected Discharge Date: 12/28/22                 DME Agency: NA       HH Arranged: NA           Social Determinants of Health (SDOH) Interventions SDOH Screenings   Food Insecurity: No Food Insecurity (12/20/2022)  Housing: Low Risk  (12/20/2022)  Transportation Needs: No Transportation Needs (12/20/2022)  Utilities: Not At Risk (12/20/2022)  Alcohol Screen: Low Risk  (09/18/2022)  Depression (PHQ2-9): Low Risk  (09/18/2022)  Financial Resource Strain: Low Risk  (09/18/2022)  Physical Activity: Inactive (09/18/2022)  Social Connections: Socially Isolated (09/18/2022)  Stress: No Stress Concern Present (09/18/2022)  Tobacco Use: High Risk (12/20/2022)  Health Literacy: Adequate Health Literacy (09/18/2022)    Readmission Risk Interventions     No data to display

## 2022-12-28 NOTE — Plan of Care (Signed)

## 2022-12-28 NOTE — Progress Notes (Signed)
  Subjective: 8 Days Post-Op Procedure(s) (LRB): OPEN REDUCTION INTERNAL FIXATION HIP (Right) Patient reports pain as mild in the right hip this morning. Patient is well this morning, confusion has improved. Current plan is for SNF following discharge.  She has been approved for SNF at this time. Negative for chest pain and shortness of breath Fever: no Gastrointestinal:Negative for nausea and vomiting She has had a BM.  Objective: Vital signs in last 24 hours: Temp:  [97.9 F (36.6 C)-98.2 F (36.8 C)] 98.2 F (36.8 C) (11/28 2308) Pulse Rate:  [80-85] 85 (11/28 2308) Resp:  [17-18] 17 (11/28 2308) BP: (125-149)/(67-68) 125/67 (11/28 2308) SpO2:  [98 %-100 %] 98 % (11/28 2308)  Intake/Output from previous day: No intake or output data in the 24 hours ending 12/28/22 0848     Intake/Output this shift: No intake/output data recorded.  Labs: No results for input(s): "HGB" in the last 72 hours.  No results for input(s): "WBC", "RBC", "HCT", "PLT" in the last 72 hours.  No results for input(s): "NA", "K", "CL", "CO2", "BUN", "CREATININE", "GLUCOSE", "CALCIUM" in the last 72 hours.  No results for input(s): "LABPT", "INR" in the last 72 hours.  EXAM General - Patient is Alert and Lacking Extremity - ABD soft Neurovascular intact Dorsiflexion/Plantar flexion intact No cellulitis present Compartment soft Dressing/Incision - Minimal bloody drainage to the proximal incision, no drainage noted to the distal incision. Motor Function - intact, moving foot and toes well on exam.  Abdomen soft with intact bowel sounds this morning.  Past Medical History:  Diagnosis Date   Cataract    Depression     Assessment/Plan: 8 Days Post-Op Procedure(s) (LRB): OPEN REDUCTION INTERNAL FIXATION HIP (Right) Principal Problem:   Closed right hip fracture (HCC) Active Problems:   Hyponatremia   Severe protein-calorie malnutrition (HCC)   Anxiety and depression   History of smoking  greater than 50 pack years  Estimated body mass index is 19.49 kg/m as calculated from the following:   Height as of this encounter: 5\' 3"  (1.6 m).   Weight as of this encounter: 49.9 kg. Advance diet Up with therapy D/C IV fluids when tolerating po intake.  Labs reviewed. Patient has had a BM. Cleared for discharge to SNF. Plan for discharge to SNF today.  Orthopaedics will sign off at this time.  Please re-consult if any new issues develop.  Following discharge, continue with Lovenox 40mg  daily for DVT prophylaxis. Staples can be removed by SNF on 01/03/23.  Follow-up with [facility] in 6 weeks for x-rays of the right femur.  DVT Prophylaxis - TED hose and lovenox. Weight-Bearing as tolerated to right leg  J. Horris Latino, PA-C [facility] Orth[facility] 12/28/2022, 8:48 AM

## 2022-12-28 NOTE — Plan of Care (Signed)
Patient discharged per MD orders at this time.All dc instructions,education and medications reviewed with the patient.Pt expressed understanding and will comply with dc instructions.follow up appointments was also communicated to the Pt.no verbal c/o or any ssx of distress.patient was discharged to the [facility] for STR/PT/OT services per order.report was called to staff nurse Crystal before discharge.Pt was transported by 2 [facility] personnel on a stretcher.

## 2022-12-28 NOTE — Plan of Care (Signed)

## 2023-01-15 ENCOUNTER — Telehealth: Payer: Self-pay

## 2023-01-15 NOTE — Transitions of Care (Post Inpatient/ED Visit) (Unsigned)
   01/15/2023  Name: Jill Hayes MRN: [id number] DOB: 12-12-1950  Today's TOC FU Call Status: Today's TOC FU Call Status:: Unsuccessful Call (1st Attempt) Unsuccessful Call (1st Attempt) Date: 01/15/23  Attempted to reach the patient regarding the most recent Inpatient/ED visit.  Follow Up Plan: Additional outreach attempts will be made to reach the patient to complete the Transitions of Care (Post Inpatient/ED visit) call.   Signature Karena Addison, LPN [facility] Nurse Health Advisor Direct Dial [phone]

## 2023-01-16 NOTE — Transitions of Care (Post Inpatient/ED Visit) (Signed)
01/16/2023  Name: Jill Hayes MRN: [id number] DOB: 1950-11-03  Today's TOC FU Call Status: Today's TOC FU Call Status:: Successful TOC FU Call Completed Unsuccessful Call (1st Attempt) Date: 01/15/23 [facility] FU Call Complete Date: 01/16/23 Patient's Name and Date of Birth confirmed.  Transition Care Management Follow-up Telephone Call Date of Discharge: 01/14/23 Discharge Facility: [facility]) Type of Discharge: Inpatient Admission Primary Inpatient Discharge Diagnosis:: fracture right fermur How have you been since you were released from the hospital?: Better Any questions or concerns?: No  Items Reviewed: Medications obtained,verified, and reconciled?: Yes (Medications Reviewed) Any new allergies since your discharge?: No Dietary orders reviewed?: Yes Do you have support at home?: No  Medications Reviewed Today: Medications Reviewed Today     Reviewed by Karena Addison, LPN (Licensed Practical Nurse) on 01/16/23 at 1309  Med List Status: <None>   Medication Order Taking? Sig Documenting Provider Last Dose Status Informant  acetaminophen (TYLENOL) 500 MG tablet [id number] No Take 500-1,000 mg by mouth every 6 (six) hours as needed for mild pain or moderate pain. [provider] PRN PRN Active Self  bisacodyl (DULCOLAX) 10 MG suppository [id number]  Place 1 suppository (10 mg total) rectally daily as needed (if no BM in 48 hours depsite Miralax & Colace). Esaw Grandchild A, DO  Active   docusate sodium (COLACE) 100 MG capsule [id number]  Take 1 capsule (100 mg total) by mouth 2 (two) times daily. Esaw Grandchild A, DO  Active   enoxaparin (LOVENOX) 40 MG/0.4ML injection [id number]  Inject 0.4 mLs (40 mg total) into the skin daily. Anson Oregon, PA-C  Active   feeding supplement (ENSURE ENLIVE / ENSURE PLUS) LIQD [id number]  Take 237 mLs by mouth 3 (three) times daily between meals. Pennie Banter, DO  Active   folic acid (FOLVITE) 1 MG  tablet [id number]  Take 1 tablet (1 mg total) by mouth daily. Pennie Banter, DO  Active   HYDROcodone-acetaminophen (NORCO/VICODIN) 5-325 MG tablet [id number]  Take 1 tablet by mouth every 6 (six) hours as needed for moderate pain (pain score 4-6). Anson Oregon, PA-C  Active   ibuprofen (ADVIL) 200 MG tablet [id number] No Take 400-600 mg by mouth every 6 (six) hours as needed for mild pain or moderate pain. [provider] PRN PRN Active Self  Magnesium 400 MG CAPS [id number] No Take 2 capsules by mouth 1 day or 1 dose. [provider] Past Week Active Self  metoprolol tartrate (LOPRESSOR) 25 MG tablet [id number]  Take 0.5 tablets (12.5 mg total) by mouth 2 (two) times daily. Pennie Banter, DO  Active   Multiple Vitamins-Minerals (CENTRUM ADULT 50+ MULTIGUMMIES) CHEW [id number] No Chew 1 Dose by mouth 1 day or 1 dose. [provider] Past Week Active Self  nicotine (NICODERM CQ - DOSED IN MG/24 HOURS) 21 mg/24hr patch [id number]  Place 1 patch (21 mg total) onto the skin daily. Pennie Banter, DO  Active   ondansetron (ZOFRAN) 4 MG tablet [id number]  Take 1 tablet (4 mg total) by mouth every 6 (six) hours as needed for nausea. Esaw Grandchild A, DO  Active   polyethylene glycol (MIRALAX / GLYCOLAX) 17 g packet [id number]  Take 17 g by mouth daily. Esaw Grandchild A, DO  Active   thiamine (VITAMIN B-1) 100 MG tablet [id number]  Take 1 tablet (100 mg total) by mouth daily. Pennie Banter, DO  Active  Home Care and Equipment/Supplies: Were Home Health Services Ordered?: Yes [facility] Has Agency set up a time to come to your home?: No Any new equipment or medical supplies ordered?: NA  Functional Questionnaire: Do you need assistance with bathing/showering or dressing?: No Do you need assistance with meal preparation?: No Do you need assistance with eating?: No Do you have difficulty maintaining  continence: No Do you need assistance with getting out of bed/getting out of a chair/moving?: No Do you have difficulty managing or taking your medications?: No  Follow up appointments reviewed: PCP Follow-up appointment confirmed?: No (no avail appts, sent message to staff to schedule) Specialist Hospital Follow-up appointment confirmed?: No Reason Specialist Follow-Up Not Confirmed: Patient has Specialist Provider Number and will Call for Appointment Do you need transportation to your follow-up appointment?: No Do you understand care options if your condition(s) worsen?: Yes-patient verbalized understanding    SIGNATURE Karena Addison, LPN [facility] Nurse Health Advisor Direct Dial [phone]

## 2023-01-21 ENCOUNTER — Telehealth: Payer: Self-pay | Admitting: Family Medicine

## 2023-01-21 ENCOUNTER — Encounter: Payer: Self-pay | Admitting: Family Medicine

## 2023-01-21 ENCOUNTER — Ambulatory Visit: Payer: Medicare HMO | Admitting: Family Medicine

## 2023-01-21 VITALS — BP 130/70 | HR 77 | Resp 18 | Ht 63.0 in | Wt 97.6 lb

## 2023-01-21 DIAGNOSIS — S72001D Fracture of unspecified part of neck of right femur, subsequent encounter for closed fracture with routine healing: Secondary | ICD-10-CM | POA: Diagnosis not present

## 2023-01-21 DIAGNOSIS — Z8781 Personal history of (healed) traumatic fracture: Secondary | ICD-10-CM | POA: Diagnosis not present

## 2023-01-21 DIAGNOSIS — S72001A Fracture of unspecified part of neck of right femur, initial encounter for closed fracture: Secondary | ICD-10-CM

## 2023-01-21 DIAGNOSIS — D62 Acute posthemorrhagic anemia: Secondary | ICD-10-CM | POA: Diagnosis not present

## 2023-01-21 DIAGNOSIS — E876 Hypokalemia: Secondary | ICD-10-CM

## 2023-01-21 DIAGNOSIS — F1010 Alcohol abuse, uncomplicated: Secondary | ICD-10-CM

## 2023-01-21 DIAGNOSIS — E43 Unspecified severe protein-calorie malnutrition: Secondary | ICD-10-CM | POA: Diagnosis not present

## 2023-01-21 DIAGNOSIS — Z9181 History of falling: Secondary | ICD-10-CM

## 2023-01-21 DIAGNOSIS — Z720 Tobacco use: Secondary | ICD-10-CM

## 2023-01-21 MED ORDER — FOLIC ACID 1 MG PO TABS: 1.0000 mg | ORAL_TABLET | Freq: Every day | ORAL | 1 refills | Status: DC

## 2023-01-21 MED ORDER — VITAMIN B-1 100 MG PO TABS: 100.0000 mg | ORAL_TABLET | Freq: Every day | ORAL | 1 refills | Status: DC

## 2023-01-21 NOTE — Assessment & Plan Note (Signed)
Post operative anemia with hgb drop from 12 to 7.1, received 1 unit of pRBCs while hospitalized  Will repeat CBC today

## 2023-01-21 NOTE — Assessment & Plan Note (Signed)
Following up after open reduction and internal fixation of the right hip due to a fracture. Reports pelvic pain and a recent fall without loss of consciousness or significant head injury. Hemoglobin was low post-surgery, requiring a transfusion. Currently using a walker for mobility. Discussed the importance of attending the orthopedic follow-up to ensure proper healing and avoid complications. - Order repeat metabolic panel and CBC - Follow up with orthopedic surgery on Monday at 11 AM - Send folic acid prescription to [facility] - Schedule follow-up visit in 4-6 weeks

## 2023-01-21 NOTE — Assessment & Plan Note (Addendum)
BMI of 17 and weighs 83 pounds. Alcohol use has depleted folic acid and vitamin B1 levels. Currently taking magnesium and vitamins but not consistently consuming nutritional supplements. Discussed the importance of nutritional supplements and dietary counseling to improve nutritional status and support healing. - Send folic acid and thiamine prescriptions to [facility] - Recommend nutritional supplements like Boost, Ensure, or Premier Protein - Refer to a nutritionist for dietary counseling and calorie goals -CMP, mag and phos collected today

## 2023-01-21 NOTE — Telephone Encounter (Signed)
Maria calling from [facility] is calling to request verbal orders for [facility]  Frequency- ! W 2, 2 W4  Pt is needing a 3 in 1 commode, to preferred DME Pt had a fall Thursday with no injuries.   CB- [phone] Verbal ok on VM

## 2023-01-21 NOTE — Telephone Encounter (Signed)
Ok for verbal orders.    Ordered 3-in-1 commode for patient's hip fracture and high fall risk   Ronnald Ramp, MD  [facility]

## 2023-01-21 NOTE — Progress Notes (Signed)
Established patient visit   Patient: Jill Hayes   DOB: 1950-08-13   72 y.o. Female  MRN: [id number] Visit Date: 01/21/2023  Today's healthcare provider: Ronnald Ramp, MD   Chief Complaint  Patient presents with   Hospitalization Follow-up   Subjective       Discussed the use of AI scribe software for clinical note transcription with the patient, who gave verbal consent to proceed.  History of Present Illness   The patient, with a history of right hip fracture managed with open reduction and internal fixation, presents for follow-up. She reports not feeling well on the day of the visit, attributing this to being on her feet a lot recently. She also mentions a recent fall, which has resulted in pelvic pain. The patient was discharged from a skilled nursing facility (SNF) after an admission period and has been managing at home since then.  The patient describes an incident where she felt sick after standing up and subsequently fell, with the walker landing on top of her. This fall occurred two days prior to the consultation. She denies any significant head injury or loss of consciousness during the fall.  The patient has been managing her mobility with a walker, both a standard and a rolling type with a seat. She reports some numbness and occasional pain in one area of her leg, but the primary source of discomfort is her lower back, a pre-existing issue.  The patient has a history of alcohol use but reports complete abstinence currently. She has been taking metoprolol, Lovenox injections, and nicotine, but has stopped the nicotine and is no longer on Lovenox injections. She also takes Tylenol and ibuprofen for pain management.  The patient's appetite is described as fair, and she reports eating at least two meals a day. She has been taking vitamins and magnesium at home but has not been consuming many feeding supplements like Ensure. The patient's weight has been  fluctuating, with a recent decrease noted.  The patient is a smoker and has returned to smoking despite advice on its potential impact on healing. She is due for a follow-up with orthopedic surgery regarding her hip operation.     TOC call completed on 01/15/23 Admitted 12/20/22-12/28/22 for right hip fracture and severe protein calorie malnutrition   CT on presentation to ED showed comminuted impacted and displaced intertrochanteric fracture of proximal right femur   ORIF of right hip 12/20/2022   Postoperative anemia, dropped hgb to 7 from 12, improved to above 8 prior to discharge    SNF from     Past Medical History:  Diagnosis Date   Cataract    Depression     Medications: Outpatient Medications Prior to Visit  Medication Sig   acetaminophen (TYLENOL) 500 MG tablet Take 500-1,000 mg by mouth every 6 (six) hours as needed for mild pain or moderate pain.   feeding supplement (ENSURE ENLIVE / ENSURE PLUS) LIQD Take 237 mLs by mouth 3 (three) times daily between meals.   ibuprofen (ADVIL) 200 MG tablet Take 400-600 mg by mouth every 6 (six) hours as needed for mild pain or moderate pain.   Magnesium 400 MG CAPS Take 2 capsules by mouth 1 day or 1 dose.   Multiple Vitamins-Minerals (CENTRUM ADULT 50+ MULTIGUMMIES) CHEW Chew 1 Dose by mouth 1 day or 1 dose.   bisacodyl (DULCOLAX) 10 MG suppository Place 1 suppository (10 mg total) rectally daily as needed (if no BM in 48 hours depsite  Miralax & Colace). (Patient not taking: Reported on 01/21/2023)   docusate sodium (COLACE) 100 MG capsule Take 1 capsule (100 mg total) by mouth 2 (two) times daily. (Patient not taking: Reported on 01/21/2023)   HYDROcodone-acetaminophen (NORCO/VICODIN) 5-325 MG tablet Take 1 tablet by mouth every 6 (six) hours as needed for moderate pain (pain score 4-6). (Patient not taking: Reported on 01/21/2023)   metoprolol tartrate (LOPRESSOR) 25 MG tablet Take 0.5 tablets (12.5 mg total) by mouth 2 (two) times  daily. (Patient not taking: Reported on 01/21/2023)   nicotine (NICODERM CQ - DOSED IN MG/24 HOURS) 21 mg/24hr patch Place 1 patch (21 mg total) onto the skin daily. (Patient not taking: Reported on 01/21/2023)   ondansetron (ZOFRAN) 4 MG tablet Take 1 tablet (4 mg total) by mouth every 6 (six) hours as needed for nausea. (Patient not taking: Reported on 01/21/2023)   polyethylene glycol (MIRALAX / GLYCOLAX) 17 g packet Take 17 g by mouth daily. (Patient not taking: Reported on 01/21/2023)   [DISCONTINUED] enoxaparin (LOVENOX) 40 MG/0.4ML injection Inject 0.4 mLs (40 mg total) into the skin daily. (Patient not taking: Reported on 01/21/2023)   [DISCONTINUED] folic acid (FOLVITE) 1 MG tablet Take 1 tablet (1 mg total) by mouth daily. (Patient not taking: Reported on 01/21/2023)   [DISCONTINUED] thiamine (VITAMIN B-1) 100 MG tablet Take 1 tablet (100 mg total) by mouth daily. (Patient not taking: Reported on 01/21/2023)   No facility-administered medications prior to visit.    Review of Systems  Last CBC Lab Results  Component Value Date   WBC 3.3 (L) 12/24/2022   HGB 8.8 (L) 12/24/2022   HCT 26.0 (L) 12/24/2022   MCV 91.9 12/24/2022   MCH 31.1 12/24/2022   RDW 17.2 (H) 12/24/2022   PLT 173 12/24/2022   Last metabolic panel Lab Results  Component Value Date   GLUCOSE 115 (H) 12/24/2022   NA 133 (L) 12/24/2022   K 4.0 12/24/2022   CL 96 (L) 12/24/2022   CO2 28 12/24/2022   BUN 11 12/24/2022   CREATININE 0.60 12/24/2022   GFRNONAA >60 12/24/2022   CALCIUM 8.5 (L) 12/24/2022   PHOS 4.4 (H) 05/04/2022   PROT 6.4 (L) 12/20/2022   ALBUMIN 3.5 12/20/2022   LABGLOB 3.2 05/04/2022   AGRATIO 1.4 05/04/2022   BILITOT 0.4 12/20/2022   ALKPHOS 107 12/20/2022   AST 98 (H) 12/20/2022   ALT 39 12/20/2022   ANIONGAP 9 12/24/2022        Objective    BP 130/70   Pulse 77   Resp 18   Ht 5\' 3"  (1.6 m)   Wt 97 lb 9.6 oz (44.3 kg)   SpO2 94%   BMI 17.29 kg/m  BP Readings from Last 3  Encounters:  01/21/23 130/70  12/28/22 135/61  05/04/22 (!) 118/58   Wt Readings from Last 3 Encounters:  01/21/23 97 lb 9.6 oz (44.3 kg)  12/20/22 110 lb 0.2 oz (49.9 kg)  09/18/22 99 lb 4.8 oz (45 kg)        Physical Exam Constitutional:      General: She is not in acute distress.    Comments: Muscle wasting in face and extremities, BMI 17,chronically ill appearing, in NAD, seated in wheelchair in exam room   Cardiovascular:     Rate and Rhythm: Normal rate and regular rhythm.  Pulmonary:     Effort: Pulmonary effort is normal. No respiratory distress.     Breath sounds: No wheezing or rales.  No results found for any visits on 01/21/23.  Assessment & Plan     Problem List Items Addressed This Visit       Other   Tobacco use   Chronic  Patient has resumed smoking tobacco daily  She is in pre-contemplative stage regarding smoking cessation  Patient counseled on tobacco cessation for three mins during today's encounter, recommended cessation for overall health and healing s/p surgery for hip fracture       Severe protein-calorie malnutrition (HCC) - Primary   BMI of 17 and weighs 83 pounds. Alcohol use has depleted folic acid and vitamin B1 levels. Currently taking magnesium and vitamins but not consistently consuming nutritional supplements. Discussed the importance of nutritional supplements and dietary counseling to improve nutritional status and support healing. - Send folic acid and thiamine prescriptions to [facility] - Recommend nutritional supplements like Boost, Ensure, or Premier Protein - Refer to a nutritionist for dietary counseling and calorie goals -CMP, mag and phos collected today      Relevant Medications   folic acid (FOLVITE) 1 MG tablet   thiamine (VITAMIN B-1) 100 MG tablet   Other Relevant Orders   Magnesium   Amb ref to Medical Nutrition Therapy-MNT   Comprehensive metabolic panel   Phosphorus   S/P right hip fracture   Post  operative anemia with hgb drop from 12 to 7.1, received 1 unit of pRBCs while hospitalized  Will repeat CBC today       Alcohol abuse (Chronic)   Reports chronic lower back pain, exacerbated by recent events. Currently managing pain with acetaminophen and ibuprofen. Discussed the importance of pain management and potential need for further intervention if pain persists. - Continue current pain management with acetaminophen and ibuprofen - Monitor pain levels and consider further intervention if pain persists      Relevant Medications   folic acid (FOLVITE) 1 MG tablet   thiamine (VITAMIN B-1) 100 MG tablet   Other Relevant Orders   Comprehensive metabolic panel   Other Visit Diagnoses       Acute blood loss anemia (ABLA)       Relevant Medications   folic acid (FOLVITE) 1 MG tablet   Other Relevant Orders   CBC     Hypokalemia         At high risk for falls         Hypomagnesemia             Pelvic Pain Reports pelvic pain following a recent fall. Pain is described as sore but not severe. No significant head injury or loss of consciousness during the fall. Discussed the importance of monitoring pain and seeking further evaluation if it persists. -recommend continued use of walker while ambulating at home  -pt has follow up scheduled with orthopedics on 01/28/23  -continue PRN use of NSAIDS and Tylenol for pain management    Blood pressure elevated  Blood pressure is elevated. Currently on metoprolol 12.5 mg twice a day. Discussed the importance of accurate blood pressure measurement and potential need for medication adjustment. - Check blood pressure with a smaller cuff, improved to normal range  - Monitor blood pressure and adjust medication if necessary  General Health Maintenance Advised to avoid smoking to aid in healing. Reports current abstinence from alcohol. Discussed the importance of smoking cessation and continued abstinence from alcohol to support overall  health and healing. - Encourage smoking cessation - Continue to abstain from alcohol      Return in  about 6 weeks (around 03/04/2023) for weight, hip fracture f/u .         Ronnald Ramp, MD  [facility] [phone] (phone) [phone] (fax)  [facility]

## 2023-01-21 NOTE — Assessment & Plan Note (Signed)
Chronic  Patient has resumed smoking tobacco daily  She is in pre-contemplative stage regarding smoking cessation  Patient counseled on tobacco cessation for three mins during today's encounter, recommended cessation for overall health and healing s/p surgery for hip fracture

## 2023-01-21 NOTE — Assessment & Plan Note (Signed)
Reports chronic lower back pain, exacerbated by recent events. Currently managing pain with acetaminophen and ibuprofen. Discussed the importance of pain management and potential need for further intervention if pain persists. - Continue current pain management with acetaminophen and ibuprofen - Monitor pain levels and consider further intervention if pain persists

## 2023-01-21 NOTE — Patient Instructions (Addendum)
Jill Hayes, [facility] NPI: [id number] Jill Hayes [place] [address]   Phone: [phone]  You are currently scheduled for 11:00AM on 01/28/23 to follow you for your hip   VISIT SUMMARY:  During today's visit, we discussed your recent fall and the resulting pelvic pain, as well as your ongoing recovery from your right hip fracture surgery. We also reviewed your nutritional status, blood pressure, and chronic lower back pain. Additionally, we talked about the importance of smoking cessation and maintaining abstinence from alcohol to support your overall health and healing.  YOUR PLAN:  -RIGHT HIP FRACTURE STATUS POST OPEN REDUCTION AND INTERNAL FIXATION: This refers to the surgical repair of your right hip fracture. It's important to follow up with orthopedic surgery to ensure proper healing and avoid complications. We will also repeat your metabolic panel and complete blood count (CBC) to monitor your recovery. A prescription for folic acid has been sent to [facility], and we will schedule a follow-up visit in 4-6 weeks.  -PELVIC PAIN: Pelvic pain can occur after a fall. We evaluated your pain during the physical exam and will consider further imaging if the pain persists. Please monitor your pain and seek further evaluation if it does not improve.  -SEVERE PROTEIN MALNUTRITION: This means your body is not getting enough protein and other nutrients, which can affect your healing. We have sent prescriptions for folic acid and thiamine to [facility]. It's important to take nutritional supplements like Boost, Ensure, or Premier Protein, and we will refer you to a nutritionist for dietary counseling and calorie goals.  -HYPERTENSION: Hypertension is high blood pressure. We discussed the importance of accurate blood pressure measurement and may need to adjust your medication. We will check your blood pressure with a smaller cuff and monitor it closely.  -CHRONIC  LOWER BACK PAIN: This refers to ongoing pain in your lower back. You are currently managing it with acetaminophen and ibuprofen. We will continue with this pain management plan and consider further intervention if the pain persists.  -GENERAL HEALTH MAINTENANCE: We discussed the importance of avoiding smoking to aid in healing and maintaining your current abstinence from alcohol. These lifestyle changes are crucial for your overall health and recovery.  INSTRUCTIONS:  Please follow up with orthopedic surgery on Monday at 11 AM. We will schedule a follow-up visit in 4-6 weeks. Ensure you receive the lab slip for your blood tests, including a repeat metabolic panel and CBC.

## 2023-01-22 LAB — COMPREHENSIVE METABOLIC PANEL
ALT: 18 [IU]/L (ref 0–32)
AST: 28 [IU]/L (ref 0–40)
Albumin: 4.1 g/dL (ref 3.8–4.8)
Alkaline Phosphatase: 231 [IU]/L — ABNORMAL HIGH (ref 44–121)
BUN/Creatinine Ratio: 17 (ref 12–28)
BUN: 14 mg/dL (ref 8–27)
Bilirubin Total: 0.4 mg/dL (ref 0.0–1.2)
CO2: 25 mmol/L (ref 20–29)
Calcium: 10.1 mg/dL (ref 8.7–10.3)
Chloride: 96 mmol/L (ref 96–106)
Creatinine, Ser: 0.82 mg/dL (ref 0.57–1.00)
Globulin, Total: 3.1 g/dL (ref 1.5–4.5)
Glucose: 101 mg/dL — ABNORMAL HIGH (ref 70–99)
Potassium: 4.1 mmol/L (ref 3.5–5.2)
Sodium: 137 mmol/L (ref 134–144)
Total Protein: 7.2 g/dL (ref 6.0–8.5)
eGFR: 75 mL/min/{1.73_m2} (ref >59)

## 2023-01-22 LAB — MAGNESIUM: Magnesium: 2 mg/dL (ref 1.6–2.3)

## 2023-01-22 LAB — CBC
Hematocrit: 34.8 % (ref 34.0–46.6)
Hemoglobin: 11.5 g/dL (ref 11.1–15.9)
MCH: 31.3 pg (ref 26.6–33.0)
MCHC: 33 g/dL (ref 31.5–35.7)
MCV: 95 fL (ref 79–97)
Platelets: 312 10*3/uL (ref 150–450)
RBC: 3.67 x10E6/uL — ABNORMAL LOW (ref 3.77–5.28)
RDW: 13.5 % (ref 11.7–15.4)
WBC: 5.9 10*3/uL (ref 3.4–10.8)

## 2023-01-22 LAB — PHOSPHORUS: Phosphorus: 4 mg/dL (ref 3.0–4.3)

## 2023-01-24 ENCOUNTER — Telehealth: Payer: Self-pay

## 2023-01-24 NOTE — Telephone Encounter (Signed)
Jill Hayes with [facility] is calling in to check on the status for her request of orders. Please follow up with Byrd Hesselbach.

## 2023-01-24 NOTE — Telephone Encounter (Signed)
Copied from CRM [id number]. Topic: General - Other >> Jan 22, 2023  9:04 AM Phill Myron wrote: Home Health Verbal Orders - Caller/Agency:  Byrd Hesselbach with [facility] Number: [phone] Service Requested: Physical Therapy Frequency: 1w3 and 2w4  Any new concerns about the patient? Yes Patient is in need of a 3 in 1 commode

## 2023-01-25 NOTE — Telephone Encounter (Signed)
Advised 

## 2023-01-25 NOTE — Telephone Encounter (Signed)
Verbals given. Community message sent in regards to DME order placed on 01/21/23

## 2023-01-31 ENCOUNTER — Telehealth: Payer: Self-pay

## 2023-01-31 NOTE — Telephone Encounter (Signed)
 Copied from CRM [id number]. Topic: General - Other >> Jan 29, 2023  4:15 PM Everette C wrote: Reason for CRM: Tiffany with [facility] has called to notify the practice of a missed PT appt for the patient   The patient will be seen 02/05/23  Please contact the patient further when possible

## 2023-02-06 ENCOUNTER — Telehealth: Payer: Self-pay

## 2023-02-06 NOTE — Telephone Encounter (Signed)
Ok for verbal orders.    Andreya Lacks Simmons-Robinson, MD  [facility]  

## 2023-02-06 NOTE — Telephone Encounter (Signed)
 Copied from CRM [id number]. Topic: General - Other >> Feb 06, 2023 11:13 AM Yvone Marda Blow wrote: Home Health Verbal Orders - Caller/Agency: Shireen with [facility] Callback Number: [phone]  (confidential vox mail)  Service Requested: Occupational Therapy Frequency: 1w6 Any new concerns about the patient? No

## 2023-02-07 ENCOUNTER — Telehealth: Payer: Self-pay | Admitting: Family Medicine

## 2023-02-07 NOTE — Telephone Encounter (Signed)
 Home health certification paperwork received today and front office portion completed and entered in provider mailbox VM

## 2023-02-07 NOTE — Telephone Encounter (Signed)
 Left voice message with verbals ok

## 2023-02-08 DIAGNOSIS — I10 Essential (primary) hypertension: Secondary | ICD-10-CM | POA: Diagnosis not present

## 2023-02-08 DIAGNOSIS — Z8673 Personal history of transient ischemic attack (TIA), and cerebral infarction without residual deficits: Secondary | ICD-10-CM

## 2023-02-08 DIAGNOSIS — F1010 Alcohol abuse, uncomplicated: Secondary | ICD-10-CM

## 2023-02-08 DIAGNOSIS — F329 Major depressive disorder, single episode, unspecified: Secondary | ICD-10-CM | POA: Diagnosis not present

## 2023-02-08 DIAGNOSIS — F419 Anxiety disorder, unspecified: Secondary | ICD-10-CM

## 2023-02-08 DIAGNOSIS — R32 Unspecified urinary incontinence: Secondary | ICD-10-CM

## 2023-02-08 DIAGNOSIS — E43 Unspecified severe protein-calorie malnutrition: Secondary | ICD-10-CM

## 2023-02-08 DIAGNOSIS — R197 Diarrhea, unspecified: Secondary | ICD-10-CM

## 2023-02-08 DIAGNOSIS — S72141D Displaced intertrochanteric fracture of right femur, subsequent encounter for closed fracture with routine healing: Secondary | ICD-10-CM | POA: Diagnosis not present

## 2023-02-08 DIAGNOSIS — D649 Anemia, unspecified: Secondary | ICD-10-CM | POA: Diagnosis not present

## 2023-02-08 DIAGNOSIS — E871 Hypo-osmolality and hyponatremia: Secondary | ICD-10-CM

## 2023-02-08 NOTE — Telephone Encounter (Signed)
 Provider completed paperwork and returned it 02/08/23 it has been faxed back to home health agency VM

## 2023-03-05 ENCOUNTER — Ambulatory Visit: Payer: Self-pay | Admitting: Family Medicine

## 2023-03-06 ENCOUNTER — Telehealth: Payer: Self-pay | Admitting: Family Medicine

## 2023-03-06 NOTE — Telephone Encounter (Signed)
 Contacted number [phone] to hold peer to peer   I was informed by that the deadline to schedule the peer to peer was 03/05/23 by DAL Browning Z stated that a denial decision letter was faxed yesterday 03/05/23 at 1639 and that I could follow the appeal instructions on the denial letter once received   Confirmed with Browning that I was not able to review the faxed request until this morning 03/06/23 so would not have been able to meet the deadline   I have not received the denial letter as of yet but will review once received   Rockie Agent, MD

## 2023-03-21 ENCOUNTER — Ambulatory Visit: Payer: Medicare HMO | Admitting: Family Medicine

## 2023-04-04 ENCOUNTER — Encounter: Payer: Self-pay | Admitting: Family Medicine

## 2023-04-04 ENCOUNTER — Ambulatory Visit: Payer: Medicare HMO | Admitting: Family Medicine

## 2023-04-04 VITALS — BP 164/71 | HR 84 | Ht 63.0 in | Wt 98.0 lb

## 2023-04-04 DIAGNOSIS — Z720 Tobacco use: Secondary | ICD-10-CM

## 2023-04-04 DIAGNOSIS — I10 Essential (primary) hypertension: Secondary | ICD-10-CM

## 2023-04-04 DIAGNOSIS — F1010 Alcohol abuse, uncomplicated: Secondary | ICD-10-CM

## 2023-04-04 DIAGNOSIS — J3089 Other allergic rhinitis: Secondary | ICD-10-CM

## 2023-04-04 DIAGNOSIS — E43 Unspecified severe protein-calorie malnutrition: Secondary | ICD-10-CM

## 2023-04-04 DIAGNOSIS — Z8781 Personal history of (healed) traumatic fracture: Secondary | ICD-10-CM

## 2023-04-04 MED ORDER — AMLODIPINE BESYLATE 2.5 MG PO TABS
2.5000 mg | ORAL_TABLET | Freq: Every day | ORAL | 1 refills | Status: DC
Start: 2023-04-04 — End: 2023-09-29

## 2023-04-04 MED ORDER — METOPROLOL TARTRATE 25 MG PO TABS: 12.5000 mg | ORAL_TABLET | Freq: Two times a day (BID) | ORAL | 1 refills | Status: DC

## 2023-04-04 MED ORDER — CETIRIZINE HCL 10 MG PO TABS: 10.0000 mg | ORAL_TABLET | Freq: Every day | ORAL | 11 refills | Status: DC

## 2023-04-04 NOTE — Patient Instructions (Signed)
 VISIT SUMMARY:  Today, we discussed your recovery from a right hip fracture, your recent dizziness and sinus problems, elevated blood pressure, concerns about transient ischemic attacks (TIAs), protein-calorie malnutrition, and smoking cessation efforts. We also reviewed general health maintenance and emphasized the importance of regular monitoring and follow-up.  YOUR PLAN:  -HYPERTENSION: Hypertension means high blood pressure, which can increase the risk of dizziness, headaches, stroke, or heart attack. We prescribed amlodipine 2.5 mg daily and instructed you to monitor your blood pressure at home. If your blood pressure remains over 150/90 after one week, start taking metoprolol 12.5 mg twice daily. We also referred you to a pharmacist for blood pressure management support and scheduled a follow-up in two weeks to reassess your blood pressure and medication efficacy.  -DIZZINESS AND SINUS PROBLEMS: Your dizziness and sinus problems are likely due to seasonal allergies, but hypertension could also be a factor. We will monitor your blood pressure and manage your hypertension as planned. If your sinus symptoms persist, we may consider allergy management.  -TRANSIENT ISCHEMIC ATTACKS (TIAS): TIAs are temporary periods of reduced blood flow to the brain, which can cause stroke-like symptoms. You have not experienced any TIA symptoms since your hip fracture, and previous imaging showed no stroke or cervical spine issues. We will continue to monitor for any new symptoms.  -RIGHT HIP FRACTURE: You are recovering well from your right hip fracture, and your leg is improving. Your pain is well managed with ibuprofen. Continue taking ibuprofen for pain management and follow up with orthopedic surgery in April.  -PROTEIN-CALORIE MALNUTRITION: Protein-calorie malnutrition means your body is not getting enough nutrients, which can affect your overall health. You have gained one pound since the last visit.  Continue taking folic acid and thiamine supplements and try to consume nutritional drinks like Boost or Ensure daily. We will monitor your weight and nutritional intake.  -SMOKING CESSATION: You have made progress in reducing your smoking from two packs a day to about one pack a day. Continue your efforts to reduce smoking further and aim for cessation.  -GENERAL HEALTH MAINTENANCE: You declined colon cancer screening, and we respect your decision. However, we emphasized the importance of regular health maintenance. Please continue to follow a healthy lifestyle and keep up with routine check-ups.  INSTRUCTIONS:  Please monitor your blood pressure at home and start taking metoprolol 12.5 mg twice daily if your blood pressure remains over 150/90 after one week. Follow up with [place] in two weeks to reassess your blood pressure and medication efficacy. Continue taking ibuprofen for pain management and follow up with orthopedic surgery in April. Keep taking your folic acid and thiamine supplements, and try to consume nutritional drinks like Boost or Ensure daily. Continue your efforts to reduce smoking and aim for cessation.

## 2023-04-04 NOTE — Progress Notes (Signed)
 Established patient visit   Patient: Jill Hayes   DOB: Feb 17, 1950   73 y.o. Female  MRN: [id number] Visit Date: 04/04/2023  Today's healthcare provider: Ronnald Ramp, MD   Chief Complaint  Patient presents with   Follow-up    Hip fractur follow up no concerns    Subjective     HPI     Follow-up    Additional comments: Hip fractur follow up no concerns       Last edited by Thedora Hinders, CMA on 04/04/2023  3:37 PM.       Discussed the use of AI scribe software for clinical note transcription with the patient, who gave verbal consent to proceed.  History of Present Illness   Jill Hayes is a 73 year old female who presents for follow-up after a hip fracture.  She is recovering from a right hip fracture. Her leg is improving, and pain is well managed with ibuprofen. She has a follow-up appointment with orthopedic surgery in April. A follow-up visit with the orthopedic surgeon about a month after the fracture indicated that it looked good.  She has been experiencing dizziness and lightheadedness for a couple of days, which she attributes to sinus problems. Her sinus issues include itchy eyes, rhinorrhea, and sneezing, which she describes as typical for this time of year. No ear pain, fever, or cough.  She has a history of hypertension, with recent measurements showing elevated levels, including a reading of 164/71. She does not currently measure her blood pressure at home as her cuff is broken. She has not been taking metoprolol 12.5 mg twice a day as prescribed because she never picked it up from the pharmacy. She experiences frequent urination, including nocturia twice a night.  She has a history of severe protein-calorie malnutrition and was advised to take folic acid, thiamine supplements, and drink nutritional shakes like Boost or Ensure. She reports having some protein drinks but does not consume them daily. She takes her vitamins and magnesium  regularly. Her weight is still down from 110 lbs in November, but she has gained a pound recently. She mentions eating 'about anything I want' and loves bread, but does not eat chicken or fish often.  She has a history of smoking and was previously smoking two packs a day. She has reduced to about one pack a day. She denies alcohol use.  She inquires about TIAs, mentioning that a nurse informed her about them, and her mother had a history of TIAs. She wonders if this could have contributed to her fall, as she does not remember the incident. A head scan in November showed no stroke or cervical spine issues. She has not experienced any TIA symptoms since her hip fracture.         Past Medical History:  Diagnosis Date   Cataract    Depression     Medications: Outpatient Medications Prior to Visit  Medication Sig   acetaminophen (TYLENOL) 500 MG tablet Take 500-1,000 mg by mouth every 6 (six) hours as needed for mild pain or moderate pain.   feeding supplement (ENSURE ENLIVE / ENSURE PLUS) LIQD Take 237 mLs by mouth 3 (three) times daily between meals.   ibuprofen (ADVIL) 200 MG tablet Take 400-600 mg by mouth every 6 (six) hours as needed for mild pain or moderate pain.   Magnesium 400 MG CAPS Take 2 capsules by mouth 1 day or 1 dose.   Multiple Vitamins-Minerals (CENTRUM ADULT 50+  MULTIGUMMIES) CHEW Chew 1 Dose by mouth 1 day or 1 dose.   thiamine (VITAMIN B-1) 100 MG tablet Take 1 tablet (100 mg total) by mouth daily.   folic acid (FOLVITE) 1 MG tablet Take 1 tablet (1 mg total) by mouth daily. (Patient not taking: Reported on 04/04/2023)   nicotine (NICODERM CQ - DOSED IN MG/24 HOURS) 21 mg/24hr patch Place 1 patch (21 mg total) onto the skin daily. (Patient not taking: Reported on 04/04/2023)   [DISCONTINUED] bisacodyl (DULCOLAX) 10 MG suppository Place 1 suppository (10 mg total) rectally daily as needed (if no BM in 48 hours depsite Miralax & Colace). (Patient not taking: Reported on  04/04/2023)   [DISCONTINUED] docusate sodium (COLACE) 100 MG capsule Take 1 capsule (100 mg total) by mouth 2 (two) times daily. (Patient not taking: Reported on 04/04/2023)   [DISCONTINUED] HYDROcodone-acetaminophen (NORCO/VICODIN) 5-325 MG tablet Take 1 tablet by mouth every 6 (six) hours as needed for moderate pain (pain score 4-6). (Patient not taking: Reported on 04/04/2023)   [DISCONTINUED] metoprolol tartrate (LOPRESSOR) 25 MG tablet Take 0.5 tablets (12.5 mg total) by mouth 2 (two) times daily. (Patient not taking: Reported on 04/04/2023)   [DISCONTINUED] ondansetron (ZOFRAN) 4 MG tablet Take 1 tablet (4 mg total) by mouth every 6 (six) hours as needed for nausea. (Patient not taking: Reported on 04/04/2023)   [DISCONTINUED] polyethylene glycol (MIRALAX / GLYCOLAX) 17 g packet Take 17 g by mouth daily. (Patient not taking: Reported on 04/04/2023)   No facility-administered medications prior to visit.    Review of Systems  Last CBC Lab Results  Component Value Date   WBC 5.9 01/21/2023   HGB 11.5 01/21/2023   HCT 34.8 01/21/2023   MCV 95 01/21/2023   MCH 31.3 01/21/2023   RDW 13.5 01/21/2023   PLT 312 01/21/2023   Last metabolic panel Lab Results  Component Value Date   GLUCOSE 101 (H) 01/21/2023   NA 137 01/21/2023   K 4.1 01/21/2023   CL 96 01/21/2023   CO2 25 01/21/2023   BUN 14 01/21/2023   CREATININE 0.82 01/21/2023   EGFR 75 01/21/2023   CALCIUM 10.1 01/21/2023   PHOS 4.0 01/21/2023   PROT 7.2 01/21/2023   ALBUMIN 4.1 01/21/2023   LABGLOB 3.1 01/21/2023   AGRATIO 1.4 05/04/2022   BILITOT 0.4 01/21/2023   ALKPHOS 231 (H) 01/21/2023   AST 28 01/21/2023   ALT 18 01/21/2023   ANIONGAP 9 12/24/2022   Last lipids Lab Results  Component Value Date   CHOL 130 12/29/2020   HDL 45 12/29/2020   LDLCALC 67 12/29/2020   TRIG 89 12/29/2020   CHOLHDL 2.9 12/29/2020   Last hemoglobin A1c No results found for: "HGBA1C" Last thyroid functions Lab Results  Component Value  Date   TSH 2.280 05/04/2022   Last vitamin D No results found for: "25OHVITD2", "25OHVITD3", "VD25OH"      Objective    BP (!) 164/71   Pulse 84   Ht 5\' 3"  (1.6 m)   Wt 98 lb (44.5 kg)   SpO2 95%   BMI 17.36 kg/m  BP Readings from Last 3 Encounters:  04/04/23 (!) 164/71  01/21/23 130/70  12/28/22 135/61   Wt Readings from Last 3 Encounters:  04/04/23 98 lb (44.5 kg)  01/21/23 97 lb 9.6 oz (44.3 kg)  12/20/22 110 lb 0.2 oz (49.9 kg)        Physical Exam Vitals reviewed.  Constitutional:      General: She is not  in acute distress.    Appearance: She is not ill-appearing, toxic-appearing or diaphoretic.     Comments: Underweight   Eyes:     Conjunctiva/sclera: Conjunctivae normal.  Cardiovascular:     Rate and Rhythm: Normal rate and regular rhythm.     Pulses: Normal pulses.     Heart sounds: Normal heart sounds. No murmur heard.    No friction rub. No gallop.  Pulmonary:     Effort: Pulmonary effort is normal. No respiratory distress.     Breath sounds: Normal breath sounds. No stridor. No wheezing, rhonchi or rales.  Abdominal:     General: Bowel sounds are normal. There is no distension.     Palpations: Abdomen is soft.     Tenderness: There is no abdominal tenderness.  Musculoskeletal:     Right lower leg: No edema.     Left lower leg: No edema.  Skin:    Findings: No erythema or rash.  Neurological:     Mental Status: She is alert and oriented to person, place, and time.  Psychiatric:        Mood and Affect: Mood and affect normal.        Speech: Speech normal.        Behavior: Behavior normal. Behavior is cooperative.       No results found for any visits on 04/04/23.  Assessment & Plan     Problem List Items Addressed This Visit       Other   Tobacco use   Severe protein-calorie malnutrition (HCC)   Relevant Orders   AMB Referral VBCI Care Management   S/P right hip fracture   Alcohol abuse (Chronic)   Other Visit Diagnoses        Primary hypertension    -  Primary   Relevant Medications   metoprolol tartrate (LOPRESSOR) 25 MG tablet   amLODipine (NORVASC) 2.5 MG tablet   Other Relevant Orders   AMB Referral VBCI Care Management     Seasonal allergic rhinitis due to other allergic trigger       Relevant Medications   cetirizine (ZYRTEC) 10 MG tablet          Hypertension Blood pressure is elevated with readings of 164/71 and 180 systolic. She is not taking prescribed metoprolol due to not picking it up from the pharmacy. Hypertension poses risks of dizziness, headaches, and increased risk of stroke or myocardial infarction. - Prescribe amlodipine 2.5 mg daily - Instruct to monitor blood pressure at home - If blood pressure remains over 150/90 after one week, start metoprolol 12.5 mg twice daily - Refer to a pharmacist for blood pressure management support - Follow up in two weeks to reassess blood pressure and medication efficacy  Dizziness and Sinus Problems Reports dizziness and sinus problems attributed to seasonal allergies. No ear pain, fever, or cough. Hypertension could also contribute to dizziness. - Monitor blood pressure and manage hypertension as planned - Consider allergy management if symptoms persist  Right Hip Fracture Recovering well from a right hip fracture. Reports leg is doing well and pain is well controlled with ibuprofen. Follow-up with orthopedic surgery scheduled in April. - Continue ibuprofen for pain management - Follow up with orthopedic surgery in April  Protein-Calorie Malnutrition Severe protein-calorie malnutrition with a BMI of 17.36. She has gained one pound since the last visit. Advised to take folic acid and thiamine supplements and consume nutritional drinks like Boost or Ensure. - Continue folic acid and thiamine supplements - Encourage  daily consumption of nutritional drinks like Boost or Ensure - Monitor weight and nutritional intake  Smoking Cessation Attempting  to reduce smoking and has decreased from two packs a day to about one pack a day. Acknowledged progress and encouraged further reduction. - Continue to encourage smoking reduction and cessation efforts  General Health Maintenance Declined colonoscopy and Cologuard for colon cancer screening. Decision respected but importance of health maintenance emphasized. - Respect decision regarding colon cancer screening         Return in about 2 weeks (around 04/18/2023) for HTN.         Ronnald Ramp, MD  [facility] [phone] (phone) [phone] (fax)  [facility]

## 2023-04-05 ENCOUNTER — Telehealth: Payer: Self-pay

## 2023-04-05 NOTE — Telephone Encounter (Signed)
 Patient picked up her medication this morning and had some questions.  Clarified that cetirizine is for allergies not blood pressure.  Patient voiced understanding.

## 2023-04-05 NOTE — Progress Notes (Signed)
 Care Guide Pharmacy Note  04/05/2023 Name: Jill Hayes MRN: [id number] DOB: 19-Jan-1951  Referred By: Ronnald Ramp, MD Reason for referral: Care Coordination (Outreach to schedule with Pharm d )   Jill Hayes is a 73 y.o. year old female who is a primary care patient of Simmons-Robinson, Tawanna Cooler, MD.  Quincy Simmonds was referred to the pharmacist for assistance related to: HTN  Successful contact was made with the patient to discuss pharmacy services including being ready for the pharmacist to call at least 5 minutes before the scheduled appointment time and to have medication bottles and any blood pressure readings ready for review. The patient agreed to meet with the pharmacist via telephone visit on (date/time).04/16/2023  Penne Lash , RMA     [place]  [facility], [facility] Guide  Direct Dial: [phone]  Website: [email or web address]

## 2023-04-05 NOTE — Telephone Encounter (Signed)
 Copied from CRM [id number]. Topic: Clinical - Medication Question >> Apr 05, 2023  3:44 PM Geroge Baseman wrote: Reason for CRM: amLODipine (NORVASC) 2.5 MG tablet, cetirizine (ZYRTEC) 10 MG tablet, metoprolol tartrate (LOPRESSOR) 25 MG tablet.  Patient would like a call back to discuss these new medications, she has questions.

## 2023-04-16 ENCOUNTER — Telehealth: Payer: Self-pay | Admitting: Pharmacist

## 2023-04-16 ENCOUNTER — Other Ambulatory Visit: Payer: Self-pay | Admitting: Pharmacist

## 2023-04-16 NOTE — Progress Notes (Signed)
   04/16/2023  Patient ID: Jill Hayes, female   DOB: 1950-12-03, 73 y.o.   MRN: [id number]  Called patient for phone visit regarding blood pressure concerns. Unable to reach at this time. Left voicemail requesting call back at earliest convenience. Will try again in 1 week.   Ricka Burdock, PharmD [facility]  Phone Number: [phone]

## 2023-04-23 ENCOUNTER — Telehealth: Payer: Self-pay | Admitting: Pharmacist

## 2023-04-23 ENCOUNTER — Telehealth: Payer: Self-pay

## 2023-04-23 NOTE — Progress Notes (Signed)
 Complex Care Management Care Guide Note  04/23/2023 Name: [name] MRN: [id number] DOB: April 21, 1950  Jill Hayes is a 73 y.o. year old female who is a primary care patient of Simmons-Robinson, Tawanna Cooler, MD and is actively engaged with the care management team. I reached out to Jill Hayes by phone today to assist with re-scheduling  with the Pharmacist.  Follow up plan: Unsuccessful telephone outreach attempt made. A HIPAA compliant phone message was left for the patient providing contact information and requesting a return call.  Penne Lash , RMA     [facility]  [facility], [facility] Guide  Direct Dial: [phone]  Website: [email or web address]

## 2023-04-23 NOTE — Progress Notes (Signed)
   04/23/2023  Patient ID: Jill Hayes, female   DOB: 1950/11/12, 73 y.o.   MRN: [id number]  Called patient for phone visit regarding blood pressure concerns. Unable to reach at this time. Left voicemail requesting call back at earliest convenience. Will try again in 1 week.  2nd attempt   Ricka Burdock, PharmD [facility]  Phone Number: [phone]

## 2023-04-29 NOTE — Progress Notes (Signed)
 Care Guide Pharmacy Note  04/29/2023 Name: Jill Hayes MRN: [id number] DOB: 19-Dec-1950  Referred By: Ronnald Ramp, MD Reason for referral: Care Coordination (Outreach to reschedule with Pharm d )   Jill Hayes is a 73 y.o. year old female who is a primary care patient of Simmons-Robinson, Tawanna Cooler, MD.  Quincy Simmonds was referred to the pharmacist for assistance related to: HTN  Successful contact was made with the patient to discuss pharmacy services including being ready for the pharmacist to call at least 5 minutes before the scheduled appointment time and to have medication bottles and any blood pressure readings ready for review. The patient agreed to meet with the pharmacist via telephone visit on (date/time).05/07/2023  Penne Lash , RMA     [place]  [facility], [facility] Guide  Direct Dial: [phone]  Website: [email or web address]

## 2023-05-02 ENCOUNTER — Encounter: Payer: Self-pay | Admitting: Family Medicine

## 2023-05-02 ENCOUNTER — Ambulatory Visit (INDEPENDENT_AMBULATORY_CARE_PROVIDER_SITE_OTHER): Admitting: Family Medicine

## 2023-05-02 VITALS — BP 145/69 | HR 69 | Temp 97.8°F | Ht 63.0 in | Wt 99.8 lb

## 2023-05-02 DIAGNOSIS — F1010 Alcohol abuse, uncomplicated: Secondary | ICD-10-CM

## 2023-05-02 DIAGNOSIS — E43 Unspecified severe protein-calorie malnutrition: Secondary | ICD-10-CM

## 2023-05-02 DIAGNOSIS — Z8673 Personal history of transient ischemic attack (TIA), and cerebral infarction without residual deficits: Secondary | ICD-10-CM

## 2023-05-02 DIAGNOSIS — I10 Essential (primary) hypertension: Secondary | ICD-10-CM | POA: Diagnosis not present

## 2023-05-02 DIAGNOSIS — Z87891 Personal history of nicotine dependence: Secondary | ICD-10-CM

## 2023-05-02 DIAGNOSIS — Z1231 Encounter for screening mammogram for malignant neoplasm of breast: Secondary | ICD-10-CM | POA: Diagnosis not present

## 2023-05-02 DIAGNOSIS — R748 Abnormal levels of other serum enzymes: Secondary | ICD-10-CM | POA: Diagnosis not present

## 2023-05-02 DIAGNOSIS — Z8781 Personal history of (healed) traumatic fracture: Secondary | ICD-10-CM

## 2023-05-02 NOTE — Assessment & Plan Note (Signed)
 Patient reports maintaining abstinence since fall

## 2023-05-02 NOTE — Assessment & Plan Note (Signed)
 Blood pressure is 145/69 mmHg, within the target range of less than 150/90 mmHg. She experiences hypotensive episodes if blood pressure medication is taken when systolic pressure is 140 mmHg or lower. No lightheadedness or dizziness reported with current medication regimen. She monitors her blood pressure before taking metoprolol and does not take it if systolic pressure is 140 mmHg or lower. Chronic - Continue metoprolol 12.5 mg twice daily - Continue amlodipine 2.5 mg daily - Monitor blood pressure regularly and adjust medication intake based on readings

## 2023-05-02 NOTE — Assessment & Plan Note (Signed)
 Severe Protein-Calorie Malnutrition Chronic BMI is 17.68, indicating severe protein-calorie malnutrition. Weight has increased by one pound since the last visit, now at 99 pounds. She reports eating two meals a day with snacks, but appetite is variable. - Encourage increased caloric intake with emphasis on protein and healthy fats

## 2023-05-02 NOTE — Progress Notes (Signed)
 Established patient visit   Patient: Jill Hayes   DOB: March 25, 1950   73 y.o. Female  MRN: [id number] Visit Date: 05/02/2023  Today's healthcare provider: Ronnald Ramp, MD   Chief Complaint  Patient presents with   Hypertension    Pt reports taking meds as prescribed, blood pressure running around 140/70 at home if lower she says she will not take bp med as it will drop it too low,    Subjective     HPI     Hypertension    Additional comments: Pt reports taking meds as prescribed, blood pressure running around 140/70 at home if lower she says she will not take bp med as it will drop it too low,       Last edited by Allayne Stack on 05/02/2023  2:38 PM.       Discussed the use of AI scribe software for clinical note transcription with the patient, who gave verbal consent to proceed.  History of Present Illness Jill Hayes is a 73 year old female with hypertension who presents for follow-up.  Her blood pressure is currently 145/69, with a target to maintain it below 150/90. She is taking amlodipine 2.5 mg daily and metoprolol 12.5 mg twice daily. She adjusts her metoprolol dose based on her systolic blood pressure, avoiding it if her reading is 140 or lower to prevent hypotension. She regularly checks her blood pressure before deciding on her metoprolol dose. No lightheadedness or dizziness with her current medications.  Her BMI is 17.68, with a current weight of 99 pounds, which is an increase of one pound since her last visit a month ago. She describes her appetite as variable, sometimes lacking hunger, and typically consumes two meals a day with snacks. Her diet often includes bread, peanut butter, and small donuts.  She smokes about a pack a day. She denies alcohol consumption.  She inquires about darkening of her leg, attributing it to aging, and mentions no associated pain or discomfort.     Past Medical History:  Diagnosis Date   Cataract     Depression     Medications: Outpatient Medications Prior to Visit  Medication Sig   acetaminophen (TYLENOL) 500 MG tablet Take 500-1,000 mg by mouth every 6 (six) hours as needed for mild pain or moderate pain.   amLODipine (NORVASC) 2.5 MG tablet Take 1 tablet (2.5 mg total) by mouth daily.   cetirizine (ZYRTEC) 10 MG tablet Take 1 tablet (10 mg total) by mouth daily.   feeding supplement (ENSURE ENLIVE / ENSURE PLUS) LIQD Take 237 mLs by mouth 3 (three) times daily between meals.   ibuprofen (ADVIL) 200 MG tablet Take 400-600 mg by mouth every 6 (six) hours as needed for mild pain or moderate pain.   Magnesium 400 MG CAPS Take 2 capsules by mouth 1 day or 1 dose.   metoprolol tartrate (LOPRESSOR) 25 MG tablet Take 0.5 tablets (12.5 mg total) by mouth 2 (two) times daily.   Multiple Vitamins-Minerals (CENTRUM ADULT 50+ MULTIGUMMIES) CHEW Chew 1 Dose by mouth 1 day or 1 dose.   nicotine (NICODERM CQ - DOSED IN MG/24 HOURS) 21 mg/24hr patch Place 1 patch (21 mg total) onto the skin daily.   folic acid (FOLVITE) 1 MG tablet Take 1 tablet (1 mg total) by mouth daily. (Patient not taking: Reported on 05/02/2023)   thiamine (VITAMIN B-1) 100 MG tablet Take 1 tablet (100 mg total) by mouth daily. (Patient not taking: Reported  on 05/02/2023)   No facility-administered medications prior to visit.    Review of Systems  Last metabolic panel Lab Results  Component Value Date   GLUCOSE 101 (H) 01/21/2023   NA 137 01/21/2023   K 4.1 01/21/2023   CL 96 01/21/2023   CO2 25 01/21/2023   BUN 14 01/21/2023   CREATININE 0.82 01/21/2023   EGFR 75 01/21/2023   CALCIUM 10.1 01/21/2023   PHOS 4.0 01/21/2023   PROT 7.2 01/21/2023   ALBUMIN 4.1 01/21/2023   LABGLOB 3.1 01/21/2023   AGRATIO 1.4 05/04/2022   BILITOT 0.4 01/21/2023   ALKPHOS 231 (H) 01/21/2023   AST 28 01/21/2023   ALT 18 01/21/2023   ANIONGAP 9 12/24/2022   Last lipids Lab Results  Component Value Date   CHOL 130 12/29/2020   HDL  45 12/29/2020   LDLCALC 67 12/29/2020   TRIG 89 12/29/2020   CHOLHDL 2.9 12/29/2020   Last hemoglobin A1c No results found for: "HGBA1C" Last thyroid functions Lab Results  Component Value Date   TSH 2.280 05/04/2022        Objective    BP (!) 145/69   Pulse 69   Temp 97.8 F (36.6 C)   Ht 5\' 3"  (1.6 m)   Wt 99 lb 12.8 oz (45.3 kg)   SpO2 100%   BMI 17.68 kg/m   BP Readings from Last 3 Encounters:  05/02/23 (!) 145/69  04/04/23 (!) 164/71  01/21/23 130/70   Wt Readings from Last 3 Encounters:  05/02/23 99 lb 12.8 oz (45.3 kg)  04/04/23 98 lb (44.5 kg)  01/21/23 97 lb 9.6 oz (44.3 kg)        Physical Exam Vitals reviewed.  Constitutional:      General: She is not in acute distress.    Appearance: Normal appearance. She is not ill-appearing, toxic-appearing or diaphoretic.  Eyes:     Conjunctiva/sclera: Conjunctivae normal.  Cardiovascular:     Rate and Rhythm: Normal rate and regular rhythm.     Pulses: Normal pulses.     Heart sounds: Normal heart sounds. No murmur heard.    No friction rub. No gallop.  Pulmonary:     Effort: Pulmonary effort is normal. No respiratory distress.     Breath sounds: Normal breath sounds. No stridor. No wheezing, rhonchi or rales.  Abdominal:     General: Bowel sounds are normal. There is no distension.     Palpations: Abdomen is soft.     Tenderness: There is no abdominal tenderness.  Musculoskeletal:     Right lower leg: No edema.     Left lower leg: No edema.  Skin:    Findings: No erythema or rash.     Comments: Macular brown pigmentation, diffusely distributed on bilateral lower extremities similar to hemosiderin changes vs Lentigines  Neurological:     Mental Status: She is alert and oriented to person, place, and time.  Psychiatric:        Mood and Affect: Mood and affect normal.        Speech: Speech normal.        Behavior: Behavior normal. Behavior is cooperative.        No results found for any visits  on 05/02/23.  Assessment & Plan     Problem List Items Addressed This Visit       Cardiovascular and Mediastinum   Primary hypertension - Primary   Blood pressure is 145/69 mmHg, within the target range of less than 150/90  mmHg. She experiences hypotensive episodes if blood pressure medication is taken when systolic pressure is 140 mmHg or lower. No lightheadedness or dizziness reported with current medication regimen. She monitors her blood pressure before taking metoprolol and does not take it if systolic pressure is 140 mmHg or lower. Chronic - Continue metoprolol 12.5 mg twice daily - Continue amlodipine 2.5 mg daily - Monitor blood pressure regularly and adjust medication intake based on readings      Relevant Orders   Comprehensive metabolic panel with GFR   PTH, Intact and Calcium   Lipid panel   Chronic cerebrovascular accident (CVA) (Chronic)   Relevant Orders   Lipid panel     Other   Severe protein-calorie malnutrition (HCC)   Severe Protein-Calorie Malnutrition Chronic BMI is 17.68, indicating severe protein-calorie malnutrition. Weight has increased by one pound since the last visit, now at 99 pounds. She reports eating two meals a day with snacks, but appetite is variable. - Encourage increased caloric intake with emphasis on protein and healthy fats      S/P right hip fracture   History of smoking greater than 50 pack years   She smokes about a pack a day and is considering cutting back. Smoking cessation is encouraged to improve overall health. - Encouraged smoking cessation - Discuss potential benefits of quitting smoking - pt is in pre-contemplative stage       Alcohol abuse (Chronic)   Patient reports maintaining abstinence since fall       Other Visit Diagnoses       Encounter for screening mammogram for malignant neoplasm of breast         High alkaline phosphatase       Relevant Orders   Comprehensive metabolic panel with GFR   PTH, Intact and  Calcium       Assessment & Plan   General Health Maintenance She declined tetanus, pneumococcal, and shingles vaccines, as well as colonoscopy, lung cancer screening, and mammogram. Discussed the importance of these vaccinations and screenings, particularly given her low BMI and smoking history, to prevent severe illnesses such as pneumonia. - Document decision to decline vaccinations and screenings - Encourage reconsideration of vaccinations and screenings in future visits  Follow-up She is managing independently at home without home health services. No alcohol consumption reported. - Schedule follow-up appointment in August 2025   Elevated Alk Phos  - Order complete metabolic panel, lipid panel, thyroid, parathyroid, and calcium levels - Recheck alkaline phosphatase levels, CMP ordered      Return in about 4 months (around 09/01/2023) for CHRONIC F/U.         Ronnald Ramp, MD  [facility] [phone] (phone) [phone] (fax)  [facility]

## 2023-05-02 NOTE — Assessment & Plan Note (Signed)
 She smokes about a pack a day and is considering cutting back. Smoking cessation is encouraged to improve overall health. - Encouraged smoking cessation - Discuss potential benefits of quitting smoking - pt is in pre-contemplative stage

## 2023-05-03 LAB — COMPREHENSIVE METABOLIC PANEL WITH GFR
ALT: 12 IU/L (ref 0–32)
AST: 29 IU/L (ref 0–40)
Albumin: 4 g/dL (ref 3.8–4.8)
Alkaline Phosphatase: 147 IU/L — ABNORMAL HIGH (ref 44–121)
BUN/Creatinine Ratio: 11 — ABNORMAL LOW (ref 12–28)
BUN: 7 mg/dL — ABNORMAL LOW (ref 8–27)
Bilirubin Total: 0.3 mg/dL (ref 0.0–1.2)
CO2: 27 mmol/L (ref 20–29)
Calcium: 9.9 mg/dL (ref 8.7–10.3)
Chloride: 96 mmol/L (ref 96–106)
Creatinine, Ser: 0.65 mg/dL (ref 0.57–1.00)
Globulin, Total: 2.4 g/dL (ref 1.5–4.5)
Glucose: 121 mg/dL — ABNORMAL HIGH (ref 70–99)
Potassium: 4 mmol/L (ref 3.5–5.2)
Sodium: 137 mmol/L (ref 134–144)
Total Protein: 6.4 g/dL (ref 6.0–8.5)
eGFR: 93 mL/min/{1.73_m2} (ref >59)

## 2023-05-03 LAB — PTH, INTACT AND CALCIUM: PTH: 24 pg/mL (ref 15–65)

## 2023-05-03 LAB — LIPID PANEL
Chol/HDL Ratio: 4.3 ratio (ref 0.0–4.4)
Cholesterol, Total: 211 mg/dL — ABNORMAL HIGH (ref 100–199)
HDL: 49 mg/dL (ref >39)
LDL Chol Calc (NIH): 127 mg/dL — ABNORMAL HIGH (ref 0–99)
Triglycerides: 197 mg/dL — ABNORMAL HIGH (ref 0–149)
VLDL Cholesterol Cal: 35 mg/dL (ref 5–40)

## 2023-05-07 ENCOUNTER — Telehealth: Payer: Self-pay | Admitting: Pharmacist

## 2023-05-07 ENCOUNTER — Other Ambulatory Visit: Payer: Self-pay | Admitting: Pharmacist

## 2023-05-07 NOTE — Progress Notes (Signed)
   05/07/2023  Patient ID: Jill Hayes, female   DOB: Jun 09, 1950, 73 y.o.   MRN: [id number]  Tried calling patient for scheduled call today. Unable to reach at this time. Left HIPAA compliant voicemail requesting call back at earliest convenience.  No need to try again. Already completed 3 attempts to reach patient, with at least another 3 attempts previously from scheduler. Unable to proceed. Will meet with patient if she returns my call.  Final attempt.   Ricka Burdock, PharmD [facility]  Phone Number: [phone]

## 2023-05-20 ENCOUNTER — Telehealth: Payer: Self-pay

## 2023-05-20 NOTE — Progress Notes (Unsigned)
 Complex Care Management Care Guide Note  05/20/2023 Name: Jill Hayes MRN: [id number] DOB: Jun 13, 1950  [name] is a 73 y.o. year old female who is a primary care patient of Simmons-Robinson, Judyann Number, MD and is actively engaged with the care management team. I reached out to Rheya A Kilgallon by phone today to assist with re-scheduling  with the Pharmacist.  Follow up plan: Unsuccessful telephone outreach attempt made. A HIPAA compliant phone message was left for the patient providing contact information and requesting a return call.  Lenton Rail , RMA     [facility]  [facility], [facility] Guide  Direct Dial: [phone]  Website: [email or web address]

## 2023-05-22 NOTE — Progress Notes (Signed)
 Complex Care Management Care Guide Note  05/22/2023 Name: Jill Hayes MRN: [id number] DOB: 25-Jul-1950  Jill Hayes is a 73 y.o. year old female who is a primary care patient of Simmons-Robinson, Judyann Number, MD and is actively engaged with the care management team. I reached out to Blessed A Rone by phone today to assist with re-scheduling  with the Pharmacist.  Follow up plan: Telephone appointment with complex care management team member scheduled for:  5/9  Barnie Bora  [facility]  [facility], [facility] Guide  Direct Dial: [phone]  Fax [phone]

## 2023-06-07 ENCOUNTER — Other Ambulatory Visit: Payer: Self-pay | Admitting: Pharmacist

## 2023-06-07 NOTE — Progress Notes (Signed)
   06/07/2023 Name: Jill Hayes MRN: [id number] DOB: [birth year]  Chief Complaint  Patient presents with   Hypertension    Jill Hayes is a 73 y.o. year old female who presented for a telephone visit.   They were referred to the pharmacist by their PCP for assistance in managing hypertension.    Subjective:  Care Team: Primary Care Provider: Mimi Alt, MD ; Next Scheduled Visit: 09/25/23 Clinical Pharmacist: Delvin File, PharmD  Medication Access/Adherence  Current Pharmacy:  [facility] [phone] [place], [place] - [address] AT [facility] [address] [place] [address] Phone: [phone] Fax: [phone]   Patient reports affordability concerns with their medications: No  Patient reports access/transportation concerns to their pharmacy: No  Patient reports adherence concerns with their medications:  No     Hypertension:  Current medications: Amlodipine  2.5mg  daily, Metoprolol  tartrate 12.5mg  twice a day only if systolic >140 Medications previously tried: Unknown  Patient has a wrist cuff- not verified yet Current blood pressure readings readings: 143/90, 122/65, 121/70, 123/67, 127/76, 115/64, 118/70, 121/67 Current heart rate: 83, 90, 80, 87, 86, 87  Patient denies hypotensive s/sx including dizziness, lightheadedness.  Patient denies hypertensive symptoms including headache, chest pain, shortness of breath  *Referred due to recent BP readings: 145/69 (4/3 visit) and 164/71 (3/6 recent R hip fracture)  Objective:  No results found for: "HGBA1C"  Lab Results  Component Value Date   CREATININE 0.65 05/02/2023   BUN 7 (L) 05/02/2023   NA 137 05/02/2023   K 4.0 05/02/2023   CL 96 05/02/2023   CO2 27 05/02/2023    Lab Results  Component Value Date   CHOL 211 (H) 05/02/2023   HDL 49 05/02/2023   LDLCALC 127 (H) 05/02/2023   TRIG 197 (H) 05/02/2023   CHOLHDL 4.3 05/02/2023    Medications Reviewed  Today   Medications were not reviewed in this encounter       Assessment/Plan:   Hypertension: - Currently uncontrolled - Reviewed long term cardiovascular and renal outcomes of uncontrolled blood pressure - Reviewed appropriate blood pressure monitoring technique and reviewed goal blood pressure. Recommended to check home blood pressure and heart rate twice a day - Recommend to minimize salty foods   *Also asked about smoking cessation- declined any interest in quitting at this time    Follow Up Plan:  - Follow-up on 08/29/23 for more BP readings and remind to bring cuff to next visit - Patient reports checking BP twice a day - First meal of the day with breakfast at 11AM and evening around dinnertime at 6-7PM  - If systolic BP >140, patient will take metoprolol  - All readings have looked at goal, except for this morning- no changes made today; continue to monitor  - May be her diet for the day - Advised to bring BP cuff to next visit- upper arm cuffs are preferred   Delvin File, PharmD [facility]  Phone Number: [phone]

## 2023-06-13 ENCOUNTER — Telehealth: Payer: Self-pay

## 2023-06-13 NOTE — Telephone Encounter (Signed)
 Pt declined bone density scan previously

## 2023-06-13 NOTE — Telephone Encounter (Signed)
 Copied from CRM [id number]. Topic: Clinical - Lab/Test Results >> Jun 13, 2023  3:35 PM Rosaria Common wrote: Reason for CRM: Fredrik Jensen from [facility] calling to verify if pt has completed bone density testing for compliance. Called clinic to verify. Fredrik Jensen disconnected call as receptionist was verifying status of testing with nurse.

## 2023-06-13 NOTE — Telephone Encounter (Signed)
 If [employer] calls back please inform them per Dr R the pt declined bone density scan previously.

## 2023-08-29 ENCOUNTER — Other Ambulatory Visit: Payer: Self-pay | Admitting: Pharmacist

## 2023-08-29 NOTE — Progress Notes (Addendum)
   08/29/2023  Patient ID: Cathren DELENA Seats, female   DOB: [birth year], 73 y.o.   MRN: [id number]  Attempted to contact patient for scheduled appointment for medication management. Left HIPAA compliant message for patient to return my call at their convenience.    If no returned call, will try again in 1 week.   Update from 8/7: Attempt #2. See above. Left voicemail to return call at earliest convenience again.   Update from 8/14: Attempt #3. Unable to reach. Closing follow-up attempts and referral.    Aloysius Lewis, PharmD [facility]  Phone Number: [phone]

## 2023-09-26 ENCOUNTER — Inpatient Hospital Stay
Admission: EM | Admit: 2023-09-26 | Discharge: 2023-09-29 | DRG: 871 | Disposition: A | Discharge status: Home Health | Attending: Internal Medicine | Admitting: Internal Medicine

## 2023-09-26 ENCOUNTER — Emergency Department

## 2023-09-26 ENCOUNTER — Other Ambulatory Visit: Payer: Self-pay

## 2023-09-26 DIAGNOSIS — F17210 Nicotine dependence, cigarettes, uncomplicated: Secondary | ICD-10-CM | POA: Diagnosis present

## 2023-09-26 DIAGNOSIS — E876 Hypokalemia: Secondary | ICD-10-CM | POA: Diagnosis not present

## 2023-09-26 DIAGNOSIS — Z90710 Acquired absence of both cervix and uterus: Secondary | ICD-10-CM

## 2023-09-26 DIAGNOSIS — E872 Acidosis, unspecified: Principal | ICD-10-CM

## 2023-09-26 DIAGNOSIS — Z716 Tobacco abuse counseling: Secondary | ICD-10-CM

## 2023-09-26 DIAGNOSIS — K760 Fatty (change of) liver, not elsewhere classified: Secondary | ICD-10-CM | POA: Diagnosis present

## 2023-09-26 DIAGNOSIS — E43 Unspecified severe protein-calorie malnutrition: Secondary | ICD-10-CM | POA: Diagnosis present

## 2023-09-26 DIAGNOSIS — Y902 Blood alcohol level of 40-59 mg/100 ml: Secondary | ICD-10-CM | POA: Diagnosis present

## 2023-09-26 DIAGNOSIS — N390 Urinary tract infection, site not specified: Secondary | ICD-10-CM | POA: Diagnosis present

## 2023-09-26 DIAGNOSIS — R7401 Elevation of levels of liver transaminase levels: Secondary | ICD-10-CM

## 2023-09-26 DIAGNOSIS — Z681 Body mass index (BMI) 19 or less, adult: Secondary | ICD-10-CM | POA: Diagnosis not present

## 2023-09-26 DIAGNOSIS — E878 Other disorders of electrolyte and fluid balance, not elsewhere classified: Secondary | ICD-10-CM | POA: Diagnosis present

## 2023-09-26 DIAGNOSIS — F10239 Alcohol dependence with withdrawal, unspecified: Secondary | ICD-10-CM | POA: Diagnosis present

## 2023-09-26 DIAGNOSIS — R42 Dizziness and giddiness: Secondary | ICD-10-CM | POA: Diagnosis present

## 2023-09-26 DIAGNOSIS — I10 Essential (primary) hypertension: Secondary | ICD-10-CM | POA: Diagnosis present

## 2023-09-26 DIAGNOSIS — Z87891 Personal history of nicotine dependence: Secondary | ICD-10-CM

## 2023-09-26 DIAGNOSIS — G9341 Metabolic encephalopathy: Secondary | ICD-10-CM | POA: Diagnosis present

## 2023-09-26 DIAGNOSIS — E8729 Other acidosis: Secondary | ICD-10-CM | POA: Diagnosis present

## 2023-09-26 DIAGNOSIS — A4151 Sepsis due to Escherichia coli [E. coli]: Principal | ICD-10-CM | POA: Diagnosis present

## 2023-09-26 DIAGNOSIS — Z8673 Personal history of transient ischemic attack (TIA), and cerebral infarction without residual deficits: Secondary | ICD-10-CM

## 2023-09-26 DIAGNOSIS — Z79899 Other long term (current) drug therapy: Secondary | ICD-10-CM | POA: Diagnosis not present

## 2023-09-26 DIAGNOSIS — F1090 Alcohol use, unspecified, uncomplicated: Secondary | ICD-10-CM

## 2023-09-26 DIAGNOSIS — F1010 Alcohol abuse, uncomplicated: Secondary | ICD-10-CM | POA: Diagnosis not present

## 2023-09-26 DIAGNOSIS — E8721 Acute metabolic acidosis: Secondary | ICD-10-CM | POA: Diagnosis not present

## 2023-09-26 LAB — URINALYSIS, ROUTINE W REFLEX MICROSCOPIC
Bilirubin Urine: NEGATIVE
Glucose, UA: NEGATIVE mg/dL
Hgb urine dipstick: NEGATIVE
Ketones, ur: 20 mg/dL — AB
Nitrite: NEGATIVE
Protein, ur: NEGATIVE mg/dL
Specific Gravity, Urine: 1.006 (ref 1.005–1.030)
pH: 6 (ref 5.0–8.0)

## 2023-09-26 LAB — URINE DRUG SCREEN, QUALITATIVE (ARMC ONLY)
Amphetamines, Ur Screen: NOT DETECTED
Barbiturates, Ur Screen: NOT DETECTED
Benzodiazepine, Ur Scrn: NOT DETECTED
Cannabinoid 50 Ng, Ur ~~LOC~~: NOT DETECTED
Cocaine Metabolite,Ur ~~LOC~~: NOT DETECTED
MDMA (Ecstasy)Ur Screen: NOT DETECTED
Methadone Scn, Ur: NOT DETECTED
Opiate, Ur Screen: NOT DETECTED
Phencyclidine (PCP) Ur S: NOT DETECTED
Tricyclic, Ur Screen: NOT DETECTED

## 2023-09-26 LAB — COMPREHENSIVE METABOLIC PANEL WITH GFR
ALT: 68 U/L — ABNORMAL HIGH (ref 0–44)
AST: 207 U/L — ABNORMAL HIGH (ref 15–41)
Albumin: 4 g/dL (ref 3.5–5.0)
Alkaline Phosphatase: 137 U/L — ABNORMAL HIGH (ref 38–126)
Anion gap: 29 — ABNORMAL HIGH (ref 5–15)
BUN: 9 mg/dL (ref 8–23)
CO2: 14 mmol/L — ABNORMAL LOW (ref 22–32)
Calcium: 8.7 mg/dL — ABNORMAL LOW (ref 8.9–10.3)
Chloride: 95 mmol/L — ABNORMAL LOW (ref 98–111)
Creatinine, Ser: 0.91 mg/dL (ref 0.44–1.00)
GFR, Estimated: >60 mL/min (ref >60)
Glucose, Bld: 83 mg/dL (ref 70–99)
Potassium: 3.6 mmol/L (ref 3.5–5.1)
Sodium: 138 mmol/L (ref 135–145)
Total Bilirubin: 1.3 mg/dL — ABNORMAL HIGH (ref 0.0–1.2)
Total Protein: 7.3 g/dL (ref 6.5–8.1)

## 2023-09-26 LAB — CBC
HCT: 45.9 % (ref 36.0–46.0)
Hemoglobin: 14.9 g/dL (ref 12.0–15.0)
MCH: 32 pg (ref 26.0–34.0)
MCHC: 32.5 g/dL (ref 30.0–36.0)
MCV: 98.5 fL (ref 80.0–100.0)
Platelets: 173 K/uL (ref 150–400)
RBC: 4.66 MIL/uL (ref 3.87–5.11)
RDW: 15.4 % (ref 11.5–15.5)
WBC: 11.4 K/uL — ABNORMAL HIGH (ref 4.0–10.5)
nRBC: 0 % (ref 0.0–0.2)

## 2023-09-26 LAB — BETA-HYDROXYBUTYRIC ACID: Beta-Hydroxybutyric Acid: 5.36 mmol/L — ABNORMAL HIGH (ref 0.05–0.27)

## 2023-09-26 LAB — OSMOLALITY: Osmolality: 307 mosm/kg — ABNORMAL HIGH (ref 275–295)

## 2023-09-26 LAB — TROPONIN I (HIGH SENSITIVITY)
Troponin I (High Sensitivity): 18 ng/L — ABNORMAL HIGH (ref <18)
Troponin I (High Sensitivity): 19 ng/L — ABNORMAL HIGH (ref <18)

## 2023-09-26 LAB — BRAIN NATRIURETIC PEPTIDE: B Natriuretic Peptide: 67.5 pg/mL (ref 0.0–100.0)

## 2023-09-26 LAB — ETHANOL: Alcohol, Ethyl (B): 42 mg/dL — ABNORMAL HIGH (ref <15)

## 2023-09-26 LAB — GLUCOSE, CAPILLARY: Glucose-Capillary: 61 mg/dL — ABNORMAL LOW (ref 70–99)

## 2023-09-26 LAB — LACTIC ACID, PLASMA: Lactic Acid, Venous: 7.8 mmol/L (ref 0.5–1.9)

## 2023-09-26 LAB — BLOOD GAS, VENOUS

## 2023-09-26 MED ORDER — CHLORDIAZEPOXIDE HCL 25 MG PO CAPS
25.0000 mg | ORAL_CAPSULE | Freq: Three times a day (TID) | ORAL | Status: DC
  Administered 2023-09-28 – 2023-09-29 (×2): 25 mg via ORAL
  Filled 2023-09-26 (×2): qty 1

## 2023-09-26 MED ORDER — SODIUM CHLORIDE 0.9 % IV SOLN
2.0000 g | Freq: Once | INTRAVENOUS | Status: AC
  Administered 2023-09-26: 2 g via INTRAVENOUS
  Filled 2023-09-26: qty 20

## 2023-09-26 MED ORDER — CHLORDIAZEPOXIDE HCL 25 MG PO CAPS
25.0000 mg | ORAL_CAPSULE | Freq: Four times a day (QID) | ORAL | Status: AC
  Administered 2023-09-27 – 2023-09-28 (×6): 25 mg via ORAL
  Filled 2023-09-26 (×6): qty 1

## 2023-09-26 MED ORDER — CHLORDIAZEPOXIDE HCL 25 MG PO CAPS: 25.0000 mg | ORAL_CAPSULE | Freq: Four times a day (QID) | ORAL | Status: DC | PRN

## 2023-09-26 MED ORDER — POLYETHYLENE GLYCOL 3350 17 G PO PACK: 17.0000 g | PACK | Freq: Every day | ORAL | Status: DC | PRN

## 2023-09-26 MED ORDER — IOHEXOL 300 MG/ML  SOLN
80.0000 mL | Freq: Once | INTRAMUSCULAR | Status: AC | PRN
  Administered 2023-09-26: 80 mL via INTRAVENOUS

## 2023-09-26 MED ORDER — SODIUM BICARBONATE 8.4 % IV SOLN
INTRAVENOUS | Status: DC
  Filled 2023-09-26: qty 1000
  Filled 2023-09-26: qty 150

## 2023-09-26 MED ORDER — CHLORHEXIDINE GLUCONATE CLOTH 2 % EX PADS: 6.0000 | MEDICATED_PAD | Freq: Every day | CUTANEOUS | Status: DC

## 2023-09-26 MED ORDER — DOCUSATE SODIUM 100 MG PO CAPS: 100.0000 mg | ORAL_CAPSULE | Freq: Two times a day (BID) | ORAL | Status: DC | PRN

## 2023-09-26 MED ORDER — CHLORDIAZEPOXIDE HCL 25 MG PO CAPS: 25.0000 mg | ORAL_CAPSULE | ORAL | Status: DC

## 2023-09-26 MED ORDER — LOPERAMIDE HCL 2 MG PO CAPS: 2.0000 mg | ORAL_CAPSULE | ORAL | Status: DC | PRN

## 2023-09-26 MED ORDER — LACTATED RINGERS IV BOLUS
1000.0000 mL | Freq: Once | INTRAVENOUS | Status: AC
  Administered 2023-09-27: 1000 mL via INTRAVENOUS

## 2023-09-26 MED ORDER — ONDANSETRON HCL 4 MG/2ML IJ SOLN
4.0000 mg | Freq: Four times a day (QID) | INTRAMUSCULAR | Status: DC | PRN
  Administered 2023-09-27: 4 mg via INTRAVENOUS
  Filled 2023-09-26: qty 2

## 2023-09-26 MED ORDER — SODIUM CHLORIDE 0.9 % IV BOLUS
500.0000 mL | Freq: Once | INTRAVENOUS | Status: AC
  Administered 2023-09-26: 500 mL via INTRAVENOUS

## 2023-09-26 MED ORDER — THIAMINE HCL 100 MG/ML IJ SOLN
100.0000 mg | Freq: Once | INTRAMUSCULAR | Status: DC
  Filled 2023-09-26: qty 2

## 2023-09-26 MED ORDER — HYDROXYZINE HCL 50 MG PO TABS
25.0000 mg | ORAL_TABLET | Freq: Four times a day (QID) | ORAL | Status: DC | PRN
Start: 2023-09-26 — End: 2023-09-29

## 2023-09-26 MED ORDER — ADULT MULTIVITAMIN W/MINERALS CH
1.0000 | ORAL_TABLET | Freq: Every day | ORAL | Status: DC
  Administered 2023-09-27 – 2023-09-29 (×3): 1 via ORAL
  Filled 2023-09-26 (×3): qty 1

## 2023-09-26 MED ORDER — CHLORDIAZEPOXIDE HCL 25 MG PO CAPS: 25.0000 mg | ORAL_CAPSULE | Freq: Every day | ORAL | Status: DC

## 2023-09-26 MED ORDER — ENOXAPARIN SODIUM 30 MG/0.3ML IJ SOSY
30.0000 mg | PREFILLED_SYRINGE | Freq: Every day | INTRAMUSCULAR | Status: DC
  Filled 2023-09-26: qty 0.3

## 2023-09-26 NOTE — H&P (Signed)
 NAME:  Jill Hayes, MRN:  [id number], DOB:  1950-06-11, LOS: 0 ADMISSION DATE:  09/26/2023, CONSULTATION DATE:  09/26/23 REFERRING MD:  Dr. Nicholaus, CHIEF COMPLAINT:  Dizziness   History of Present Illness:  73 yo F presenting to [facility] ED from home for evaluation of dizziness.  History obtained per chart review and patient bedside report.***  ED course: ***.  Medications given: Rocephin , 500 mL bolus, IV contrast Initial Vitals: 97.8, 20, 102, 159/84 & 99% on RA***  Significant labs: (Labs/ Imaging personally reviewed) I, Jenita Ruth Rust-Chester, AGACNP-BC, personally viewed and interpreted this ECG. EKG Interpretation: Date: ***, EKG Time: ***, Rate: ***, Rhythm: ***, QRS Axis:  *** Intervals: ***, ST/T Wave abnormalities: ***, Narrative Interpretation: *** Chemistry: Na+: 138, K+: 3.6, Cl: 95, BUN/Cr.: 9/ 0.91, Serum CO2/ AG: 14/29, alk phos: 137, AST/ALT: 207/68, T.Bili: 1.3 Hematology: WBC: 11.4, Hgb: 14.9,  Troponin: 18, BNP: 67.5, Lactic/ PCT: ***,   VBG: 7.04/ 62/ <31/ 16.8 CXR 09/26/23: no active cardiopulmonary disease CT head wo contrast 09/26/23: Generalized cerebral atrophy with chronic white matter small vessel ischemic changes. Bilateral chronic basal ganglia lacunar infarcts. Moderate to marked severity right maxillary sinus disease. CT abdomen/pelvis w contrast 09/26/23: fatty liver. No acute abnormality to correspond with the given clinical history.  PCCM consulted for admission due to ***.  Pertinent  Medical History  ***  Significant Hospital Events: Including procedures, antibiotic start and stop dates in addition to other pertinent events     Interim History / Subjective:  ***  Objective    Blood pressure (!) 159/84, pulse (!) 102, temperature 97.8 F (36.6 C), temperature source Oral, resp. rate 20, height 5' 4 (1.626 m), weight 45 kg, SpO2 99%.        Intake/Output Summary (Last 24 hours) at 09/26/2023 2242 Last data filed at 09/26/2023 2233 Gross  per 24 hour  Intake 641.67 ml  Output --  Net 641.67 ml   Filed Weights   09/26/23 2018  Weight: 45 kg    Examination: General: Adult ***, critically***acutely ill, lying in bed intubated & sedated requiring mechanical ventilation *** NAD HEENT: MM pink/moist, anicteric***, atraumatic, neck supple Neuro: A&O x *** commands, PERRL *** , MAE CV: s1s2 ***RRR, *** on monitor, no r/m/g Pulm: Regular, non labored on *** , breath sounds ***-BUL & ***-BLL GI: soft, ***, non***tender, bs x 4 GU: foley in place *** with clear yellow urine Skin: *** no rashes/lesions noted Extremities: warm/dry, pulses + 2 R/P, *** edema noted  Resolved problem list   Assessment and Plan  High Anion gap metabolic acidosis ***     Labs   CBC: Recent Labs  Lab 09/26/23 2023  WBC 11.4*  HGB 14.9  HCT 45.9  MCV 98.5  PLT 173    Basic Metabolic Panel: Recent Labs  Lab 09/26/23 2023  NA 138  K 3.6  CL 95*  CO2 14*  GLUCOSE 83  BUN 9  CREATININE 0.91  CALCIUM  8.7*   GFR: Estimated Creatinine Clearance: 38.5 mL/min (by C-G formula based on SCr of 0.91 mg/dL). Recent Labs  Lab 09/26/23 2023  WBC 11.4*    Liver Function Tests: Recent Labs  Lab 09/26/23 2023  AST 207*  ALT 68*  ALKPHOS 137*  BILITOT 1.3*  PROT 7.3  ALBUMIN 4.0   No results for input(s): LIPASE, AMYLASE in the last 168 hours. No results for input(s): AMMONIA in the last 168 hours.  ABG    Component Value Date/Time  HCO3 16.8 (L) 09/26/2023 2038   ACIDBASEDEF 14.4 (H) 09/26/2023 2038   O2SAT 38.1 09/26/2023 2038     Coagulation Profile: No results for input(s): INR, PROTIME in the last 168 hours.  Cardiac Enzymes: No results for input(s): CKTOTAL, CKMB, CKMBINDEX, TROPONINI in the last 168 hours.  HbA1C: No results found for: HGBA1C  CBG: No results for input(s): GLUCAP in the last 168 hours.  Review of Systems: positives in BOLD***  Gen: Denies fever, chills, weight  change, fatigue, night sweats HEENT: Denies blurred vision, double vision, hearing loss, tinnitus, sinus congestion, rhinorrhea, sore throat, neck stiffness, dysphagia PULM: Denies shortness of breath, cough, sputum production, hemoptysis, wheezing CV: Denies chest pain, edema, orthopnea, paroxysmal nocturnal dyspnea, palpitations GI: Denies abdominal pain, nausea, vomiting, diarrhea, hematochezia, melena, constipation, change in bowel habits GU: Denies dysuria, hematuria, polyuria, oliguria, urethral discharge Endocrine: Denies hot or cold intolerance, polyuria, polyphagia or appetite change Derm: Denies rash, dry skin, scaling or peeling skin change Heme: Denies easy bruising, bleeding, bleeding gums Neuro: Denies headache, numbness, weakness, slurred speech, loss of memory or consciousness  Past Medical History:  She,  has a past medical history of Cataract and Depression.   Surgical History:   Past Surgical History:  Procedure Laterality Date   ABDOMINAL HYSTERECTOMY     CHOLECYSTECTOMY     ORIF HIP FRACTURE Right 12/20/2022   Procedure: OPEN REDUCTION INTERNAL FIXATION HIP;  Surgeon: Bari Valora HERO, MD;  Location: [facility] ORS;  Service: Orthopedics;  Laterality: Right;  intermediate TFN ( Synthes) with C- arm  and asssistant     Social History:   reports that she has been smoking cigarettes. She started smoking about 51 years ago. She has a 102.5 pack-year smoking history. She has never used smokeless tobacco. She reports current alcohol  use of about 35.0 standard drinks of alcohol  per week. She reports that she does not use drugs.   Family History:  Her family history is not on file.   Allergies Allergies  Allergen Reactions   Codeine Other (See Comments)    GI Upset     Home Medications  Prior to Admission medications   Medication Sig Start Date End Date Taking? Authorizing Provider  amLODipine  (NORVASC ) 2.5 MG tablet Take 1 tablet (2.5 mg total) by mouth daily. 04/04/23   Yes Simmons-Robinson, Makiera, MD  cetirizine  (ZYRTEC ) 10 MG tablet Take 1 tablet (10 mg total) by mouth daily. 04/04/23  Yes Simmons-Robinson, Makiera, MD  metoprolol  tartrate (LOPRESSOR ) 25 MG tablet Take 0.5 tablets (12.5 mg total) by mouth 2 (two) times daily. 04/04/23  Yes Simmons-Robinson, Makiera, MD  acetaminophen  (TYLENOL ) 500 MG tablet Take 500-1,000 mg by mouth every 6 (six) hours as needed for mild pain or moderate pain.    [provider]  feeding supplement (ENSURE ENLIVE / ENSURE PLUS) LIQD Take 237 mLs by mouth 3 (three) times daily between meals. 12/26/22   Fausto Sor A, DO  folic acid  (FOLVITE ) 1 MG tablet Take 1 tablet (1 mg total) by mouth daily. Patient not taking: Reported on 05/02/2023 01/21/23   Simmons-Robinson, Rockie, MD  ibuprofen (ADVIL) 200 MG tablet Take 400-600 mg by mouth every 6 (six) hours as needed for mild pain or moderate pain.    [provider]  Magnesium  400 MG CAPS Take 2 capsules by mouth 1 day or 1 dose.    [provider]  Multiple Vitamins-Minerals (CENTRUM ADULT 50+ MULTIGUMMIES) CHEW Chew 1 Dose by mouth 1 day or 1 dose.  [provider]  thiamine  (VITAMIN B-1) 100 MG tablet Take 1 tablet (100 mg total) by mouth daily. Patient not taking: Reported on 05/02/2023 01/21/23   Sharma Coyer, MD     Critical care time: ***        Jenita Ruth Rust-Chester, AGACNP-BC Acute Care Nurse Practitioner [facility]   [phone] / [phone] Please see Amion for details.

## 2023-09-26 NOTE — ED Triage Notes (Signed)
 Pt presents for dizziness. CBG 86. Stroke screen negative. Denies recent falls. No recent medication changes.

## 2023-09-26 NOTE — ED Provider Notes (Signed)
 [facility] Provider Note    Event Date/Time   First MD Initiated Contact with Patient 09/26/23 2021     (approximate)   History   Dizziness   HPI  Jill Hayes is a 73 y.o. female  HTN, previous CVA, smoking, prior history of alcohol  dependence supposedly in remission who presents with lightheadedness and general weakness.  Patient arrives via EMS with a call out secondary to lightheadedness.  History is difficult to obtain from the patient as she seems very confused is a poor historian.  She does tell me that she was feeling well yesterday went to bed at 8 PM but when she awoke today felt lightheaded and generally weak.  She denies any lightheadedness while sitting in bed.  She has not had any falls.  Denies any sensation changes or weakness in any of extremities.  Denies any chest pain abdominal pain or changes in urinary or bowel habits      Physical Exam   Triage Vital Signs: ED Triage Vitals  Encounter Vitals Group     BP --      Girls Systolic BP Percentile --      Girls Diastolic BP Percentile --      Boys Systolic BP Percentile --      Boys Diastolic BP Percentile --      Pulse Rate 09/26/23 2021 (!) 101     Resp 09/26/23 2021 16     Temp 09/26/23 2021 97.8 F (36.6 C)     Temp Source 09/26/23 2021 Oral     SpO2 09/26/23 2021 99 %     Weight 09/26/23 2018 99 lb 3.3 oz (45 kg)     Height 09/26/23 2018 5' 4 (1.626 m)     Head Circumference --      Peak Flow --      Pain Score 09/26/23 2013 0     Pain Loc --      Pain Education --      Exclude from Growth Chart --     Most recent vital signs: Vitals:   09/26/23 2021 09/26/23 2130  BP:  (!) 159/84  Pulse: (!) 101 (!) 102  Resp: 16 20  Temp: 97.8 F (36.6 C)   SpO2: 99% 99%    Nursing Triage Note reviewed. Vital signs reviewed and patients oxygen saturation is normoxic  General: Patient is very thin and tremulous Head: Normocephalic and atraumatic Eyes: Normal inspection,  extraocular muscles intact, no conjunctival pallor Ear, nose, throat: Normal external exam Neck: Normal range of motion Respiratory: Patient is in no respiratory distress, lungs mild wheezes Cardiovascular: Patient is not tachycardic, RRR without murmur appreciated GI: Abd SNT with no guarding or rebound  Back: Normal inspection of the back with good strength and range of motion throughout all ext Extremities: pulses intact with good cap refills, no LE pitting edema or calf tenderness Neuro: The patient is alert and oriented to person, place, and and time but easily confused with 5/5 bilat UE/LE strength, no gross motor or sensory defects noted. Coordination appears to be adequate.  Does have tremors Skin: Warm, dry, and intact   ED Results / Procedures / Treatments   Labs (all labs ordered are listed, but only abnormal results are displayed) Labs Reviewed  COMPREHENSIVE METABOLIC PANEL WITH GFR - Abnormal; Notable for the following components:      Result Value   Chloride 95 (*)    CO2 14 (*)    Calcium  8.7 (*)  AST 207 (*)    ALT 68 (*)    Alkaline Phosphatase 137 (*)    Total Bilirubin 1.3 (*)    Anion gap 29 (*)    All other components within normal limits  CBC - Abnormal; Notable for the following components:   WBC 11.4 (*)    All other components within normal limits  URINALYSIS, ROUTINE W REFLEX MICROSCOPIC - Abnormal; Notable for the following components:   Color, Urine YELLOW (*)    APPearance CLEAR (*)    Ketones, ur 20 (*)    Leukocytes,Ua LARGE (*)    Bacteria, UA RARE (*)    Non Squamous Epithelial PRESENT (*)    All other components within normal limits  BLOOD GAS, VENOUS - Abnormal; Notable for the following components:   pH, Ven 7.04 (*)    pCO2, Ven 62 (*)    pO2, Ven <31 (*)    Bicarbonate 16.8 (*)    Acid-base deficit 14.4 (*)    All other components within normal limits  LACTIC ACID, PLASMA - Abnormal; Notable for the following components:   Lactic  Acid, Venous 7.8 (*)    All other components within normal limits  ETHANOL - Abnormal; Notable for the following components:   Alcohol , Ethyl (B) 42 (*)    All other components within normal limits  OSMOLALITY - Abnormal; Notable for the following components:   Osmolality 307 (*)    All other components within normal limits  GLUCOSE, CAPILLARY - Abnormal; Notable for the following components:   Glucose-Capillary 61 (*)    All other components within normal limits  TROPONIN I (HIGH SENSITIVITY) - Abnormal; Notable for the following components:   Troponin I (High Sensitivity) 18 (*)    All other components within normal limits  TROPONIN I (HIGH SENSITIVITY) - Abnormal; Notable for the following components:   Troponin I (High Sensitivity) 19 (*)    All other components within normal limits  CULTURE, BLOOD (ROUTINE X 2)  CULTURE, BLOOD (ROUTINE X 2)  MRSA NEXT GEN BY PCR, NASAL  BRAIN NATRIURETIC PEPTIDE  URINE DRUG SCREEN, QUALITATIVE ([facility] ONLY)  LACTIC ACID, PLASMA  BETA-HYDROXYBUTYRIC ACID  CBC  MAGNESIUM   PHOSPHORUS  COMPREHENSIVE METABOLIC PANEL WITH GFR  MAGNESIUM   PHOSPHORUS  BLOOD GAS, ARTERIAL  CBG MONITORING, ED     EKG EKG and rhythm strip are interpreted by myself:   EKG: [Normal sinus rhythm] at heart rate of 99, normal QRS duration, QTc 425, nonspecific ST segments and T waves no ectopy EKG not consistent with Acute STEMI Rhythm strip: NSR in lead II   RADIOLOGY CT head: Without acute abnormality on my independent review interpretation Chest x-ray: No acute abnormality on my independent review interpretation CT abdomen pelvis : No acute abnormality on my note by review interpretation    PROCEDURES:  Critical Care performed: Yes, see critical care procedure note(s)  .Critical Care  Performed by: Nicholaus Rolland BRAVO, MD Authorized by: Nicholaus Rolland BRAVO, MD   Critical care provider statement:    Critical care time (minutes):  30   Critical care was  necessary to treat or prevent imminent or life-threatening deterioration of the following conditions:  Dehydration and endocrine crisis   Critical care was time spent personally by me on the following activities:  Development of treatment plan with patient or surrogate, discussions with consultants, evaluation of patient's response to treatment, examination of patient, ordering and review of laboratory studies, ordering and review of radiographic studies, ordering and performing  treatments and interventions, pulse oximetry, re-evaluation of patient's condition and review of old charts   Care discussed with: admitting provider   Comments:     Severe acidosis with a pH of 7.0    MEDICATIONS ORDERED IN ED: Medications  Chlorhexidine  Gluconate Cloth 2 % PADS 6 each (has no administration in time range)  docusate sodium  (COLACE) capsule 100 mg (has no administration in time range)  polyethylene glycol (MIRALAX  / GLYCOLAX ) packet 17 g (has no administration in time range)  enoxaparin  (LOVENOX ) injection 30 mg (has no administration in time range)  sodium bicarbonate  150 mEq in dextrose  5 % 1,150 mL infusion (has no administration in time range)  sodium chloride  0.9 % bolus 500 mL (0 mLs Intravenous Stopped 09/26/23 10-17-31)  cefTRIAXone  (ROCEPHIN ) 2 g in sodium chloride  0.9 % 100 mL IVPB (0 g Intravenous Stopped 09/26/23 10-17-2231)  iohexol  (OMNIPAQUE ) 300 MG/ML solution 80 mL (80 mLs Intravenous Contrast Given 09/26/23 17-Oct-2155)     IMPRESSION / MDM / ASSESSMENT AND PLAN / ED COURSE                                Differential diagnosis includes, but is not limited to, intracranial hemorrhage, orthostasis, UTI, anemia, arrhythmia, less likely CVA   ED course: Patient presents for lightheadedness however has no focal neurological deficits but is very confused consistent with a metabolic encephalopathy.  History is very limited as patient is tangential and unable to contribute much.  CT head demonstrated no  intracranial hemorrhage.  Urinalysis was consistent with UTI but she did not have a leukocytosis.  Ceftriaxone  was given.  It was noted that patient did have a very large anion gap and given her confusion a VBG was obtained which demonstrated a pH of 7.0 of unclear cause.  She did have elevated LFTs but abdominal exam was benign.  CT imaging did not reveal a cause for this.  Ethanol level was slightly elevated.  Given patient's profound acidosis, case discussed with ICU for admission   Clinical Course as of 09/26/23 2335/10/17  Thu Sep 26, 2023  2118 Leukocytes,Ua(!): LARGE [HD]  16-Oct-2116 Urinalysis, Routine w reflex microscopic -Urine, Clean Catch(!) I think this is consistent with UTI and will provide ceftriaxone  [HD]  2116-10-16 Creatinine: 0.91 No AKI [HD]  10-16-2133 Blood gas, venous(!!) Unclear cause for the acidosis as I do not think CO2 explains this [HD]  2229/10/16 Case discussed with ICU given VBG [HD]    Clinical Course User Index [HD] Nicholaus Rolland BRAVO, MD   Data(2/3 categories following were performed): I reviewed or ordered at least three unique tests, external notes, and/or the history required an independent historian as one of the three requirements as following: At least 3 labs/imaging studies were obtained and/or reviewed. AND  I discussed the management of the patient with the following external physician or qualified healthcare provider: Admitting physician  Risk: This patient has a high risk of morbidity due to further diagnostic testing or treatment. Rationale: Decision made regarding admission  Admit Level 5 - Labs/Rads, Admit, Consult:  Suggested E/M Coding Level: 5, 99285  This level has been selected based on the October 16, 2021 CPT guidelines for E/M codes in the Emergency Department based on 2/3 of the CoPA, Data, and Risk.    FINAL CLINICAL IMPRESSION(S) / ED DIAGNOSES   Final diagnoses:  Acidosis  Lightheadedness  Alcohol  use     Rx / DC Orders  ED Discharge Orders     None         Note:  This document was prepared using Dragon voice recognition software and may include unintentional dictation errors.   Nicholaus Rolland BRAVO, MD 09/26/23 [phone]

## 2023-09-27 ENCOUNTER — Inpatient Hospital Stay

## 2023-09-27 DIAGNOSIS — E8721 Acute metabolic acidosis: Secondary | ICD-10-CM | POA: Diagnosis not present

## 2023-09-27 LAB — LACTIC ACID, PLASMA
Lactic Acid, Venous: 1.2 mmol/L (ref 0.5–1.9)
Lactic Acid, Venous: 1.8 mmol/L (ref 0.5–1.9)
Lactic Acid, Venous: 2.3 mmol/L (ref 0.5–1.9)
Lactic Acid, Venous: 4.7 mmol/L (ref 0.5–1.9)
Lactic Acid, Venous: 7.4 mmol/L (ref 0.5–1.9)

## 2023-09-27 LAB — URINALYSIS, W/ REFLEX TO CULTURE (INFECTION SUSPECTED)
Bacteria, UA: NONE SEEN
Bilirubin Urine: NEGATIVE
Glucose, UA: NEGATIVE mg/dL
Hgb urine dipstick: NEGATIVE
Ketones, ur: 20 mg/dL — AB
Nitrite: NEGATIVE
Protein, ur: NEGATIVE mg/dL
Specific Gravity, Urine: 1.008 (ref 1.005–1.030)
pH: 6 (ref 5.0–8.0)

## 2023-09-27 LAB — BETA-HYDROXYBUTYRIC ACID
Beta-Hydroxybutyric Acid: 4.37 mmol/L — ABNORMAL HIGH (ref 0.05–0.27)
Beta-Hydroxybutyric Acid: 4.68 mmol/L — ABNORMAL HIGH (ref 0.05–0.27)
Beta-Hydroxybutyric Acid: 2.18 mmol/L — ABNORMAL HIGH (ref 0.05–0.27)

## 2023-09-27 LAB — CBC
HCT: 41.7 % (ref 36.0–46.0)
Hemoglobin: 14 g/dL (ref 12.0–15.0)
MCH: 31.8 pg (ref 26.0–34.0)
MCHC: 33.6 g/dL (ref 30.0–36.0)
MCV: 94.8 fL (ref 80.0–100.0)
Platelets: 142 K/uL — ABNORMAL LOW (ref 150–400)
RBC: 4.4 MIL/uL (ref 3.87–5.11)
RDW: 15.1 % (ref 11.5–15.5)
WBC: 7.1 K/uL (ref 4.0–10.5)
nRBC: 0 % (ref 0.0–0.2)

## 2023-09-27 LAB — BASIC METABOLIC PANEL WITH GFR
Anion gap: 13 (ref 5–15)
BUN: 6 mg/dL — ABNORMAL LOW (ref 8–23)
CO2: 28 mmol/L (ref 22–32)
Calcium: 7.9 mg/dL — ABNORMAL LOW (ref 8.9–10.3)
Chloride: 94 mmol/L — ABNORMAL LOW (ref 98–111)
Creatinine, Ser: 0.52 mg/dL (ref 0.44–1.00)
GFR, Estimated: >60 mL/min (ref >60)
Glucose, Bld: 130 mg/dL — ABNORMAL HIGH (ref 70–99)
Potassium: 3.9 mmol/L (ref 3.5–5.1)
Sodium: 135 mmol/L (ref 135–145)

## 2023-09-27 LAB — PHOSPHORUS
Phosphorus: 2.7 mg/dL (ref 2.5–4.6)
Phosphorus: 4.7 mg/dL — ABNORMAL HIGH (ref 2.5–4.6)

## 2023-09-27 LAB — PROTIME-INR
INR: 1.1 (ref 0.8–1.2)
Prothrombin Time: 14.4 s (ref 11.4–15.2)

## 2023-09-27 LAB — COMPREHENSIVE METABOLIC PANEL WITH GFR
ALT: 66 U/L — ABNORMAL HIGH (ref 0–44)
AST: 199 U/L — ABNORMAL HIGH (ref 15–41)
Albumin: 3.6 g/dL (ref 3.5–5.0)
Alkaline Phosphatase: 126 U/L (ref 38–126)
Anion gap: 21 — ABNORMAL HIGH (ref 5–15)
BUN: 7 mg/dL — ABNORMAL LOW (ref 8–23)
CO2: 24 mmol/L (ref 22–32)
Calcium: 7.8 mg/dL — ABNORMAL LOW (ref 8.9–10.3)
Chloride: 91 mmol/L — ABNORMAL LOW (ref 98–111)
Creatinine, Ser: 0.78 mg/dL (ref 0.44–1.00)
GFR, Estimated: >60 mL/min (ref >60)
Glucose, Bld: 161 mg/dL — ABNORMAL HIGH (ref 70–99)
Potassium: 4 mmol/L (ref 3.5–5.1)
Sodium: 136 mmol/L (ref 135–145)
Total Bilirubin: 1.3 mg/dL — ABNORMAL HIGH (ref 0.0–1.2)
Total Protein: 6.7 g/dL (ref 6.5–8.1)

## 2023-09-27 LAB — BLOOD GAS, VENOUS
Bicarbonate: 16.8 mmol/L — ABNORMAL LOW (ref 20.0–28.0)
O2 Saturation: 38.1 mmol/L — AB (ref 0.0–2.0)
Patient temperature: 37
Patient temperature: 38.1
pCO2, Ven: 62 mmHg — ABNORMAL HIGH (ref 44–60)
pH, Ven: 7.04 — CL (ref 7.25–7.43)
pO2, Ven: <31 mmol/L — CL (ref 32–45)

## 2023-09-27 LAB — GLUCOSE, CAPILLARY
Glucose-Capillary: 116 mg/dL — ABNORMAL HIGH (ref 70–99)
Glucose-Capillary: 130 mg/dL — ABNORMAL HIGH (ref 70–99)
Glucose-Capillary: 150 mg/dL — ABNORMAL HIGH (ref 70–99)
Glucose-Capillary: 163 mg/dL — ABNORMAL HIGH (ref 70–99)
Glucose-Capillary: 185 mg/dL — ABNORMAL HIGH (ref 70–99)
Glucose-Capillary: 222 mg/dL — ABNORMAL HIGH (ref 70–99)

## 2023-09-27 LAB — MAGNESIUM
Magnesium: 1.2 mg/dL — ABNORMAL LOW (ref 1.7–2.4)
Magnesium: 3 mg/dL — ABNORMAL HIGH (ref 1.7–2.4)

## 2023-09-27 LAB — PROCALCITONIN: Procalcitonin: 0.29 ng/mL

## 2023-09-27 LAB — AMMONIA: Ammonia: <13 umol/L (ref 9–35)

## 2023-09-27 LAB — MRSA NEXT GEN BY PCR, NASAL: MRSA by PCR Next Gen: NOT DETECTED

## 2023-09-27 MED ORDER — LACTATED RINGERS IV BOLUS
1000.0000 mL | Freq: Once | INTRAVENOUS | Status: AC
  Administered 2023-09-27: 1000 mL via INTRAVENOUS

## 2023-09-27 MED ORDER — HYDRALAZINE HCL 20 MG/ML IJ SOLN
10.0000 mg | INTRAMUSCULAR | Status: DC | PRN
  Administered 2023-09-27: 10 mg via INTRAVENOUS
  Filled 2023-09-27: qty 1

## 2023-09-27 MED ORDER — LORAZEPAM 2 MG/ML IJ SOLN: 1.0000 mg | INTRAMUSCULAR | Status: DC | PRN

## 2023-09-27 MED ORDER — THIAMINE HCL 100 MG/ML IJ SOLN: 100.0000 mg | INTRAMUSCULAR | Status: DC

## 2023-09-27 MED ORDER — THIAMINE HCL 100 MG/ML IJ SOLN
100.0000 mg | Freq: Once | INTRAMUSCULAR | Status: AC
  Administered 2023-09-27: 100 mg via INTRAVENOUS

## 2023-09-27 MED ORDER — FOLIC ACID 1 MG PO TABS
1.0000 mg | ORAL_TABLET | Freq: Every day | ORAL | Status: DC
  Administered 2023-09-28 – 2023-09-29 (×2): 1 mg via ORAL
  Filled 2023-09-27 (×3): qty 1

## 2023-09-27 MED ORDER — GLUCERNA SHAKE PO LIQD
237.0000 mL | Freq: Three times a day (TID) | ORAL | Status: DC
  Administered 2023-09-27: 237 mL via ORAL

## 2023-09-27 MED ORDER — INSULIN ASPART 100 UNIT/ML IJ SOLN
0.0000 [IU] | INTRAMUSCULAR | Status: DC
  Administered 2023-09-27: 3 [IU] via SUBCUTANEOUS
  Administered 2023-09-28: 1 [IU] via SUBCUTANEOUS
  Filled 2023-09-27 (×2): qty 1

## 2023-09-27 MED ORDER — POLYVINYL ALCOHOL 1.4 % OP SOLN
1.0000 [drp] | OPHTHALMIC | Status: DC | PRN
  Filled 2023-09-27: qty 15

## 2023-09-27 MED ORDER — LACTATED RINGERS IV SOLN: INTRAVENOUS | Status: DC

## 2023-09-27 MED ORDER — DEXTROSE 50 % IV SOLN
12.5000 g | INTRAVENOUS | Status: AC
  Administered 2023-09-27: 12.5 g via INTRAVENOUS

## 2023-09-27 MED ORDER — MAGNESIUM SULFATE 4 GM/100ML IV SOLN
4.0000 g | Freq: Once | INTRAVENOUS | Status: AC
  Administered 2023-09-27: 4 g via INTRAVENOUS
  Filled 2023-09-27: qty 100

## 2023-09-27 MED ORDER — NICOTINE 21 MG/24HR TD PT24
21.0000 mg | MEDICATED_PATCH | Freq: Every day | TRANSDERMAL | Status: DC
  Administered 2023-09-27 – 2023-09-29 (×3): 21 mg via TRANSDERMAL
  Filled 2023-09-27 (×3): qty 1

## 2023-09-27 MED ORDER — THIAMINE HCL 100 MG/ML IJ SOLN
500.0000 mg | INTRAVENOUS | Status: DC
  Administered 2023-09-27 – 2023-09-28 (×2): 500 mg via INTRAVENOUS
  Filled 2023-09-27 (×3): qty 5

## 2023-09-27 MED ORDER — ENOXAPARIN SODIUM 40 MG/0.4ML IJ SOSY
40.0000 mg | PREFILLED_SYRINGE | Freq: Every day | INTRAMUSCULAR | Status: DC
  Filled 2023-09-27 (×2): qty 0.4

## 2023-09-27 MED ORDER — SODIUM CHLORIDE 0.9 % IV SOLN
2.0000 g | INTRAVENOUS | Status: DC
  Administered 2023-09-27 – 2023-09-28 (×2): 2 g via INTRAVENOUS
  Filled 2023-09-27 (×3): qty 20

## 2023-09-27 MED ORDER — ACETAMINOPHEN 325 MG PO TABS
650.0000 mg | ORAL_TABLET | Freq: Four times a day (QID) | ORAL | Status: DC | PRN
  Administered 2023-09-27: 650 mg via ORAL
  Filled 2023-09-27: qty 2

## 2023-09-27 MED ORDER — ENSURE PLUS HIGH PROTEIN PO LIQD
237.0000 mL | Freq: Two times a day (BID) | ORAL | Status: DC
  Administered 2023-09-28 – 2023-09-29 (×3): 237 mL via ORAL

## 2023-09-27 MED ORDER — CHLORHEXIDINE GLUCONATE CLOTH 2 % EX PADS
6.0000 | MEDICATED_PAD | Freq: Every day | CUTANEOUS | Status: DC
  Administered 2023-09-27: 6 via TOPICAL

## 2023-09-27 NOTE — Plan of Care (Signed)
  Problem: Education: Goal: Knowledge of General Education information will improve Description: Including pain rating scale, medication(s)/side effects and non-pharmacologic comfort measures Outcome: Progressing   Problem: Clinical Measurements: Goal: Ability to maintain clinical measurements within normal limits will improve Outcome: Progressing Goal: Respiratory complications will improve Outcome: Progressing Goal: Cardiovascular complication will be avoided Outcome: Progressing   Problem: Nutrition: Goal: Adequate nutrition will be maintained Outcome: Progressing   Problem: Elimination: Goal: Will not experience complications related to bowel motility Outcome: Progressing Goal: Will not experience complications related to urinary retention Outcome: Progressing

## 2023-09-27 NOTE — Progress Notes (Addendum)
 Initial Nutrition Assessment  DOCUMENTATION CODES:   Severe malnutrition in context of social or environmental circumstances  INTERVENTION:   Ensure Plus High Protein po BID, each supplement provides 350 kcal and 20 grams of protein  MVI, folic acid  and thiamine  po daily   Pt at high refeed risk; recommend monitor potassium, magnesium  and phosphorus labs daily until stable  Daily weights   NUTRITION DIAGNOSIS:   Severe Malnutrition related to social / environmental circumstances as evidenced by severe fat depletion, severe muscle depletion.  GOAL:   Patient will meet greater than or equal to 90% of their needs  MONITOR:   PO intake, Supplement acceptance, Labs, Weight trends, I & O's, Skin  REASON FOR ASSESSMENT:   Rounds    ASSESSMENT:   73 y/o female with h/o etoh abuse, CVA, anxiety, depression and HTN who is admitted with FTT and etoh withdrawal.  Met with pt in room today. Pt reports poor appetite and oral intake at baseline. Pt reports that she has never been a big eater. Pt reports snacking throughout the day. Pt reports that she used to drink Ensure but reports that she stopped drinking it as she was told it was too high in sugar. Pt reporting that she is hungry today. RD discussed with pt the importance of adequate nutrition needed to preserve lean muscle. Pt is agreeable to drink chocolate supplements. RD will add supplements to help pt meet her estimated needs. Pt is at high refeeed risk. Pt is unsure if she has had any recent weight loss. Per chart, pt appears weight stable pta.   Of note, pt reports intermittent diarrhea at home that occurs about every 4 days.   Medications reviewed and include: lovenox , MVI, nicotine , thiamine , ceftriaxone   Labs reviewed: K 4.0 wnl, BUN 7(L), P 2.7 wnl, Mg 3.0(H) Cbgs- 116, 150, 163, 185 x 24 hrs   NUTRITION - FOCUSED PHYSICAL EXAM:  Flowsheet Row Most Recent Value  Orbital Region Mild depletion  Upper Arm Region Severe  depletion  Thoracic and Lumbar Region Severe depletion  Buccal Region Mild depletion  Temple Region Mild depletion  Clavicle Bone Region Severe depletion  Clavicle and Acromion Bone Region Severe depletion  Scapular Bone Region Moderate depletion  Dorsal Hand Moderate depletion  Patellar Region Severe depletion  Anterior Thigh Region Severe depletion  Posterior Calf Region Severe depletion  Edema (RD Assessment) None  Hair Reviewed  Eyes Reviewed  Mouth Reviewed  Skin Reviewed  Nails Reviewed   Diet Order:   Diet Order             Diet regular Room service appropriate? Yes; Fluid consistency: Thin  Diet effective now                  EDUCATION NEEDS:   Education needs have been addressed  Skin:  Skin Assessment: Reviewed RN Assessment  Last BM:  8/29- type 4  Height:   Ht Readings from Last 1 Encounters:  09/26/23 5' 4 (1.626 m)    Weight:   Wt Readings from Last 1 Encounters:  09/26/23 49.4 kg    Ideal Body Weight:  54.5 kg  BMI:  Body mass index is 18.69 kg/m.  Estimated Nutritional Needs:   Kcal:  1300-1500kcal/day  Protein:  65-75g/day  Fluid:  1.2-1.4L/day  Augustin Shams MS, RD, LDN If unable to be reached, please send secure chat to RD inpatient available from 8:00a-4:00p daily

## 2023-09-27 NOTE — Consult Note (Signed)
 PHARMACY CONSULT NOTE - ELECTROLYTES  Pharmacy Consult for Electrolyte Monitoring and Replacement   Recent Labs: Potassium (mmol/L)  Date Value  09/27/2023 4.0   Magnesium  (mg/dL)  Date Value  91/70/7974 3.0 (H)   Calcium  (mg/dL)  Date Value  91/70/7974 7.8 (L)   Albumin (g/dL)  Date Value  91/70/7974 3.6  05/02/2023 4.0   Phosphorus (mg/dL)  Date Value  91/70/7974 2.7   Sodium (mmol/L)  Date Value  09/27/2023 136  05/02/2023 137   Corrected Ca: 8.1 mg/dL  Height: 5' 4 (837.3 cm) Weight: 49.4 kg (108 lb 14.5 oz) IBW/kg (Calculated) : 54.7 Estimated Creatinine Clearance: 48.1 mL/min (by C-G formula based on SCr of 0.78 mg/dL).  Assessment  Jill Hayes is a 73 y.o. female presenting with alcoholic ketoacidosis. PMH significant for alcohol  use disorder (last drink 8/27 PM), CVA, tobacco use disorder (> 50 pack years), HTN. Pharmacy has been consulted to monitor and replace electrolytes.  Diet: NPO MIVF: N/A Pertinent medications: N/A  Goal of Therapy: Electrolytes within normal limits  Plan:  No supplementation recommended at this time  Monitor electrolytes daily   Thank you for allowing pharmacy to be a part of this patient's care.  Elijan Googe [place], PharmD Candidate  09/27/2023 8:00 AM

## 2023-09-27 NOTE — Progress Notes (Signed)
 NAME:  Jill Hayes, MRN:  [id number], DOB:  1950-05-19, LOS: 1 ADMISSION DATE:  09/26/2023, CONSULTATION DATE:  09/26/2023 REFERRING MD:  Dr. Nicholaus, CHIEF COMPLAINT:  Dizziness   Brief Pt Description / Synopsis:  73 y.o. female with PMHx significant for ETOH abuse admitted with Alcoholic Ketoacidosis, UTI, and developing alcohol  withdrawal.   History of Present Illness:  73 yo F presenting to [facility] ED from home for evaluation of dizziness.   History obtained per chart review and patient bedside report. Patient reports being in her normal state of health until 09/25/23 in the evening when she began feeling unwell, she describes exhaustion and some dizziness before bed. This progressed on 09/26/23 and she began feeling sick to her stomach without abdominal pain or vomiting. She also endorses increased thirst. She denies dyspnea, cough, diarrhea, fever/chills, dysuria/ polyuria, blurred vision, headache, falls. She initially stated she was a recovering alcoholic when her daughter was bedside. Then, when alone endorsed her last drink of vodka was in the evening of 8/27. She reports staring to consume ETOH regularly about 6 months ago, and drinks 16 oz daily. She endorses daily tobacco use, a pack daily and denies recreational drug use.   ED course: Upon arrival patient a poor historian with daughter bedside, tachycardic and hypertensive. Labs significant for AGMA, hypochloremia, Transaminitis, elevated osmolality, elevated beta-hydroxy, leukocytosis and lactic acidosis. Imaging revealed fatty liver, sinus disease and chronic infarcts.  Medications given: Rocephin , 500 mL bolus, IV contrast Initial Vitals: 97.8, 20, 102, 159/84 & 99% on RA  Significant labs: (Labs/ Imaging personally reviewed) I, Jenita Ruth Rust-Chester, AGACNP-BC, personally viewed and interpreted this ECG. EKG Interpretation: Date: 09/26/23, EKG Time: 20:24, Rate: 99, Rhythm: NSR, QRS Axis: normal, Intervals: normal, ST/T Wave  abnormalities: none, Narrative Interpretation: NSR Chemistry: Na+: 138, K+: 3.6, Cl: 95, BUN/Cr.: 9/ 0.91, Serum CO2/ AG: 14/29, alk phos: 137, AST/ALT: 207/68, T.Bili: 1.3 Hematology: WBC: 11.4, Hgb: 14.9,  Troponin: 18, BNP: 67.5, Lactic/ PCT: 7.8/ pending,    VBG: 7.04/ 62/ <31/ 16.8 CXR 09/26/23: no active cardiopulmonary disease CT head wo contrast 09/26/23: Generalized cerebral atrophy with chronic white matter small vessel ischemic changes. Bilateral chronic basal ganglia lacunar infarcts. Moderate to marked severity right maxillary sinus disease. CT abdomen/pelvis w contrast 09/26/23: fatty liver. No acute abnormality to correspond with the given clinical history.   PCCM consulted for admission due to high anion gap metabolic acidosis s/t alcoholic ketoacidosis and suspected sepsis s/t UTI requiring sodium bicarbonate  drip.  Please see Significant Hospital Events section below for full detailed hospital course.   Pertinent  Medical History  ETOH Cataract Depression Hip fracture HTN Current everyday smoker (> 50 pack year history) CVA  Micro Data:  8/29: Urine>> 8/28: Blood cultures x2>> no growth to date 8/28: MRSA PCR>> negative   Antimicrobials:   Anti-infectives (From admission, onward)    Start     Dose/Rate Route Frequency Ordered Stop   09/27/23 2100  cefTRIAXone  (ROCEPHIN ) 2 g in sodium chloride  0.9 % 100 mL IVPB        2 g 200 mL/hr over 30 Minutes Intravenous Every 24 hours 09/27/23 0059 10/01/23 2059   09/26/23 2130  cefTRIAXone  (ROCEPHIN ) 2 g in sodium chloride  0.9 % 100 mL IVPB        2 g 200 mL/hr over 30 Minutes Intravenous  Once 09/26/23 2118 09/26/23 2233       Significant Hospital Events: Including procedures, antibiotic start and stop dates in addition to other pertinent  events   09/26/23: Admit to ICU due to high anion gap metabolic acidosis s/t alcoholic ketoacidosis and suspected sepsis s/t UTI requiring sodium bicarbonate  drip. 09/27/23:  Alcoholic ketoacidosis improving, bicarb drip transitioned to maintenance IV fluids.  Starting to show signs of alcohol  withdraw as is becoming tremulous, Librium  taper initiated.   Interim History / Subjective:  As outlined above under Significant Hospital Events section  Objective   Blood pressure (!) 151/68, pulse 95, temperature 98.5 F (36.9 C), temperature source Oral, resp. rate (!) 23, height 5' 4 (1.626 m), weight 49.4 kg, SpO2 97%.        Intake/Output Summary (Last 24 hours) at 09/27/2023 0741 Last data filed at 09/27/2023 0600 Gross per 24 hour  Intake 3090.33 ml  Output 800 ml  Net 2290.33 ml   Filed Weights   09/26/23 2018 09/26/23 2340  Weight: 45 kg 49.4 kg    Examination: General: Adult female, acutely ill, sitting in bed, on room air, starting to become tremulous, but in NAD HEENT: MM pink/moist, anicteric, atraumatic, neck supple Neuro: A&O x 4, able to follow commands,  no focal deficits,  PERRL +3, MAE CV: s1s2 RRR, NSR on monitor, no r/m/g Pulm: Regular, non labored on RA , breath sounds clear-BUL & clear/diminished-BLL GI: soft, rounded, non tender, bs x 4 GU: external catheter in place with clear yellow urine Skin: scattered ecchymosis and purpura noted Extremities: warm/dry, pulses + 2 R/P, no edema noted  Resolved Hospital Problem list     Assessment & Plan:   #Anion Gap Metabolic Acidosis due to ... #Alcoholic Ketoacidosis -Monitor I&O's / urinary output -Follow BMP and Beta-hydroxybutyric acid  -Ensure adequate renal perfusion -Avoid nephrotoxic agents as able -Replace electrolytes as indicated ~ Pharmacy following for assistance with electrolyte replacement -Bicarb gtt discontinued ~ continue IV fluids  #UTI -Monitor fever curve -Trend WBC's & Procalcitonin -Follow cultures as above -Continue empiric Ceftiaxone pending cultures & sensitivities  #Hypertension - outpatient regimen on hold, consider restarting as patient stabilizes -  hydralazine  PRN to maintain SBP < 165   #Transaminitis secondary to fatty liver in the setting of ETOH abuse -RUQ Ultrasound with hepatic steatosis - Trend hepatic function - avoid hepatotoxic agents  #Acute Alcohol  Intoxication #Developing Alcohol  Withdrawal, high risk for severe DT's PMHx: ETOH abuse -Treatment of metabolic derangements as outlined above -Provide supportive care -Promote normal sleep/wake cycle and family presence -Avoid sedating medications as able -CIWA protocol -Librium  taper -If develops severe DT's will utilize Phenobarbital  -High dose Thiamine  x3 days, followed by 100 mcg daily -MVI and Thiamine          Best Practice (right click and Reselect all SmartList Selections daily)   Diet/type: Regular consistency (see orders) DVT prophylaxis: LMWH GI prophylaxis: N/A Lines: N/A Foley:  N/A Code Status:  full code Last date of multidisciplinary goals of care discussion [8/29]  8/29: Pt and her daughter updated at bedside on plan of care.  Labs   CBC: Recent Labs  Lab 09/26/23 2023 09/27/23 0341  WBC 11.4* 7.1  HGB 14.9 14.0  HCT 45.9 41.7  MCV 98.5 94.8  PLT 173 142*    Basic Metabolic Panel: Recent Labs  Lab 09/26/23 2023 09/26/23 2348 09/27/23 0341  NA 138  --  136  K 3.6  --  4.0  CL 95*  --  91*  CO2 14*  --  24  GLUCOSE 83  --  161*  BUN 9  --  7*  CREATININE 0.91  --  0.78  CALCIUM  8.7*  --  7.8*  MG  --  1.2* 3.0*  PHOS  --  4.7* 2.7   GFR: Estimated Creatinine Clearance: 48.1 mL/min (by C-G formula based on SCr of 0.78 mg/dL). Recent Labs  Lab 09/26/23 2023 09/26/23 2223 09/26/23 2348 09/27/23 0204 09/27/23 0341  PROCALCITON  --   --   --   --  0.29  WBC 11.4*  --   --   --  7.1  LATICACIDVEN  --  7.8* 7.4* 4.7* 2.3*    Liver Function Tests: Recent Labs  Lab 09/26/23 2023 09/27/23 0341  AST 207* 199*  ALT 68* 66*  ALKPHOS 137* 126  BILITOT 1.3* 1.3*  PROT 7.3 6.7  ALBUMIN 4.0 3.6   No results for  input(s): LIPASE, AMYLASE in the last 168 hours. Recent Labs  Lab 09/27/23 0341  AMMONIA <13    ABG    Component Value Date/Time   HCO3 27.2 09/27/2023 0341   ACIDBASEDEF 14.4 (H) 09/26/2023 2038   O2SAT 40.9 09/27/2023 0341     Coagulation Profile: Recent Labs  Lab 09/27/23 0341  INR 1.1    Cardiac Enzymes: No results for input(s): CKTOTAL, CKMB, CKMBINDEX, TROPONINI in the last 168 hours.  HbA1C: No results found for: HGBA1C  CBG: Recent Labs  Lab 09/26/23 2330 09/27/23 0047 09/27/23 0356  GLUCAP 61* 185* 163*    Review of Systems:   Positives in BOLD:  Gen: Denies fever, chills, weight change, fatigue/malaise, night sweats HEENT: Denies blurred vision, double vision, hearing loss, tinnitus, sinus congestion, rhinorrhea, sore throat, neck stiffness, dysphagia PULM: Denies shortness of breath, cough, sputum production, hemoptysis, wheezing CV: Denies chest pain, edema, orthopnea, paroxysmal nocturnal dyspnea, palpitations GI: Denies abdominal pain, nausea, vomiting, diarrhea, hematochezia, melena, constipation, change in bowel habits GU: Denies dysuria, hematuria, polyuria, oliguria, urethral discharge Endocrine: Denies hot or cold intolerance, polyuria, polyphagia or appetite change Derm: Denies rash, dry skin, scaling or peeling skin change Heme: Denies easy bruising, bleeding, bleeding gums Neuro: Denies headache, numbness, weakness, slurred speech, loss of memory or consciousness, tremors   Past Medical History:  She,  has a past medical history of Cataract and Depression.   Surgical History:   Past Surgical History:  Procedure Laterality Date   ABDOMINAL HYSTERECTOMY     CHOLECYSTECTOMY     ORIF HIP FRACTURE Right 12/20/2022   Procedure: OPEN REDUCTION INTERNAL FIXATION HIP;  Surgeon: Bari Valora HERO, MD;  Location: [facility] ORS;  Service: Orthopedics;  Laterality: Right;  intermediate TFN ( Synthes) with C- arm  and asssistant      Social History:   reports that she has been smoking cigarettes. She started smoking about 51 years ago. She has a 102.5 pack-year smoking history. She has never used smokeless tobacco. She reports current alcohol  use of about 35.0 standard drinks of alcohol  per week. She reports that she does not use drugs.   Family History:  Her family history is not on file.   Allergies Allergies  Allergen Reactions   Codeine Other (See Comments)    GI Upset     Home Medications  Prior to Admission medications   Medication Sig Start Date End Date Taking? Authorizing Provider  amLODipine  (NORVASC ) 2.5 MG tablet Take 1 tablet (2.5 mg total) by mouth daily. 04/04/23  Yes Simmons-Robinson, Makiera, MD  cetirizine  (ZYRTEC ) 10 MG tablet Take 1 tablet (10 mg total) by mouth daily. 04/04/23  Yes Simmons-Robinson, Makiera, MD  metoprolol  tartrate (LOPRESSOR ) 25 MG tablet  Take 0.5 tablets (12.5 mg total) by mouth 2 (two) times daily. 04/04/23  Yes Simmons-Robinson, Makiera, MD  acetaminophen  (TYLENOL ) 500 MG tablet Take 500-1,000 mg by mouth every 6 (six) hours as needed for mild pain or moderate pain.    [provider]  feeding supplement (ENSURE ENLIVE / ENSURE PLUS) LIQD Take 237 mLs by mouth 3 (three) times daily between meals. 12/26/22   Fausto Sor A, DO  folic acid  (FOLVITE ) 1 MG tablet Take 1 tablet (1 mg total) by mouth daily. Patient not taking: Reported on 05/02/2023 01/21/23   Simmons-Robinson, Rockie, MD  ibuprofen (ADVIL) 200 MG tablet Take 400-600 mg by mouth every 6 (six) hours as needed for mild pain or moderate pain.    [provider]  Magnesium  400 MG CAPS Take 2 capsules by mouth 1 day or 1 dose.    [provider]  Multiple Vitamins-Minerals (CENTRUM ADULT 50+ MULTIGUMMIES) CHEW Chew 1 Dose by mouth 1 day or 1 dose.    [provider]  thiamine  (VITAMIN B-1) 100 MG tablet Take 1 tablet (100 mg total) by mouth daily. Patient not taking: Reported on  05/02/2023 01/21/23   Sharma Rockie, MD     Critical care time: 40 minutes     Inge Lecher, AGACNP-BC [facility] Prefer epic messenger for cross cover needs If after hours, please call E-link

## 2023-09-27 NOTE — Progress Notes (Signed)
 eLink Physician-Brief Progress Note Patient Name: Jill Hayes DOB: 04-Jul-1950 MRN: [id number]   Date of Service  09/27/2023  HPI/Events of Note  Patient presented to ED with complaints of dizziness and was found to have high anion gap metabolic acidosis in the context of a positive blood alcohol  level, work up is in progress.  eICU Interventions  New Patient Evaluation.        Jill Hayes [name] 09/27/2023, 12:37 AM

## 2023-09-28 DIAGNOSIS — E8729 Other acidosis: Secondary | ICD-10-CM | POA: Diagnosis not present

## 2023-09-28 LAB — CBC
HCT: 40.5 % (ref 36.0–46.0)
Hemoglobin: 13.9 g/dL (ref 12.0–15.0)
MCH: 32 pg (ref 26.0–34.0)
MCHC: 34.3 g/dL (ref 30.0–36.0)
MCV: 93.3 fL (ref 80.0–100.0)
Platelets: 125 K/uL — ABNORMAL LOW (ref 150–400)
RBC: 4.34 MIL/uL (ref 3.87–5.11)
RDW: 14.7 % (ref 11.5–15.5)
WBC: 6 K/uL (ref 4.0–10.5)
nRBC: 0 % (ref 0.0–0.2)

## 2023-09-28 LAB — HEPATIC FUNCTION PANEL
ALT: 48 U/L — ABNORMAL HIGH (ref 0–44)
AST: 101 U/L — ABNORMAL HIGH (ref 15–41)
Albumin: 3.6 g/dL (ref 3.5–5.0)
Alkaline Phosphatase: 112 U/L (ref 38–126)
Bilirubin, Direct: 0.2 mg/dL (ref 0.0–0.2)
Indirect Bilirubin: 0.8 mg/dL (ref 0.3–0.9)
Total Bilirubin: 1 mg/dL (ref 0.0–1.2)
Total Protein: 6.9 g/dL (ref 6.5–8.1)

## 2023-09-28 LAB — GLUCOSE, CAPILLARY
Glucose-Capillary: 105 mg/dL — ABNORMAL HIGH (ref 70–99)
Glucose-Capillary: 102 mg/dL — ABNORMAL HIGH (ref 70–99)
Glucose-Capillary: 87 mg/dL (ref 70–99)
Glucose-Capillary: 107 mg/dL — ABNORMAL HIGH (ref 70–99)
Glucose-Capillary: 135 mg/dL — ABNORMAL HIGH (ref 70–99)
Glucose-Capillary: 141 mg/dL — ABNORMAL HIGH (ref 70–99)

## 2023-09-28 LAB — RENAL FUNCTION PANEL
Albumin: 3.4 g/dL — ABNORMAL LOW (ref 3.5–5.0)
Anion gap: 12 (ref 5–15)
BUN: 5 mg/dL — ABNORMAL LOW (ref 8–23)
CO2: 29 mmol/L (ref 22–32)
Calcium: 8.3 mg/dL — ABNORMAL LOW (ref 8.9–10.3)
Chloride: 95 mmol/L — ABNORMAL LOW (ref 98–111)
Creatinine, Ser: 0.66 mg/dL (ref 0.44–1.00)
GFR, Estimated: >60 mL/min (ref >60)
Glucose, Bld: 105 mg/dL — ABNORMAL HIGH (ref 70–99)
Phosphorus: 1.6 mg/dL — ABNORMAL LOW (ref 2.5–4.6)
Potassium: 3.3 mmol/L — ABNORMAL LOW (ref 3.5–5.1)
Sodium: 136 mmol/L (ref 135–145)

## 2023-09-28 LAB — PROCALCITONIN: Procalcitonin: 0.31 ng/mL

## 2023-09-28 LAB — MAGNESIUM: Magnesium: 1.9 mg/dL (ref 1.7–2.4)

## 2023-09-28 MED ORDER — POTASSIUM PHOSPHATES 15 MMOLE/5ML IV SOLN
30.0000 mmol | Freq: Once | INTRAVENOUS | Status: AC
  Administered 2023-09-28: 30 mmol via INTRAVENOUS
  Filled 2023-09-28: qty 10

## 2023-09-28 MED ORDER — POTASSIUM CHLORIDE CRYS ER 20 MEQ PO TBCR
20.0000 meq | EXTENDED_RELEASE_TABLET | Freq: Once | ORAL | Status: AC
  Administered 2023-09-28: 20 meq via ORAL
  Filled 2023-09-28: qty 1

## 2023-09-28 MED ORDER — INSULIN ASPART 100 UNIT/ML IJ SOLN
0.0000 [IU] | Freq: Three times a day (TID) | INTRAMUSCULAR | Status: DC
  Administered 2023-09-29: 2 [IU] via SUBCUTANEOUS
  Filled 2023-09-28: qty 1

## 2023-09-28 NOTE — Evaluation (Signed)
 Physical Therapy Evaluation Patient Details Name: Jill Hayes MRN: [id number] DOB: 04/04/1950 Today's Date: 09/28/2023  History of Present Illness  Pt is an 73 y/o F admitted on 09/26/23 after presenting with c/o dizziness, exhaustion, & nausea. Pt found to have severe alcoholic ketoacidosis with severe lactic acidosis & acute UTI. PMH alcohol  use disorder, tobacco use disorder, stroke, depression, HTN, cataract, R hip fx s/p ORIF 11/2022  Clinical Impression  Pt seen for PT evaluation with pt agreeable to tx. Pt reports prior to admission she was independent, living alone in a mobile home with ramped entry, driving. On this date, pt requires min assist for ambulation without AD, CGA<>Min assist to ambulate longer distances in hallway with RW. Pt presents with generalized deconditioning & decreased balance. Recommend ongoing PT services to progress mobility as able & reduce fall risk. At this time, pt is at high risk for falls, do not recommend she d/c home alone & reports she doesn't have anyone that can provide significant level of assist/supervision.        If plan is discharge home, recommend the following: A little help with walking and/or transfers;A little help with bathing/dressing/bathroom;Assistance with cooking/housework;Assist for transportation;Help with stairs or ramp for entrance   Can travel by private vehicle   Yes    Equipment Recommendations None recommended by PT  Recommendations for Other Services  Rehab consult    Functional Status Assessment Patient has had a recent decline in their functional status and demonstrates the ability to make significant improvements in function in a reasonable and predictable amount of time.     Precautions / Restrictions Precautions Precautions: Fall Restrictions Weight Bearing Restrictions Per Provider Order: No      Mobility  Bed Mobility Overal bed mobility: Modified Independent, Needs Assistance Bed Mobility: Supine to Sit      Supine to sit: Modified independent (Device/Increase time), HOB elevated, Used rails (exit L side of bed, bed rails, HOB slightly elevated)          Transfers Overall transfer level: Needs assistance Equipment used: None, Rolling walker (2 wheels) Transfers: Sit to/from Stand Sit to Stand: Contact guard assist           General transfer comment: sit>stand from EOB without AD with CGA, sit>stand from recliner with cuing re: hand placement to push to standing vs pulling on RW    Ambulation/Gait Ambulation/Gait assistance: Min assist, Contact guard assist Gait Distance (Feet): 7 Feet (+ 150 ft) Assistive device: None, Rollator (4 wheels) Gait Pattern/deviations: Decreased step length - left, Decreased stride length, Decreased step length - right Gait velocity: decreased     General Gait Details: Pt ambulates around bed without AD with min assist, cuing to not reach for furniture for support. Pt ambulates in hallway with RW & CGA<>min assist, pt reports she feels like she's falling to the side at times.  Stairs            Wheelchair Mobility     Tilt Bed    Modified Rankin (Stroke Patients Only)       Balance Overall balance assessment: Needs assistance Sitting-balance support: Feet supported Sitting balance-Leahy Scale: Good     Standing balance support: During functional activity, No upper extremity supported Standing balance-Leahy Scale: Poor                               Pertinent Vitals/Pain Pain Assessment Pain Assessment: No/denies pain  Home Living Family/patient expects to be discharged to:: Private residence Living Arrangements: Alone Available Help at Discharge: Family;Available PRN/intermittently Type of Home: Mobile home Home Access: Ramped entrance       Home Layout: One level Home Equipment: Agricultural consultant (2 wheels);Patent examiner (4 wheels)      Prior Function Prior Level of Function :  Independent/Modified Independent;Driving             Mobility Comments: Ambulatory without AD, denies falls ADLs Comments: reports independence     Extremity/Trunk Assessment   Upper Extremity Assessment Upper Extremity Assessment: Overall WFL for tasks assessed    Lower Extremity Assessment Lower Extremity Assessment: Generalized weakness       Communication   Communication Communication: No apparent difficulties    Cognition Arousal: Alert Behavior During Therapy: Impulsive   PT - Cognitive impairments: No family/caregiver present to determine baseline, Safety/Judgement, Awareness, Problem solving                                 Cueing Cueing Techniques: Verbal cues     General Comments      Exercises     Assessment/Plan    PT Assessment Patient needs continued PT services  PT Problem List Decreased strength;Decreased coordination;Decreased cognition;Decreased activity tolerance;Decreased safety awareness;Decreased balance;Decreased mobility;Decreased knowledge of use of DME       PT Treatment Interventions DME instruction;Balance training;Gait training;Neuromuscular re-education;Stair training;Functional mobility training;Therapeutic exercise;Therapeutic activities;Patient/family education    PT Goals (Current goals can be found in the Care Plan section)  Acute Rehab PT Goals Patient Stated Goal: get better PT Goal Formulation: With patient Time For Goal Achievement: 10/12/23 Potential to Achieve Goals: Good    Frequency Min 2X/week     Co-evaluation               AM-PAC PT 6 Clicks Mobility  Outcome Measure Help needed turning from your back to your side while in a flat bed without using bedrails?: None Help needed moving from lying on your back to sitting on the side of a flat bed without using bedrails?: A Little Help needed moving to and from a bed to a chair (including a wheelchair)?: A Little Help needed standing up  from a chair using your arms (e.g., wheelchair or bedside chair)?: A Little Help needed to walk in hospital room?: A Little Help needed climbing 3-5 steps with a railing? : A Little 6 Click Score: 19    End of Session   Activity Tolerance: Patient limited by fatigue;Patient tolerated treatment well Patient left: in chair;with chair alarm set;with call bell/phone within reach Nurse Communication: Mobility status PT Visit Diagnosis: Unsteadiness on feet (R26.81);Muscle weakness (generalized) (M62.81);Other abnormalities of gait and mobility (R26.89)    Time: 8843-8792 PT Time Calculation (min) (ACUTE ONLY): 11 min   Charges:   PT Evaluation $PT Eval Low Complexity: 1 Low   PT General Charges $$ ACUTE PT VISIT: 1 Visit         Richerd Pinal, PT, DPT 09/28/23, 12:14 PM   Richerd CHRISTELLA Pinal 09/28/2023, 12:12 PM

## 2023-09-28 NOTE — Evaluation (Signed)
 Occupational Therapy Evaluation Patient Details Name: Jill Hayes MRN: [id number] DOB: 18-Apr-1950 Today's Date: 09/28/2023   History of Present Illness   Pt is an 73 y/o F admitted on 09/26/23 after presenting with c/o dizziness, exhaustion, & nausea. Pt found to have severe alcoholic ketoacidosis with severe lactic acidosis & acute UTI. PMH alcohol  use disorder, tobacco use disorder, stroke, depression, HTN, cataract, R hip fx s/p ORIF 11/2022     Clinical Impressions Patient presenting with decreased Ind in self care,balance, functional mobility/transfers, endurance, and safety awareness. Patient reports living at home alone and independently without use of AD. Pt is impulsive with mobility during evaluation and needing min cues for safety awareness. Patient currently functioning at supervision - min guard with use of RW for toileting and functional mobility within the room. Pt returning to hospital bed at end of session with RN present to give medications.  Patient will benefit from acute OT to increase overall independence in the areas of ADLs, functional mobility, and safety awareness in order to safely discharge.     If plan is discharge home, recommend the following:   A little help with walking and/or transfers;A little help with bathing/dressing/bathroom;Assistance with cooking/housework;Assist for transportation;Help with stairs or ramp for entrance     Functional Status Assessment   Patient has had a recent decline in their functional status and demonstrates the ability to make significant improvements in function in a reasonable and predictable amount of time.     Equipment Recommendations   Other (comment) (defer to next venue of care)      Precautions/Restrictions   Precautions Precautions: Fall     Mobility Bed Mobility Overal bed mobility: Modified Independent, Needs Assistance Bed Mobility: Supine to Sit     Supine to sit: Modified independent  (Device/Increase time), HOB elevated, Used rails     General bed mobility comments: increased time and effort    Transfers Overall transfer level: Needs assistance Equipment used: Rolling walker (2 wheels) Transfers: Sit to/from Stand Sit to Stand: Contact guard assist                  Balance Overall balance assessment: Needs assistance Sitting-balance support: Feet supported Sitting balance-Leahy Scale: Good     Standing balance support: During functional activity, No upper extremity supported Standing balance-Leahy Scale: Poor                             ADL either performed or assessed with clinical judgement   ADL Overall ADL's : Needs assistance/impaired                         Toilet Transfer: Contact guard assist;Rolling walker (2 wheels);Regular Toilet;Grab bars   Toileting- Clothing Manipulation and Hygiene: Contact guard assist;Sit to/from stand               Vision Patient Visual Report: No change from baseline       Perception         Praxis         Pertinent Vitals/Pain Pain Assessment Pain Assessment: No/denies pain     Extremity/Trunk Assessment Upper Extremity Assessment Upper Extremity Assessment: Overall WFL for tasks assessed   Lower Extremity Assessment Lower Extremity Assessment: Generalized weakness       Communication Communication Communication: No apparent difficulties   Cognition Arousal: Alert Behavior During Therapy: Impulsive Cognition: No apparent impairments  Following commands: Intact       Cueing  General Comments   Cueing Techniques: Verbal cues              Home Living Family/patient expects to be discharged to:: Private residence Living Arrangements: Alone Available Help at Discharge: Family;Available PRN/intermittently Type of Home: Mobile home Home Access: Ramped entrance     Home Layout: One level     Bathroom  Shower/Tub: Producer, television/film/video: Standard     Home Equipment: Agricultural consultant (2 wheels);Toilet riser;Rollator (4 wheels)          Prior Functioning/Environment Prior Level of Function : Independent/Modified Independent;Driving             Mobility Comments: Ambulatory without AD, denies falls ADLs Comments: Endorses being Ind in all aspects of ADLs and IADLs    OT Problem List: Decreased strength;Decreased safety awareness;Decreased activity tolerance;Decreased knowledge of use of DME or AE;Impaired balance (sitting and/or standing);Cardiopulmonary status limiting activity   OT Treatment/Interventions: Self-care/ADL training;Therapeutic activities;Patient/family education;DME and/or AE instruction;Balance training      OT Goals(Current goals can be found in the care plan section)   Acute Rehab OT Goals Patient Stated Goal: to go home OT Goal Formulation: With patient Time For Goal Achievement: 10/12/23 Potential to Achieve Goals: Fair ADL Goals Pt Will Perform Grooming: with modified independence;standing Pt Will Perform Lower Body Dressing: with modified independence;sit to/from stand Pt Will Transfer to Toilet: with modified independence;ambulating Pt Will Perform Toileting - Clothing Manipulation and hygiene: with modified independence;sit to/from stand   OT Frequency:  Min 2X/week       AM-PAC OT 6 Clicks Daily Activity     Outcome Measure Help from another person eating meals?: None Help from another person taking care of personal grooming?: None Help from another person toileting, which includes using toliet, bedpan, or urinal?: A Little Help from another person bathing (including washing, rinsing, drying)?: A Little Help from another person to put on and taking off regular upper body clothing?: None Help from another person to put on and taking off regular lower body clothing?: A Little 6 Click Score: 21   End of Session Equipment Utilized  During Treatment: Rolling walker (2 wheels) Nurse Communication: Mobility status  Activity Tolerance: Patient tolerated treatment well Patient left: in bed;with call bell/phone within reach;with bed alarm set  OT Visit Diagnosis: Unsteadiness on feet (R26.81);Repeated falls (R29.6);Muscle weakness (generalized) (M62.81)                Time: 1358-1410 OT Time Calculation (min): 12 min Charges:  OT General Charges $OT Visit: 1 Visit OT Evaluation $OT Eval Low Complexity: 1 Low  Izetta Claude, MS, OTR/L , CBIS ascom [phone]  09/28/23, 3:07 PM

## 2023-09-28 NOTE — Plan of Care (Signed)
  Problem: Clinical Measurements: Goal: Ability to maintain clinical measurements within normal limits will improve Outcome: Progressing   Problem: Activity: Goal: Risk for activity intolerance will decrease Outcome: Progressing   Problem: Pain Managment: Goal: General experience of comfort will improve and/or be controlled Outcome: Progressing   Problem: Safety: Goal: Ability to remain free from injury will improve Outcome: Progressing

## 2023-09-28 NOTE — Progress Notes (Signed)
 Progress Note    Jill Hayes  FMW:[id number] DOB: [birth year]  DOA: 09/26/2023 PCP: Sharma Coyer, MD      Brief Narrative:    Medical records reviewed and are as summarized below:  Jill Hayes is a 73 y.o. female with medical history significant for alcohol  use disorder, tobacco use disorder, stroke, depression, hypertension, cataract, right hip fracture s/p ORIF in November 2024, who presented to the hospital because of dizziness, exhaustion and feeling sick to her stomach.  Reportedly, she had initially stated that she was recovering from alcohol  use disorder when her daughter was at the bedside, but she later endorsed that she had resumed drinking alcohol  on a regular basis in the preceding 6 months or so.  She drinks about 16 ounces of alcohol  daily.  She also smokes about a pack of cigarettes a day.   She was found to have severe alcoholic ketoacidosis with severe lactic acidosis and acute UTI.  He was admitted to the ICU for further management. She was treated with IV bicarbonate infusion.   Significant Hospital Events: Including procedures, antibiotic start and stop dates in addition to other pertinent events   09/26/23: Admit to ICU due to high anion gap metabolic acidosis s/t alcoholic ketoacidosis and suspected sepsis s/t UTI requiring sodium bicarbonate  drip. 09/27/23: Alcoholic ketoacidosis improving, bicarb drip transitioned to maintenance IV fluids.  Starting to show signs of alcohol  withdraw as is becoming tremulous, Librium  taper initiated.   Assessment/Plan:   Principal Problem:   High anion gap metabolic acidosis   Nutrition Problem: Severe Malnutrition Etiology: social / environmental circumstances  Signs/Symptoms: severe fat depletion, severe muscle depletion   Body mass index is 17.94 kg/m.     Alcoholic ketoacidosis, lactic acidosis: Resolved.  S/p treatment with IV sodium bicarbonate  infusion.   Acute UTI, probable severe  sepsis: Continue IV ceftriaxone .  Follow-up urine culture.   Acute alcohol  intoxication, alcohol  use disorder: Continue Librium  taper.  IV Ativan  as needed per CIWA protocol.  Counseled to quit drinking alcohol .   Hypophosphatemia: Treat with IV potassium phosphate  infusion. Hypokalemia: Treat with IV potassium phosphate  and oral potassium chloride    Elevated liver enzymes: Likely due to combination of fatty liver and alcohol  use disorder   Generalized weakness: PT evaluation  Diet Order             Diet regular Room service appropriate? Yes; Fluid consistency: Thin  Diet effective now                                  Consultants: Intensivist  Procedures: None    Medications:    chlordiazePOXIDE   25 mg Oral QID   Followed by   chlordiazePOXIDE   25 mg Oral TID   Followed by   NOREEN ON 09/29/2023] chlordiazePOXIDE   25 mg Oral BH-qamhs   Followed by   NOREEN ON 09/30/2023] chlordiazePOXIDE   25 mg Oral Daily   Chlorhexidine  Gluconate Cloth  6 each Topical Daily   enoxaparin  (LOVENOX ) injection  40 mg Subcutaneous QHS   feeding supplement  237 mL Oral BID BM   folic acid   1 mg Oral Daily   insulin  aspart  0-9 Units Subcutaneous Q4H   multivitamin with minerals  1 tablet Oral Daily   nicotine   21 mg Transdermal Daily   potassium chloride   20 mEq Oral Once   [START ON 09/30/2023] thiamine  (VITAMIN B1) injection  100 mg Intravenous  Q24H   Continuous Infusions:  cefTRIAXone  (ROCEPHIN )  IV Stopped (09/27/23 2331)   potassium PHOSPHATE  IVPB (in mmol)     thiamine  (VITAMIN B1) injection 500 mg (09/28/23 0941)     Anti-infectives (From admission, onward)    Start     Dose/Rate Route Frequency Ordered Stop   09/27/23 2100  cefTRIAXone  (ROCEPHIN ) 2 g in sodium chloride  0.9 % 100 mL IVPB        2 g 200 mL/hr over 30 Minutes Intravenous Every 24 hours 09/27/23 0059 10/01/23 2059   09/26/23 2130  cefTRIAXone  (ROCEPHIN ) 2 g in sodium chloride  0.9 % 100 mL  IVPB        2 g 200 mL/hr over 30 Minutes Intravenous  Once 09/26/23 2118 09/26/23 2233              Family Communication/Anticipated D/C date and plan/Code Status   DVT prophylaxis: enoxaparin  (LOVENOX ) injection 40 mg Start: 09/27/23 2200 SCDs Start: 09/26/23 2240     Code Status: Full Code  Family Communication: None Disposition Plan: Plan to discharge home   Status is: Inpatient Remains inpatient appropriate because: Alcoholic ketoacidosis, hypophosphatemia, UTI       Subjective:   Interval events noted.  She complains of general weakness.  No other complaints.  Objective:    Vitals:   09/28/23 0118 09/28/23 0450 09/28/23 0500 09/28/23 0744  BP: (!) 107/93 (!) 146/91  (!) 131/98  Pulse: 78 81  69  Resp: 18 18  18   Temp: (!) 97.5 F (36.4 C) 97.9 F (36.6 C)  97.7 F (36.5 C)  TempSrc: Oral Oral  Oral  SpO2: 100% 100%  100%  Weight:   47.4 kg   Height:       No data found.   Intake/Output Summary (Last 24 hours) at 09/28/2023 1101 Last data filed at 09/28/2023 0900 Gross per 24 hour  Intake 395 ml  Output 2350 ml  Net -1955 ml   Filed Weights   09/26/23 2018 09/26/23 2340 09/28/23 0500  Weight: 45 kg 49.4 kg 47.4 kg    Exam:  GEN: NAD SKIN: Warm and dry EYES: No pallor or icterus ENT: MMM CV: RRR PULM: CTA B ABD: soft, ND, NT, +BS CNS: AAO x 3, non focal EXT: No edema or tenderness        Data Reviewed:   I have personally reviewed following labs and imaging studies:  Labs: Labs show the following:   Basic Metabolic Panel: Recent Labs  Lab 09/26/23 2023 09/26/23 2348 09/27/23 0341 09/27/23 1620 09/28/23 0502 09/28/23 0503  NA 138  --  136 135  --  136  K 3.6  --  4.0 3.9  --  3.3*  CL 95*  --  91* 94*  --  95*  CO2 14*  --  24 28  --  29  GLUCOSE 83  --  161* 130*  --  105*  BUN 9  --  7* 6*  --  5*  CREATININE 0.91  --  0.78 0.52  --  0.66  CALCIUM  8.7*  --  7.8* 7.9*  --  8.3*  MG  --  1.2* 3.0*  --  1.9   --   PHOS  --  4.7* 2.7  --   --  1.6*   GFR Estimated Creatinine Clearance: 46.2 mL/min (by C-G formula based on SCr of 0.66 mg/dL). Liver Function Tests: Recent Labs  Lab 09/26/23 2023 09/27/23 0341 09/28/23 0502 09/28/23 0503  AST  207* 199* 101*  --   ALT 68* 66* 48*  --   ALKPHOS 137* 126 112  --   BILITOT 1.3* 1.3* 1.0  --   PROT 7.3 6.7 6.9  --   ALBUMIN 4.0 3.6 3.6 3.4*   No results for input(s): LIPASE, AMYLASE in the last 168 hours. Recent Labs  Lab 09/27/23 0341  AMMONIA <13   Coagulation profile Recent Labs  Lab 09/27/23 0341  INR 1.1    CBC: Recent Labs  Lab 09/26/23 2023 09/27/23 0341 09/28/23 0502  WBC 11.4* 7.1 6.0  HGB 14.9 14.0 13.9  HCT 45.9 41.7 40.5  MCV 98.5 94.8 93.3  PLT 173 142* 125*   Cardiac Enzymes: No results for input(s): CKTOTAL, CKMB, CKMBINDEX, TROPONINI in the last 168 hours. BNP (last 3 results) No results for input(s): PROBNP in the last 8760 hours. CBG: Recent Labs  Lab 09/27/23 1559 09/27/23 1923 09/28/23 0116 09/28/23 0432 09/28/23 0745  GLUCAP 130* 222* 105* 102* 87   D-Dimer: No results for input(s): DDIMER in the last 72 hours. Hgb A1c: No results for input(s): HGBA1C in the last 72 hours. Lipid Profile: No results for input(s): CHOL, HDL, LDLCALC, TRIG, CHOLHDL, LDLDIRECT in the last 72 hours. Thyroid function studies: No results for input(s): TSH, T4TOTAL, T3FREE, THYROIDAB in the last 72 hours.  Invalid input(s): FREET3 Anemia work up: No results for input(s): VITAMINB12, FOLATE, FERRITIN, TIBC, IRON, RETICCTPCT in the last 72 hours. Sepsis Labs: Recent Labs  Lab 09/26/23 2023 09/26/23 2223 09/27/23 0204 09/27/23 0341 09/27/23 0835 09/27/23 1052 09/28/23 0502  PROCALCITON  --   --   --  0.29  --   --  0.31  WBC 11.4*  --   --  7.1  --   --  6.0  LATICACIDVEN  --    < > 4.7* 2.3* 1.8 1.2  --    < > = values in this interval not displayed.     Microbiology Recent Results (from the past 240 hours)  Blood culture (routine x 2)     Status: None (Preliminary result)   Collection Time: 09/26/23 10:23 PM   Specimen: BLOOD  Result Value Ref Range Status   Specimen Description BLOOD BLOOD RIGHT ARM  Final   Special Requests   Final    BOTTLES DRAWN AEROBIC AND ANAEROBIC Blood Culture results may not be optimal due to an inadequate volume of blood received in culture bottles   Culture   Final    NO GROWTH 2 DAYS Performed at [facility], [address] [place], [place] [address]    Report Status PENDING  Incomplete  Blood culture (routine x 2)     Status: None (Preliminary result)   Collection Time: 09/26/23 10:23 PM   Specimen: BLOOD  Result Value Ref Range Status   Specimen Description BLOOD BLOOD RIGHT ARM  Final   Special Requests   Final    BOTTLES DRAWN AEROBIC AND ANAEROBIC Blood Culture results may not be optimal due to an inadequate volume of blood received in culture bottles   Culture   Final    NO GROWTH 2 DAYS Performed at [facility], [address] [place], [place] [address]    Report Status PENDING  Incomplete  MRSA Next Gen by PCR, Nasal     Status: None   Collection Time: 09/26/23 10:39 PM   Specimen: Nasal Mucosa; Nasal Swab  Result Value Ref Range Status   MRSA by PCR Next Gen  NOT DETECTED NOT DETECTED Final    Comment: (NOTE) The GeneXpert MRSA Assay (FDA approved for NASAL specimens only), is one component of a comprehensive MRSA colonization surveillance program. It is not intended to diagnose MRSA infection nor to guide or monitor treatment for MRSA infections. Test performance is not FDA approved in patients less than 40 years old. Performed at [facility], [address] [place], [place] [address]     Procedures and diagnostic studies:  US  Abdomen Limited RUQ (LIVER/GB) Result Date: 09/27/2023 CLINICAL DATA:  History of cholecystectomy presenting with  abdominal pain and elevated liver function test. EXAM: ULTRASOUND ABDOMEN LIMITED RIGHT UPPER QUADRANT COMPARISON:  None Available. FINDINGS: Gallbladder: The gallbladder is surgically absent. Common bile duct: Diameter: 6.4 mm Liver: No focal lesion identified. Diffusely increased echogenicity of the liver parenchyma is noted. Portal vein is patent on color Doppler imaging with normal direction of blood flow towards the liver. Other: None. IMPRESSION: 1. Findings consistent with history of prior cholecystectomy. 2. Hepatic steatosis. Electronically Signed   By: Suzen Dials M.D.   On: 09/27/2023 01:39   CT ABDOMEN PELVIS W CONTRAST Result Date: 09/26/2023 CLINICAL DATA:  Dizziness and low blood sugar, concern for cholangitis EXAM: CT ABDOMEN AND PELVIS WITH CONTRAST TECHNIQUE: Multidetector CT imaging of the abdomen and pelvis was performed using the standard protocol following bolus administration of intravenous contrast. RADIATION DOSE REDUCTION: This exam was performed according to the departmental dose-optimization program which includes automated exposure control, adjustment of the mA and/or kV according to patient size and/or use of iterative reconstruction technique. CONTRAST:  80mL OMNIPAQUE  IOHEXOL  300 MG/ML  SOLN COMPARISON:  None Available. FINDINGS: Lower chest: No acute abnormality. Hepatobiliary: Fatty infiltration of the liver is noted. The gallbladder has been surgically removed. No biliary ductal dilatation is noted. No choledocholithiasis is seen. Pancreas: Unremarkable. No pancreatic ductal dilatation or surrounding inflammatory changes. Spleen: Normal in size without focal abnormality. Adrenals/Urinary Tract: Adrenal glands are within normal limits. Kidneys demonstrate a normal enhancement pattern bilaterally. Normal excretion is seen bilaterally. No obstructive changes are noted. No calculi are identified. The bladder is well distended. Stomach/Bowel: No obstructive or inflammatory  changes of the colon are noted. The appendix is within normal limits. Small bowel and stomach are unremarkable. Vascular/Lymphatic: Aortic atherosclerosis. No enlarged abdominal or pelvic lymph nodes. Reproductive: Status post hysterectomy. No adnexal masses. Dystrophic calcifications are noted in the region of the previous uterus unchanged from prior CT in 2024. Other: No abdominal wall hernia or abnormality. No abdominopelvic ascites. Musculoskeletal: Postsurgical changes in the proximal right hip are noted. Degenerative changes of lumbar spine are seen. No acute compression deformity is noted. Chronic L1 compression fracture is seen. IMPRESSION: Fatty liver. No acute abnormality to correspond with the given clinical history. Electronically Signed   By: Oneil Devonshire M.D.   On: 09/26/2023 22:27   DG Chest 2 View Result Date: 09/26/2023 CLINICAL DATA:  Dizziness. EXAM: CHEST - 2 VIEW COMPARISON:  December 20, 2022 FINDINGS: The heart size and mediastinal contours are within normal limits. Moderate severity calcification of the aortic arch is noted. Both lungs are clear. The visualized skeletal structures are unremarkable. IMPRESSION: No active cardiopulmonary disease. Electronically Signed   By: Suzen Dials M.D.   On: 09/26/2023 20:58   CT Head Wo Contrast Result Date: 09/26/2023 CLINICAL DATA:  Dizziness. EXAM: CT HEAD WITHOUT CONTRAST TECHNIQUE: Contiguous axial images were obtained from the base of the skull through the vertex without intravenous contrast. RADIATION DOSE REDUCTION:  This exam was performed according to the departmental dose-optimization program which includes automated exposure control, adjustment of the mA and/or kV according to patient size and/or use of iterative reconstruction technique. COMPARISON:  December 20, 2022 FINDINGS: Brain: There is generalized cerebral atrophy with widening of the extra-axial spaces and ventricular dilatation. There are areas of decreased attenuation  within the white matter tracts of the supratentorial brain, consistent with microvascular disease changes. Bilateral chronic basal ganglia lacunar infarcts are noted. Vascular: No hyperdense vessel or unexpected calcification. Skull: Normal. Negative for fracture or focal lesion. Sinuses/Orbits: There is moderate to marked severity right maxillary sinus mucosal thickening. Other: None. IMPRESSION: 1. Generalized cerebral atrophy with chronic white matter small vessel ischemic changes. 2. Bilateral chronic basal ganglia lacunar infarcts. 3. Moderate to marked severity right maxillary sinus disease. Electronically Signed   By: Suzen Dials M.D.   On: 09/26/2023 20:47               LOS: 2 days   Labrittany Wechter  [facility]   Pager on [email or web address]. If 7PM-7AM, please contact night-coverage at [email or web address]     09/28/2023, 11:01 AM

## 2023-09-28 NOTE — Consult Note (Signed)
 PHARMACY CONSULT NOTE - ELECTROLYTES  Pharmacy Consult for Electrolyte Monitoring and Replacement   Recent Labs: Potassium (mmol/L)  Date Value  09/28/2023 3.3 (L)   Magnesium  (mg/dL)  Date Value  91/69/7974 1.9   Calcium  (mg/dL)  Date Value  91/69/7974 8.3 (L)   Albumin (g/dL)  Date Value  91/69/7974 3.4 (L)  05/02/2023 4.0   Phosphorus (mg/dL)  Date Value  91/69/7974 1.6 (L)   Sodium (mmol/L)  Date Value  09/28/2023 136  05/02/2023 137   Corrected Ca: 8.1 mg/dL  Height: 5' 4 (837.3 cm) Weight: 47.4 kg (104 lb 8 oz) IBW/kg (Calculated) : 54.7 Estimated Creatinine Clearance: 46.2 mL/min (by C-G formula based on SCr of 0.66 mg/dL).  Assessment  Jill Hayes is a 73 y.o. female presenting with alcoholic ketoacidosis. PMH significant for alcohol  use disorder (last drink 8/27 PM), CVA, tobacco use disorder (> 50 pack years), HTN. Pharmacy has been consulted to monitor and replace electrolytes.  Diet: NPO MIVF: N/A Pertinent medications: N/A  Goal of Therapy: Electrolytes within normal limits  Plan:  Kphos tabs 2 tabs x 4 Kcl 20 mEq x 1  Pt transferred out of the ICU. Pharmacy will sign off. Please re-consult if needed.   Thank you for allowing pharmacy to be a part of this patient's care.  Cathaleen GORMAN Blanch, PharmD  09/28/2023 7:19 AM

## 2023-09-29 DIAGNOSIS — E8729 Other acidosis: Secondary | ICD-10-CM | POA: Diagnosis not present

## 2023-09-29 LAB — COMPREHENSIVE METABOLIC PANEL WITH GFR
ALT: 36 U/L (ref 0–44)
AST: 61 U/L — ABNORMAL HIGH (ref 15–41)
Albumin: 3.2 g/dL — ABNORMAL LOW (ref 3.5–5.0)
Alkaline Phosphatase: 91 U/L (ref 38–126)
Anion gap: 12 (ref 5–15)
BUN: 13 mg/dL (ref 8–23)
CO2: 26 mmol/L (ref 22–32)
Calcium: 8.9 mg/dL (ref 8.9–10.3)
Chloride: 98 mmol/L (ref 98–111)
Creatinine, Ser: 0.51 mg/dL (ref 0.44–1.00)
GFR, Estimated: >60 mL/min (ref >60)
Glucose, Bld: 125 mg/dL — ABNORMAL HIGH (ref 70–99)
Potassium: 3.4 mmol/L — ABNORMAL LOW (ref 3.5–5.1)
Sodium: 136 mmol/L (ref 135–145)
Total Bilirubin: 0.9 mg/dL (ref 0.0–1.2)
Total Protein: 6.6 g/dL (ref 6.5–8.1)

## 2023-09-29 LAB — CBC
HCT: 38.1 % (ref 36.0–46.0)
Hemoglobin: 12.7 g/dL (ref 12.0–15.0)
MCH: 31.6 pg (ref 26.0–34.0)
MCHC: 33.3 g/dL (ref 30.0–36.0)
MCV: 94.8 fL (ref 80.0–100.0)
Platelets: 106 K/uL — ABNORMAL LOW (ref 150–400)
RBC: 4.02 MIL/uL (ref 3.87–5.11)
RDW: 14.6 % (ref 11.5–15.5)
WBC: 4.9 K/uL (ref 4.0–10.5)
nRBC: 0 % (ref 0.0–0.2)

## 2023-09-29 LAB — MAGNESIUM: Magnesium: 1.7 mg/dL (ref 1.7–2.4)

## 2023-09-29 LAB — GLUCOSE, CAPILLARY: Glucose-Capillary: 156 mg/dL — ABNORMAL HIGH (ref 70–99)

## 2023-09-29 LAB — HEMOGLOBIN A1C
Hgb A1c MFr Bld: 5.4 % (ref 4.8–5.6)
Mean Plasma Glucose: 108 mg/dL

## 2023-09-29 LAB — PHOSPHORUS: Phosphorus: 3.2 mg/dL (ref 2.5–4.6)

## 2023-09-29 MED ORDER — POTASSIUM CHLORIDE CRYS ER 20 MEQ PO TBCR
40.0000 meq | EXTENDED_RELEASE_TABLET | Freq: Once | ORAL | Status: AC
  Administered 2023-09-29: 40 meq via ORAL
  Filled 2023-09-29: qty 2

## 2023-09-29 MED ORDER — CEPHALEXIN 500 MG PO CAPS: 500.0000 mg | ORAL_CAPSULE | Freq: Two times a day (BID) | ORAL | Status: DC

## 2023-09-29 MED ORDER — CEPHALEXIN 500 MG PO CAPS: 500.0000 mg | ORAL_CAPSULE | Freq: Two times a day (BID) | ORAL | 0 refills | Status: DC

## 2023-09-29 MED ORDER — AMLODIPINE BESYLATE 5 MG PO TABS
2.5000 mg | ORAL_TABLET | Freq: Every day | ORAL | Status: DC
  Administered 2023-09-29: 2.5 mg via ORAL
  Filled 2023-09-29: qty 1

## 2023-09-29 MED ORDER — THIAMINE MONONITRATE 100 MG PO TABS
100.0000 mg | ORAL_TABLET | Freq: Every day | ORAL | Status: DC
  Administered 2023-09-29: 100 mg via ORAL
  Filled 2023-09-29: qty 1

## 2023-09-29 MED ORDER — AMLODIPINE BESYLATE 2.5 MG PO TABS: 2.5000 mg | ORAL_TABLET | Freq: Every day | ORAL | 0 refills | Status: AC

## 2023-09-29 MED ORDER — METOPROLOL TARTRATE 25 MG PO TABS: 12.5000 mg | ORAL_TABLET | Freq: Two times a day (BID) | ORAL | 0 refills | Status: AC

## 2023-09-29 NOTE — Progress Notes (Signed)
 MD order received in [facility] to discharge pt home today; verbally reviewed AVS with pt including Rxs escribed to [facility] in [place], [place]; no questions voiced at this time; TOC notified of OT and PT follow up services recommended in the notes; advised pt a TOC representative will be notifying her of recommended services

## 2023-09-29 NOTE — Discharge Summary (Addendum)
 Physician Discharge Summary   Patient: Jill Hayes MRN: [id number] DOB: [birth year]  Admit date:     09/26/2023  Discharge date: 09/29/23  Discharge Physician: AIDA CHO   PCP: Sharma Coyer, MD   Recommendations at discharge:   Follow-up with PCP in 1 to 2 weeks  Discharge Diagnoses: Principal Problem:   Alcoholic ketoacidosis  Resolved Problems:   * No resolved hospital problems. *  Hospital Course:  Jill Hayes is a 73 y.o. female with medical history significant for alcohol  use disorder, tobacco use disorder, stroke, depression, hypertension, cataract, right hip fracture s/p ORIF in November 2024, who presented to the hospital because of dizziness, exhaustion and feeling sick to her stomach.  Reportedly, she had initially stated that she was recovering from alcohol  use disorder when her daughter was at the bedside, but she later endorsed that she had resumed drinking alcohol  on a regular basis in the preceding 6 months or so.  She drinks about 16 ounces of alcohol  daily.  She also smokes about a pack of cigarettes a day.     She was found to have severe alcoholic ketoacidosis with severe lactic acidosis and acute UTI.  He was admitted to the ICU for further management. She was treated with IV bicarbonate infusion.     Significant Hospital Events: Including procedures, antibiotic start and stop dates in addition to other pertinent events   09/26/23: Admit to ICU due to high anion gap metabolic acidosis s/t alcoholic ketoacidosis and suspected sepsis s/t UTI requiring sodium bicarbonate  drip. 09/27/23: Alcoholic ketoacidosis improving, bicarb drip transitioned to maintenance IV fluids.  Starting to show signs of alcohol  withdraw as is becoming tremulous, Librium  taper initiated.    Assessment and Plan:  Alcoholic ketoacidosis, lactic acidosis: Resolved.  S/p treatment with IV sodium bicarbonate  infusion.     Acute UTI, probable severe sepsis: No growth on  urine culture.  Start Keflex  and continue for 2 more days to complete 5 days of treatment.  S/p treatment with IV ceftriaxone .     Acute alcohol  intoxication, alcohol  use disorder: Discontinue Librium .   Counseled to quit drinking alcohol . She has also been counseled to stop smoking cigarettes because of tobacco use disorder    Hypophosphatemia: Improved Hypokalemia: Improved     Elevated liver enzymes: Likely due to combination of fatty liver and alcohol  use disorder     Generalized weakness: PT recommended discharge to SNF.  However, patient declined SNF.  She says she has been able to ambulate with a walker and prefers to go home with home health therapy.  She understands that she may be at increased risk for falls.   She is deemed stable for discharge to home today.    ADDENDUM  Urine culture was updated on 09/30/2023 after patient had been discharged from the hospital (on 09/29/2023).  It showed multidrug-resistant E. coli that is sensitive to nitrofurantoin and Bactrim .  It is worth noting that patient had been treated with IV ceftriaxone  and switched to Keflex  which are ineffective against this multidrug-resistant E. coli isolate.  Given that patient's condition had improved significantly without appropriate antibiotics, the diagnosis of UTI and severe sepsis are questionable.  However, to err on the side of caution, patient will be given a prescription for Bactrim .  Bactrim  has been sent electronically to [facility] pharmacy in [place].   I called the patient to inform her about the change in antibiotics.  Unfortunately I was unable to reach her after multiple attempts.  I called  Ms. Jill Hayes, her daughter, and told her about the change in antibiotic and that patient should stop Keflex  and start Bactrim  which has been sent electronically to [facility] pharmacy.       Consultants: Intensivist Procedures performed: None Disposition: Home health Diet recommendation:   Discharge Diet Orders (From admission, onward)     Start     Ordered   09/29/23 0000  Diet - low sodium heart healthy        09/29/23 0951           Cardiac diet DISCHARGE MEDICATION: Allergies as of 09/29/2023       Reactions   Codeine Other (See Comments)   GI Upset        Medication List     STOP taking these medications    ibuprofen 200 MG tablet Commonly known as: ADVIL       TAKE these medications    acetaminophen  500 MG tablet Commonly known as: TYLENOL  Take 500-1,000 mg by mouth every 6 (six) hours as needed for mild pain or moderate pain.   amLODipine  2.5 MG tablet Commonly known as: NORVASC  Take 1 tablet (2.5 mg total) by mouth daily.   Centrum Adult 50+ MultiGummies Chew Chew 1 Dose by mouth 1 day or 1 dose.   cephALEXin  500 MG capsule Commonly known as: KEFLEX  Take 1 capsule (500 mg total) by mouth every 12 (twelve) hours for 2 days.   feeding supplement Liqd Take 237 mLs by mouth 3 (three) times daily between meals.   Magnesium  400 MG Caps Take 1 capsule by mouth in the morning and at bedtime.   metoprolol  tartrate 25 MG tablet Commonly known as: LOPRESSOR  Take 0.5 tablets (12.5 mg total) by mouth 2 (two) times daily.        Discharge Exam: Filed Weights   09/26/23 2018 09/26/23 2340 09/28/23 0500  Weight: 45 kg 49.4 kg 47.4 kg   GEN: NAD SKIN: Warm and dry EYES: No pallor or icterus ENT: MMM CV: RRR PULM: CTA B ABD: soft, ND, NT, +BS CNS: AAO x 3, non focal EXT: No edema or tenderness   Condition at discharge: good  The results of significant diagnostics from this hospitalization (including imaging, microbiology, ancillary and laboratory) are listed below for reference.   Imaging Studies: US  Abdomen Limited RUQ (LIVER/GB) Result Date: 09/27/2023 CLINICAL DATA:  History of cholecystectomy presenting with abdominal pain and elevated liver function test. EXAM: ULTRASOUND ABDOMEN LIMITED RIGHT UPPER QUADRANT COMPARISON:   None Available. FINDINGS: Gallbladder: The gallbladder is surgically absent. Common bile duct: Diameter: 6.4 mm Liver: No focal lesion identified. Diffusely increased echogenicity of the liver parenchyma is noted. Portal vein is patent on color Doppler imaging with normal direction of blood flow towards the liver. Other: None. IMPRESSION: 1. Findings consistent with history of prior cholecystectomy. 2. Hepatic steatosis. Electronically Signed   By: Suzen Dials M.D.   On: 09/27/2023 01:39   CT ABDOMEN PELVIS W CONTRAST Result Date: 09/26/2023 CLINICAL DATA:  Dizziness and low blood sugar, concern for cholangitis EXAM: CT ABDOMEN AND PELVIS WITH CONTRAST TECHNIQUE: Multidetector CT imaging of the abdomen and pelvis was performed using the standard protocol following bolus administration of intravenous contrast. RADIATION DOSE REDUCTION: This exam was performed according to the departmental dose-optimization program which includes automated exposure control, adjustment of the mA and/or kV according to patient size and/or use of iterative reconstruction technique. CONTRAST:  80mL OMNIPAQUE  IOHEXOL  300 MG/ML  SOLN COMPARISON:  None Available. FINDINGS: Lower chest:  No acute abnormality. Hepatobiliary: Fatty infiltration of the liver is noted. The gallbladder has been surgically removed. No biliary ductal dilatation is noted. No choledocholithiasis is seen. Pancreas: Unremarkable. No pancreatic ductal dilatation or surrounding inflammatory changes. Spleen: Normal in size without focal abnormality. Adrenals/Urinary Tract: Adrenal glands are within normal limits. Kidneys demonstrate a normal enhancement pattern bilaterally. Normal excretion is seen bilaterally. No obstructive changes are noted. No calculi are identified. The bladder is well distended. Stomach/Bowel: No obstructive or inflammatory changes of the colon are noted. The appendix is within normal limits. Small bowel and stomach are unremarkable.  Vascular/Lymphatic: Aortic atherosclerosis. No enlarged abdominal or pelvic lymph nodes. Reproductive: Status post hysterectomy. No adnexal masses. Dystrophic calcifications are noted in the region of the previous uterus unchanged from prior CT in 2024. Other: No abdominal wall hernia or abnormality. No abdominopelvic ascites. Musculoskeletal: Postsurgical changes in the proximal right hip are noted. Degenerative changes of lumbar spine are seen. No acute compression deformity is noted. Chronic L1 compression fracture is seen. IMPRESSION: Fatty liver. No acute abnormality to correspond with the given clinical history. Electronically Signed   By: Oneil Devonshire M.D.   On: 09/26/2023 22:27   DG Chest 2 View Result Date: 09/26/2023 CLINICAL DATA:  Dizziness. EXAM: CHEST - 2 VIEW COMPARISON:  December 20, 2022 FINDINGS: The heart size and mediastinal contours are within normal limits. Moderate severity calcification of the aortic arch is noted. Both lungs are clear. The visualized skeletal structures are unremarkable. IMPRESSION: No active cardiopulmonary disease. Electronically Signed   By: Suzen Dials M.D.   On: 09/26/2023 20:58   CT Head Wo Contrast Result Date: 09/26/2023 CLINICAL DATA:  Dizziness. EXAM: CT HEAD WITHOUT CONTRAST TECHNIQUE: Contiguous axial images were obtained from the base of the skull through the vertex without intravenous contrast. RADIATION DOSE REDUCTION: This exam was performed according to the departmental dose-optimization program which includes automated exposure control, adjustment of the mA and/or kV according to patient size and/or use of iterative reconstruction technique. COMPARISON:  December 20, 2022 FINDINGS: Brain: There is generalized cerebral atrophy with widening of the extra-axial spaces and ventricular dilatation. There are areas of decreased attenuation within the white matter tracts of the supratentorial brain, consistent with microvascular disease changes.  Bilateral chronic basal ganglia lacunar infarcts are noted. Vascular: No hyperdense vessel or unexpected calcification. Skull: Normal. Negative for fracture or focal lesion. Sinuses/Orbits: There is moderate to marked severity right maxillary sinus mucosal thickening. Other: None. IMPRESSION: 1. Generalized cerebral atrophy with chronic white matter small vessel ischemic changes. 2. Bilateral chronic basal ganglia lacunar infarcts. 3. Moderate to marked severity right maxillary sinus disease. Electronically Signed   By: Suzen Dials M.D.   On: 09/26/2023 20:47    Microbiology: Results for orders placed or performed during the hospital encounter of 09/26/23  Urine Culture     Status: Abnormal (Preliminary result)   Collection Time: 09/26/23  8:31 PM   Specimen: Urine, Random  Result Value Ref Range Status   Specimen Description   Final    URINE, RANDOM Performed at [facility], [address], [place], [place] [address]    Special Requests   Final    NONE Reflexed from [id number] Performed at [facility], [address] [place], [place] [address]    Culture (A)  Final    >=100,000 COLONIES/mL ESCHERICHIA COLI SUSCEPTIBILITIES TO FOLLOW Performed at [facility], [address] [place], [place] [address]    Report Status PENDING  Incomplete  Blood culture (routine x 2)     Status: None (Preliminary result)   Collection Time: 09/26/23 10:23 PM   Specimen: BLOOD  Result Value Ref Range Status   Specimen Description BLOOD BLOOD RIGHT ARM  Final   Special Requests   Final    BOTTLES DRAWN AEROBIC AND ANAEROBIC Blood Culture results may not be optimal due to an inadequate volume of blood received in culture bottles   Culture   Final    NO GROWTH 3 DAYS Performed at [facility], [address] [place], [place] [address]    Report Status PENDING  Incomplete  Blood culture (routine x 2)     Status: None (Preliminary result)   Collection Time:  09/26/23 10:23 PM   Specimen: BLOOD  Result Value Ref Range Status   Specimen Description BLOOD BLOOD RIGHT ARM  Final   Special Requests   Final    BOTTLES DRAWN AEROBIC AND ANAEROBIC Blood Culture results may not be optimal due to an inadequate volume of blood received in culture bottles   Culture   Final    NO GROWTH 3 DAYS Performed at [facility], [address] [place], [place] [address]    Report Status PENDING  Incomplete  MRSA Next Gen by PCR, Nasal     Status: None   Collection Time: 09/26/23 10:39 PM   Specimen: Nasal Mucosa; Nasal Swab  Result Value Ref Range Status   MRSA by PCR Next Gen NOT DETECTED NOT DETECTED Final    Comment: (NOTE) The GeneXpert MRSA Assay (FDA approved for NASAL specimens only), is one component of a comprehensive MRSA colonization surveillance program. It is not intended to diagnose MRSA infection nor to guide or monitor treatment for MRSA infections. Test performance is not FDA approved in patients less than 30 years old. Performed at [facility], [address] [place], [place] [address]     Labs: CBC: Recent Labs  Lab 09/26/23 2023 09/27/23 0341 09/28/23 0502 09/29/23 0441  WBC 11.4* 7.1 6.0 4.9  HGB 14.9 14.0 13.9 12.7  HCT 45.9 41.7 40.5 38.1  MCV 98.5 94.8 93.3 94.8  PLT 173 142* 125* 106*   Basic Metabolic Panel: Recent Labs  Lab 09/26/23 2023 09/26/23 2348 09/27/23 0341 09/27/23 1620 09/28/23 0502 09/28/23 0503 09/29/23 0441  NA 138  --  136 135  --  136 136  K 3.6  --  4.0 3.9  --  3.3* 3.4*  CL 95*  --  91* 94*  --  95* 98  CO2 14*  --  24 28  --  29 26  GLUCOSE 83  --  161* 130*  --  105* 125*  BUN 9  --  7* 6*  --  5* 13  CREATININE 0.91  --  0.78 0.52  --  0.66 0.51  CALCIUM  8.7*  --  7.8* 7.9*  --  8.3* 8.9  MG  --  1.2* 3.0*  --  1.9  --  1.7  PHOS  --  4.7* 2.7  --   --  1.6* 3.2   Liver Function Tests: Recent Labs  Lab 09/26/23 2023 09/27/23 0341 09/28/23 0502  09/28/23 0503 09/29/23 0441  AST 207* 199* 101*  --  61*  ALT 68* 66* 48*  --  36  ALKPHOS 137* 126 112  --  91  BILITOT 1.3* 1.3* 1.0  --  0.9  PROT 7.3 6.7 6.9  --  6.6  ALBUMIN  4.0 3.6 3.6 3.4* 3.2*   CBG: Recent Labs  Lab 09/28/23 0745 09/28/23 1115 09/28/23 1716 09/28/23 2040 09/29/23 0724  GLUCAP 87 107* 135* 141* 156*    Discharge time spent: greater than 30 minutes.  Signed: AIDA CHO, MD [facility] 09/29/2023

## 2023-09-29 NOTE — Plan of Care (Signed)

## 2023-09-29 NOTE — Discharge Instructions (Addendum)
    Intensive Outpatient Programs   [facility] [address] [place],  [place], [place] [phone]  [facility] (Inpatient and outpatient)[phone] (Suboxone and Methadone) [address] [phone]  [facility]: Alcohol  & Drug [facility] Programs - Intensive Outpatient [address] [place], [place] [address]  [phone]  [facility] (Outpatient, Inpatient, [facility] (Groups and Residental) (insurance only) [phone] [place], [place] [phone]   [facility] (for caregivers and family) [address] [place] [address], [place], [place] [phone]  Residential Treatment Programs  [facility](2 years) Residential: 40 days)[facility] [address] [place], [place], [place] [phone] or [phone]  [facility] [address] [place], [place], [place] [phone]  [facility] ([facility] [address] [place], [place], [place] [phone] Admissions: 8am-3pm M-F  [facility] Program: Residential Program [phone] Days)             [facility]: [facility]  [place], [place], [place]  [phone] or [phone] in Hours over the weekend or by referral)  [facility] [address] [place], [place] [address] [phone] (Do virtual or phone assessment, offer transportation within 25 miles, have in patient and Outpatient options)   Mobil Crisis: [facility]:[phone] (for  crisis response 24 hours a day)      Transition of Care representative will be contacting you regarding follow up with Occupational and Physical Therapy services.

## 2023-09-29 NOTE — Plan of Care (Signed)
  Problem: Education: Goal: Knowledge of General Education information will improve Description: Including pain rating scale, medication(s)/side effects and non-pharmacologic comfort measures Outcome: Adequate for Discharge   Problem: Health Behavior/Discharge Planning: Goal: Ability to manage health-related needs will improve Outcome: Adequate for Discharge   Problem: Clinical Measurements: Goal: Ability to maintain clinical measurements within normal limits will improve Outcome: Adequate for Discharge Goal: Will remain free from infection Outcome: Adequate for Discharge Goal: Diagnostic test results will improve Outcome: Adequate for Discharge Goal: Respiratory complications will improve Outcome: Adequate for Discharge Goal: Cardiovascular complication will be avoided Outcome: Adequate for Discharge   Problem: Activity: Goal: Risk for activity intolerance will decrease Outcome: Adequate for Discharge   Problem: Nutrition: Goal: Adequate nutrition will be maintained Outcome: Adequate for Discharge   Problem: Coping: Goal: Level of anxiety will decrease Outcome: Adequate for Discharge   Problem: Elimination: Goal: Will not experience complications related to bowel motility Outcome: Adequate for Discharge Goal: Will not experience complications related to urinary retention Outcome: Adequate for Discharge   Problem: Pain Managment: Goal: General experience of comfort will improve and/or be controlled Outcome: Adequate for Discharge   Problem: Safety: Goal: Ability to remain free from injury will improve Outcome: Adequate for Discharge   Problem: Skin Integrity: Goal: Risk for impaired skin integrity will decrease Outcome: Adequate for Discharge   Problem: Malnutrition  (NI-5.2) Goal: Food and/or nutrient delivery Description: Individualized approach for food/nutrient provision. Outcome: Adequate for Discharge   Problem: Acute Rehab PT Goals(only PT should  resolve) Goal: Pt Will Transfer Bed To Chair/Chair To Bed Outcome: Adequate for Discharge Goal: Pt Will Ambulate Outcome: Adequate for Discharge Goal: Pt/caregiver will Perform Home Exercise Program Outcome: Adequate for Discharge   Problem: Acute Rehab OT Goals (only OT should resolve) Goal: Pt. Will Perform Grooming Outcome: Adequate for Discharge Goal: Pt. Will Perform Lower Body Dressing Outcome: Adequate for Discharge Goal: Pt. Will Transfer To Toilet Outcome: Adequate for Discharge Goal: Pt. Will Perform Toileting-Clothing Manipulation Outcome: Adequate for Discharge

## 2023-09-30 ENCOUNTER — Telehealth: Payer: Self-pay

## 2023-09-30 ENCOUNTER — Other Ambulatory Visit: Payer: Self-pay | Admitting: Internal Medicine

## 2023-09-30 LAB — BLOOD GAS, VENOUS
Acid-Base Excess: 1.7 mmol/L (ref 0.0–2.0)
Bicarbonate: 27.2 mmol/L (ref 20.0–28.0)
O2 Saturation: 40.9 %
Patient temperature: 37
pCO2, Ven: 45 mmHg (ref 44–60)
pH, Ven: 7.39 (ref 7.25–7.43)

## 2023-09-30 LAB — URINE CULTURE: Culture: >=100000 — AB

## 2023-09-30 MED ORDER — SULFAMETHOXAZOLE-TRIMETHOPRIM 800-160 MG PO TABS: 1.0000 | ORAL_TABLET | Freq: Two times a day (BID) | ORAL | 0 refills | Status: AC

## 2023-09-30 NOTE — Transitions of Care (Post Inpatient/ED Visit) (Unsigned)
   09/30/2023  Name: Jill Hayes MRN: [id number] DOB: 19-Nov-1950  Today's TOC FU Call Status: Today's TOC FU Call Status:: Unsuccessful Call (1st Attempt) Unsuccessful Call (1st Attempt) Date: 09/30/23  Attempted to reach the patient regarding the most recent Inpatient/ED visit.  Follow Up Plan: Additional outreach attempts will be made to reach the patient to complete the Transitions of Care (Post Inpatient/ED visit) call.   Signature Julian Lemmings, LPN [facility] Nurse Health Advisor Direct Dial [phone]

## 2023-10-01 LAB — CULTURE, BLOOD (ROUTINE X 2)
Culture: NO GROWTH
Culture: NO GROWTH

## 2023-10-01 NOTE — Transitions of Care (Post Inpatient/ED Visit) (Unsigned)
   10/01/2023  Name: Jill Hayes MRN: [id number] DOB: 24-Dec-1950  Today's TOC FU Call Status: Today's TOC FU Call Status:: Unsuccessful Call (2nd Attempt) Unsuccessful Call (1st Attempt) Date: 09/30/23 Unsuccessful Call (2nd Attempt) Date: 10/01/23  Attempted to reach the patient regarding the most recent Inpatient/ED visit.  Follow Up Plan: Additional outreach attempts will be made to reach the patient to complete the Transitions of Care (Post Inpatient/ED visit) call.   Signature Julian Lemmings, LPN [facility] Nurse Health Advisor Direct Dial [phone]

## 2023-10-02 NOTE — Transitions of Care (Post Inpatient/ED Visit) (Signed)
   10/02/2023  Name: GHALIA REICKS MRN: [id number] DOB: 1950/09/29  Today's TOC FU Call Status: Today's TOC FU Call Status:: Unsuccessful Call (3rd Attempt) Unsuccessful Call (1st Attempt) Date: 09/30/23 Unsuccessful Call (2nd Attempt) Date: 10/01/23 Unsuccessful Call (3rd Attempt) Date: 10/02/23  Attempted to reach the patient regarding the most recent Inpatient/ED visit.  Follow Up Plan: No further outreach attempts will be made at this time. We have been unable to contact the patient.  Signature Julian Lemmings, LPN [facility] Nurse Health Advisor Direct Dial [phone]

## 2024-03-06 ENCOUNTER — Ambulatory Visit: Payer: Self-pay | Admitting: Family Medicine

## 2024-03-06 NOTE — Telephone Encounter (Signed)
 FYI Only or Action Required?: Action required by provider: clinical question for provider. Declines appt. Would like to know if something can be prescribed for her to [id number] - [place], [place] - [address] AT [facility] [address] [facility]    Patient was last seen in primary care on 05/02/2023 by Sharma Coyer, MD.  Called Nurse Triage reporting Cough.  Symptoms began several days ago.  Interventions attempted: OTC medications: Coricidin HBP.  Symptoms are: gradually worsening.  Triage Disposition: See Physician Within 24 Hours  Patient/caregiver understands and will follow disposition?: No, wishes to speak with PCP    2 days ago onset of cough with clear mucus, chest congestion, runny nose, fever and moderate diffuse chest tightness. No CP. No SOB. Temp 101 F oral yesterday.   Offered to schedule appt today. Pt declines, states she doesn't feel like coming in and is wanting to know if something can be prescribed. Advised appt normally needed before something can be prescribed. Pt would still like to see if something can be ordered. Forwarding request to office. Advised UC or ED for worsening symptoms.    Message from Deaijah H sent at 03/06/2024  2:31 PM EST  Reason for Triage: Possible flu bad chest cold, a lot of phlegm, fever on and off. Pain sometimes. Productive cough that's making her sore.   Reason for Disposition  SEVERE coughing spells (e.g., whooping sound after coughing, vomiting after coughing)    Moderate cough with fever and associated chest tightness  Answer Assessment - Initial Assessment Questions 1. ONSET: When did the cough begin?      2 days ago 2. SEVERITY: How bad is the cough today?      Moderate 3. SPUTUM: Describe the color of your sputum (e.g., none, dry cough; clear, white, yellow, green)     Clear 4. HEMOPTYSIS: Are you coughing up any blood? If Yes, ask: How much? (e.g., flecks, streaks, tablespoons, etc.)      No 5. DIFFICULTY BREATHING: Are you having difficulty breathing? If Yes, ask: How bad is it? (e.g., mild, moderate, severe)      No 6. FEVER: Do you have a fever? If Yes, ask: What is your temperature, how was it measured, and when did it start?     101 F oral yesterday 7. CARDIAC HISTORY: Do you have any history of heart disease? (e.g., heart attack, congestive heart failure)      CVA 8. LUNG HISTORY: Do you have any history of lung disease?  (e.g., pulmonary embolus, asthma, emphysema)     No 9. PE RISK FACTORS: Do you have a history of blood clots? (or: recent major surgery, recent prolonged travel, bedridden)     No 10. OTHER SYMPTOMS: Do you have any other symptoms? (e.g., runny nose, wheezing, chest pain)       Chest congestion, runny nose, diffuse chest tightness  Protocols used: Cough - Acute Productive-A-AH

## 2024-03-06 NOTE — Telephone Encounter (Signed)
 Please schedule patient for virtual visit with next provider if she is not able/willing to come in for  an exam and testing for COVID/influenza
